# Patient Record
Sex: Female | Born: 1935
Health system: Southern US, Community
[De-identification: ages and names within clinical notes are randomized; demographics above are authoritative.]

## PROBLEM LIST (undated history)

## (undated) DIAGNOSIS — I10 Essential (primary) hypertension: Secondary | ICD-10-CM

## (undated) DIAGNOSIS — E785 Hyperlipidemia, unspecified: Secondary | ICD-10-CM

## (undated) DIAGNOSIS — F329 Major depressive disorder, single episode, unspecified: Secondary | ICD-10-CM

## (undated) DIAGNOSIS — T7840XA Allergy, unspecified, initial encounter: Secondary | ICD-10-CM

## (undated) DIAGNOSIS — D649 Anemia, unspecified: Secondary | ICD-10-CM

## (undated) DIAGNOSIS — F32A Depression, unspecified: Secondary | ICD-10-CM

## (undated) HISTORY — PX: OTHER SURGICAL HISTORY: SHX169

## (undated) HISTORY — DX: Depression, unspecified: F32.A

## (undated) HISTORY — DX: Anemia, unspecified: D64.9

## (undated) HISTORY — DX: Hyperlipidemia, unspecified: E78.5

## (undated) HISTORY — PX: CHOLECYSTECTOMY: SHX55

## (undated) HISTORY — DX: Allergy, unspecified, initial encounter: T78.40XA

## (undated) HISTORY — PX: CORNEAL TRANSPLANT: SHX108

## (undated) HISTORY — PX: DILATION AND CURETTAGE OF UTERUS: SHX78

## (undated) HISTORY — PX: RETINAL DETACHMENT SURGERY: SHX105

## (undated) HISTORY — DX: Major depressive disorder, single episode, unspecified: F32.9

## (undated) HISTORY — DX: Essential (primary) hypertension: I10

---

## 2010-08-17 ENCOUNTER — Encounter: Payer: Self-pay | Admitting: *Deleted

## 2010-10-07 ENCOUNTER — Ambulatory Visit (INDEPENDENT_AMBULATORY_CARE_PROVIDER_SITE_OTHER): Payer: Medicare Other | Admitting: Family Medicine

## 2010-10-07 ENCOUNTER — Encounter: Payer: Self-pay | Admitting: Family Medicine

## 2010-10-07 DIAGNOSIS — F329 Major depressive disorder, single episode, unspecified: Secondary | ICD-10-CM

## 2010-10-07 DIAGNOSIS — I10 Essential (primary) hypertension: Secondary | ICD-10-CM

## 2010-10-07 DIAGNOSIS — H409 Unspecified glaucoma: Secondary | ICD-10-CM

## 2010-10-07 DIAGNOSIS — H6690 Otitis media, unspecified, unspecified ear: Secondary | ICD-10-CM

## 2010-10-07 MED ORDER — AZITHROMYCIN 250 MG PO TABS: 250.0000 mg | ORAL_TABLET | Freq: Every day | ORAL | Status: AC

## 2010-10-07 MED ORDER — BENZONATATE 200 MG PO CAPS: 200.0000 mg | ORAL_CAPSULE | Freq: Three times a day (TID) | ORAL | Status: DC | PRN

## 2010-10-07 NOTE — Patient Instructions (Signed)
Schedule your complete physical at your convenience- do not eat before this appt Schedule a nurse visit for your flu shot once you're feeling better Take the Azithromycin as directed for the ear infection Use the Tessalon as needed for cough Add Mucinex to thin your congestion Drink plenty of fluids REST! Welcome!  We're glad to have you!!!

## 2010-10-07 NOTE — Progress Notes (Signed)
  Subjective:    Patient ID: Samantha Briggs, female    DOB: 10-Jun-1932, 77 y.o.   MRN: [id number]  HPI New to establish.  Recently moved from [place], [place].  HTN- chronic problem, on Norvasc, Diovan HCT.  No CP, SOB, edema, HAs, visual changes.  Well controlled.  Depression- chronic problem, sxs well controlled on Zoloft.  Has previously weaned w/ recurrence of sxs.  Glaucoma- new problem for pt, s/p cornea transplants, sees specialist at [facility].  On meds daily.  URI- R ear pain, cough productive of mucous- 'i sit and cough for hrs'.  Denies facial pain/pressure.  No fevers.  sxs started over a week ago.  + sick contacts.  Review of Systems For ROS see HPI     Objective:   Physical Exam  Vitals reviewed. Constitutional: She is oriented to person, place, and time. She appears well-developed and well-nourished. No distress.  HENT:  Head: Normocephalic and atraumatic.  Nose: Nose normal.  Mouth/Throat: Oropharynx is clear and moist. No oropharyngeal exudate.       R TM opaque, poor landmarks L TM WNL No TTP over sinuses  Eyes: Conjunctivae and EOM are normal. Pupils are equal, round, and reactive to light.  Neck: Normal range of motion. Neck supple. No thyromegaly present.  Cardiovascular: Normal rate, regular rhythm, normal heart sounds and intact distal pulses.   No murmur heard. Pulmonary/Chest: Effort normal and breath sounds normal. No respiratory distress. She has no wheezes. She has no rales.  Abdominal: Soft. She exhibits no distension. There is no tenderness.  Musculoskeletal: She exhibits no edema.  Lymphadenopathy:    She has no cervical adenopathy.  Neurological: She is alert and oriented to person, place, and time.  Skin: Skin is warm and dry.  Psychiatric: She has a normal mood and affect. Her behavior is normal.          Assessment & Plan:

## 2010-10-17 DIAGNOSIS — I10 Essential (primary) hypertension: Secondary | ICD-10-CM | POA: Insufficient documentation

## 2010-10-17 DIAGNOSIS — F418 Other specified anxiety disorders: Secondary | ICD-10-CM | POA: Insufficient documentation

## 2010-10-17 DIAGNOSIS — H409 Unspecified glaucoma: Secondary | ICD-10-CM | POA: Insufficient documentation

## 2010-10-17 DIAGNOSIS — H6690 Otitis media, unspecified, unspecified ear: Secondary | ICD-10-CM | POA: Insufficient documentation

## 2010-10-17 NOTE — Assessment & Plan Note (Signed)
R OM.  Start Azithromycin.  Reviewed supportive care and red flags that should prompt return.  Pt expressed understanding and is in agreement w/ plan.

## 2010-10-17 NOTE — Assessment & Plan Note (Signed)
Chronic issue for pt.  Has attempted to wean of SSRI previously w/ recurrence of sxs.  Reports sxs are well controlled on current dose.  No changes.

## 2010-10-17 NOTE — Assessment & Plan Note (Signed)
Chronic problem.  Asymptomatic.  Well controlled.  No changes in meds.

## 2010-10-17 NOTE — Assessment & Plan Note (Signed)
New problem for pt.  Following w/ eye specialist at [facility].  Will follow along and assist as able.

## 2010-11-04 ENCOUNTER — Ambulatory Visit (INDEPENDENT_AMBULATORY_CARE_PROVIDER_SITE_OTHER): Payer: Medicare Other | Admitting: Family Medicine

## 2010-11-04 ENCOUNTER — Encounter: Payer: Self-pay | Admitting: Family Medicine

## 2010-11-04 DIAGNOSIS — J329 Chronic sinusitis, unspecified: Secondary | ICD-10-CM

## 2010-11-04 DIAGNOSIS — R197 Diarrhea, unspecified: Secondary | ICD-10-CM

## 2010-11-04 MED ORDER — CLARITHROMYCIN ER 500 MG PO TB24: 1000.0000 mg | ORAL_TABLET | Freq: Every day | ORAL | Status: DC

## 2010-11-04 NOTE — Patient Instructions (Signed)
You have a sinus infection Take the Biaxin- 2 tabs at the same time- w/ food Start the nasal spray- 2 sprays each nostril daily Drink plenty of fluids Hold the allergy pills- this may be causing diarrhea Call with any questions or concerns Hang in there!

## 2010-11-04 NOTE — Progress Notes (Signed)
  Subjective:    Patient ID: Samantha Briggs, female    DOB: 1932/12/24, 77 y.o.   MRN: [id number]  HPI ?URI- sxs started w/ a sore throat and then progressed to cough and ear pressure.  Now nasal congestion, body aches.  No fevers.  + facial pressure.  + PND.  Nausea/Diarrhea- pt recently started OTC allergy pill.  Inactive ingredients include polyethylene glycol, magnesium.   Review of Systems For ROS see HPI     Objective:   Physical Exam  Vitals reviewed. Constitutional: She appears well-developed and well-nourished. No distress.  HENT:  Head: Normocephalic and atraumatic.  Right Ear: Tympanic membrane normal.  Left Ear: Tympanic membrane normal.  Nose: Mucosal edema and rhinorrhea present. Right sinus exhibits maxillary sinus tenderness and frontal sinus tenderness. Left sinus exhibits maxillary sinus tenderness and frontal sinus tenderness.  Mouth/Throat: Uvula is midline and mucous membranes are normal. Posterior oropharyngeal erythema present. No oropharyngeal exudate.  Eyes: Conjunctivae and EOM are normal. Pupils are equal, round, and reactive to light.  Neck: Normal range of motion. Neck supple.  Cardiovascular: Normal rate, regular rhythm and normal heart sounds.   Pulmonary/Chest: Effort normal and breath sounds normal. No respiratory distress. She has no wheezes.  Lymphadenopathy:    She has no cervical adenopathy.          Assessment & Plan:

## 2010-11-05 DIAGNOSIS — J329 Chronic sinusitis, unspecified: Secondary | ICD-10-CM | POA: Insufficient documentation

## 2010-11-05 NOTE — Assessment & Plan Note (Signed)
Pt's sxs and PE consistent w/ infxn.  Start biaxin.  Reviewed supportive care and red flags that should prompt return.  Pt expressed understanding and is in agreement w/ plan.

## 2010-11-05 NOTE — Assessment & Plan Note (Signed)
Likely multifactorial- current infxn, new medicine w/ ingredients that stimulate bowels.  Encouraged her to hold allergy med temporarily while treating infection and then rechallenge after abx course.  If diarrhea recurs- pt to stop meds indefinitely.  Pt expressed understanding and is in agreement w/ plan.

## 2010-11-08 ENCOUNTER — Ambulatory Visit: Payer: Self-pay | Admitting: Internal Medicine

## 2010-12-06 ENCOUNTER — Ambulatory Visit (INDEPENDENT_AMBULATORY_CARE_PROVIDER_SITE_OTHER): Payer: Medicare Other | Admitting: Family Medicine

## 2010-12-06 ENCOUNTER — Encounter: Payer: Self-pay | Admitting: Family Medicine

## 2010-12-06 DIAGNOSIS — R5383 Other fatigue: Secondary | ICD-10-CM | POA: Insufficient documentation

## 2010-12-06 DIAGNOSIS — L853 Xerosis cutis: Secondary | ICD-10-CM | POA: Insufficient documentation

## 2010-12-06 DIAGNOSIS — Z862 Personal history of diseases of the blood and blood-forming organs and certain disorders involving the immune mechanism: Secondary | ICD-10-CM

## 2010-12-06 DIAGNOSIS — M79609 Pain in unspecified limb: Secondary | ICD-10-CM

## 2010-12-06 DIAGNOSIS — R5381 Other malaise: Secondary | ICD-10-CM

## 2010-12-06 DIAGNOSIS — R238 Other skin changes: Secondary | ICD-10-CM

## 2010-12-06 DIAGNOSIS — M79606 Pain in leg, unspecified: Secondary | ICD-10-CM | POA: Insufficient documentation

## 2010-12-06 MED ORDER — CLONAZEPAM 0.5 MG PO TABS: 0.5000 mg | ORAL_TABLET | Freq: Two times a day (BID) | ORAL | Status: DC

## 2010-12-06 NOTE — Progress Notes (Signed)
  Subjective:    Patient ID: Forrest Moron, female    DOB: 25-Aug-1932, 77 y.o.   MRN: [id number]  HPI Leg pain- reports she had similar sxs 20 yrs ago while on vacation.  Reports intermittent 'prickly hot' feeling.  More noticeable when tired or increased activity.  sxs worse at night.  Has constant need to move legs.  Will have cramping.  sxs bilaterally.  Increased fatigue- reports hx of iron deficiency anemia.  Has had increased activity lately w/ excessive shopping, waiting in long lines, poor sleep.  Chewing on ice.  Dry skin- very itchy, taking benadryl prn.   Review of Systems For ROS see HPI     Objective:   Physical Exam  Vitals reviewed. Constitutional: She is oriented to person, place, and time. She appears well-developed and well-nourished. No distress.  HENT:  Head: Normocephalic and atraumatic.  Eyes: Conjunctivae and EOM are normal. Pupils are equal, round, and reactive to light.  Neck: Normal range of motion. Neck supple. No thyromegaly present.  Cardiovascular: Normal rate, regular rhythm, normal heart sounds and intact distal pulses.   No murmur heard. Pulmonary/Chest: Effort normal and breath sounds normal. No respiratory distress.  Abdominal: Soft. She exhibits no distension. There is no tenderness.  Musculoskeletal: She exhibits no edema and no tenderness.  Lymphadenopathy:    She has no cervical adenopathy.  Neurological: She is alert and oriented to person, place, and time.  Skin: Skin is warm and dry.  Psychiatric: She has a normal mood and affect. Her behavior is normal.          Assessment & Plan:

## 2010-12-06 NOTE — Patient Instructions (Signed)
We'll notify you of your lab results and decide the next step Make sure you keep your skin well moisturized (try Aveeno) Increase your water intake Take the Klonopin nightly for restless leg Call with any questions or concerns Happy Holidays!

## 2010-12-07 LAB — IBC PANEL
Iron: 32 ug/dL — ABNORMAL LOW (ref 42–145)
Transferrin: 430.6 mg/dL — ABNORMAL HIGH (ref 212.0–360.0)

## 2010-12-07 LAB — BASIC METABOLIC PANEL
BUN: 18 mg/dL (ref 6–23)
CO2: 23 mEq/L (ref 19–32)
Calcium: 8.7 mg/dL (ref 8.4–10.5)
Chloride: 105 mEq/L (ref 96–112)
Creatinine, Ser: 1 mg/dL (ref 0.4–1.2)
GFR: 60.07 mL/min (ref >60.00)
Glucose, Bld: 135 mg/dL — ABNORMAL HIGH (ref 70–99)
Potassium: 3.8 mEq/L (ref 3.5–5.1)
Sodium: 140 mEq/L (ref 135–145)

## 2010-12-07 LAB — CBC WITH DIFFERENTIAL/PLATELET
Basophils Relative: 0.1 % (ref 0.0–3.0)
Eosinophils Relative: 1.3 % (ref 0.0–5.0)
MCV: 71.8 fl — ABNORMAL LOW (ref 78.0–100.0)
Monocytes Absolute: 0.5 10*3/uL (ref 0.1–1.0)
Monocytes Relative: 9.2 % (ref 3.0–12.0)
Neutrophils Relative %: 43.7 % (ref 43.0–77.0)
Platelets: 249 10*3/uL (ref 150.0–400.0)
RBC: 4.57 Mil/uL (ref 3.87–5.11)
WBC: 5.9 10*3/uL (ref 4.5–10.5)

## 2010-12-07 LAB — TSH: TSH: 4.12 u[IU]/mL (ref 0.35–5.50)

## 2010-12-08 ENCOUNTER — Ambulatory Visit (HOSPITAL_BASED_OUTPATIENT_CLINIC_OR_DEPARTMENT_OTHER)
Admission: RE | Admit: 2010-12-08 | Discharge: 2010-12-08 | Disposition: A | Discharge status: Home / Self Care | Payer: Medicare Other | Source: Ambulatory Visit | Attending: Family Medicine | Admitting: Family Medicine

## 2010-12-08 ENCOUNTER — Encounter: Payer: Self-pay | Admitting: Family Medicine

## 2010-12-08 ENCOUNTER — Encounter: Payer: Self-pay | Admitting: *Deleted

## 2010-12-08 ENCOUNTER — Ambulatory Visit (INDEPENDENT_AMBULATORY_CARE_PROVIDER_SITE_OTHER): Payer: Medicare Other | Admitting: Family Medicine

## 2010-12-08 VITALS — BP 125/80 | HR 95 | Temp 99.9°F | Ht 67.0 in | Wt 218.4 lb

## 2010-12-08 DIAGNOSIS — R059 Cough, unspecified: Secondary | ICD-10-CM

## 2010-12-08 DIAGNOSIS — R05 Cough: Secondary | ICD-10-CM

## 2010-12-08 MED ORDER — BENZONATATE 200 MG PO CAPS: 200.0000 mg | ORAL_CAPSULE | Freq: Three times a day (TID) | ORAL | Status: AC | PRN

## 2010-12-08 MED ORDER — FERROUS SULFATE 325 (65 FE) MG PO TABS: 325.0000 mg | ORAL_TABLET | Freq: Every day | ORAL | Status: DC

## 2010-12-08 NOTE — Progress Notes (Signed)
  Subjective:    Patient ID: Samantha Briggs, female    DOB: 1932/02/28, 77 y.o.   MRN: [id number]  HPI Cough- sxs started yesterday.  Coughed all night, + chest congestion.  + body aches, HA.  No fevers at home.  + sick contacts.  No ear pain, sinus pressure.   Review of Systems For ROS see HPI     Objective:   Physical Exam  Vitals reviewed. Constitutional: She appears well-developed and well-nourished. No distress.  HENT:  Head: Normocephalic and atraumatic.       TMs normal bilaterally Mild nasal congestion Throat w/out erythema, edema, or exudate  Eyes: Conjunctivae and EOM are normal. Pupils are equal, round, and reactive to light.  Neck: Normal range of motion. Neck supple.  Cardiovascular: Normal rate, regular rhythm, normal heart sounds and intact distal pulses.   No murmur heard. Pulmonary/Chest: Effort normal and breath sounds normal. No respiratory distress. She has no wheezes.       + hacking cough  Lymphadenopathy:    She has no cervical adenopathy.          Assessment & Plan:

## 2010-12-08 NOTE — Assessment & Plan Note (Signed)
Pt's sxs consistent w/ flu like illness but rapid flu was negative.  Will send for CXR prior to starting abx.  Cough meds given.  Will determine next step based on xray results

## 2010-12-08 NOTE — Patient Instructions (Signed)
Go to [address] to get your xray- we'll call you with the results Use the cough meds as needed Alternate tylenol and ibuprofen for body aches and fever REST! Drink plenty of fluids Hang in there!

## 2010-12-10 ENCOUNTER — Telehealth: Payer: Self-pay

## 2010-12-10 MED ORDER — AZITHROMYCIN 250 MG PO TABS: ORAL_TABLET | ORAL | Status: DC

## 2010-12-10 NOTE — Telephone Encounter (Signed)
Call from patient and she was diagnosed with a viral infection, she stated she still has a bad cough with discolored mucus and wheezing and she wanted to get something.  Denied a fever.    KP

## 2010-12-10 NOTE — Telephone Encounter (Signed)
Pt had a negative chest xray 2 days ago.  Viral infections typically last 7-10 days and often worsen before they improve (w/ day 3-5 typically being the worst).  Change in mucous does not indicate bacterial infxn.  Since the weekend is coming, will give Zpack but there is a chance this will not improve her sxs.  Should be taking Mucinex to thin her congestion.  If increased wheezing or shortness of breath she should go to ER, UC or Saturday clinic

## 2010-12-10 NOTE — Telephone Encounter (Signed)
Left detailed message for pt ; Pt had a negative chest xray 2 days ago. Viral infections typically last 7-10 days and often worsen before they improve (w/ day 3-5 typically being the worst). Change in mucous does not indicate bacterial infxn. Since the weekend is coming, will give Zpack but there is a chance this will not improve her sxs. Should be taking Mucinex to thin her congestion. If increased wheezing or shortness of breath she should go to ER, UC or Saturday clinic advised for pt to call office with any questions. Sent z-pack to pharmacy in chart

## 2010-12-14 ENCOUNTER — Telehealth: Payer: Self-pay | Admitting: *Deleted

## 2010-12-14 NOTE — Assessment & Plan Note (Signed)
Most likely seasonal.  Needs increased moisturizing.  Pt expressed understanding and is in agreement w/ plan.

## 2010-12-14 NOTE — Assessment & Plan Note (Signed)
Pt's sxs consistent w/ restless leg.  Start klonopin nightly to improve sxs.  Pt to call if no improvement.  Pt expressed understanding and is in agreement w/ plan.

## 2010-12-14 NOTE — Telephone Encounter (Signed)
Pt left vm to ask when she can expect her cough to subside and when she can have the flu vaccine. Spoke to pt to pt and she understood and will continue with her ABT and call in to schedule for her flu vaccine per verbal order from MD Beverely Low

## 2010-12-14 NOTE — Assessment & Plan Note (Signed)
Pt's sxs consistent w/ iron deficiency anemia.  Check labs to confirm.  Will determine next step based on lab results.  Reviewed supportive care and red flags that should prompt return.  Pt expressed understanding and is in agreement w/ plan.

## 2010-12-16 ENCOUNTER — Telehealth: Payer: Self-pay | Admitting: *Deleted

## 2010-12-16 NOTE — Telephone Encounter (Signed)
Is she taking these on an empty stomach?  If so- she should try taking them w/ food.

## 2010-12-16 NOTE — Telephone Encounter (Signed)
Noted 30 minutes after she takes iron pills her hands shake, her stomach feels funny and she sweats. Should she stop taking or take something different?

## 2010-12-16 NOTE — Telephone Encounter (Signed)
.  left message to have patient return my call.  

## 2010-12-21 ENCOUNTER — Ambulatory Visit: Payer: Medicare Other

## 2010-12-21 ENCOUNTER — Telehealth: Payer: Self-pay | Admitting: Family Medicine

## 2010-12-21 ENCOUNTER — Other Ambulatory Visit (INDEPENDENT_AMBULATORY_CARE_PROVIDER_SITE_OTHER): Payer: Medicare Other

## 2010-12-21 DIAGNOSIS — N390 Urinary tract infection, site not specified: Secondary | ICD-10-CM

## 2010-12-21 LAB — POCT URINALYSIS DIPSTICK
Bilirubin, UA: NEGATIVE
Glucose, UA: NEGATIVE
Ketones, UA: NEGATIVE
Nitrite, UA: NEGATIVE

## 2010-12-21 MED ORDER — CEPHALEXIN 500 MG PO CAPS: 500.0000 mg | ORAL_CAPSULE | Freq: Two times a day (BID) | ORAL | Status: AC

## 2010-12-21 MED ORDER — FLUCONAZOLE 150 MG PO TABS: 150.0000 mg | ORAL_TABLET | Freq: Once | ORAL | Status: DC

## 2010-12-21 NOTE — Telephone Encounter (Signed)
Last OV 12-08-10

## 2010-12-21 NOTE — Telephone Encounter (Signed)
Pt came in for office visit and I asked if she had received my message about the iron supplement, pt advised that she did and thinks she was not eating enough food and will call if any concerns start up again per has not noted any issues since she started eating more food

## 2010-12-21 NOTE — Telephone Encounter (Signed)
Patient states that she was given antibiotics at last visit and now has a yeast infection and is having trouble urinating.

## 2010-12-21 NOTE — Telephone Encounter (Signed)
Spoke to pt to advise results/instructions. Pt understood. Scheduled pt to come in today for a urine sample Sent diflucan in for pt to pick up pt is aware.

## 2010-12-21 NOTE — Telephone Encounter (Signed)
Is she able to come in an leave a urine sample? We can call in diflucan 150  #2  1 po qd x1, may repeat in 2 days prn

## 2010-12-22 ENCOUNTER — Ambulatory Visit: Payer: Medicare Other

## 2010-12-22 ENCOUNTER — Telehealth: Payer: Self-pay | Admitting: Family Medicine

## 2010-12-22 NOTE — Telephone Encounter (Signed)
.  left message to have patient return my call. Advised on vm to NOT take the flu shot per still on antibiotics

## 2010-12-22 NOTE — Telephone Encounter (Signed)
Patient states that she has appt this afternoon for flu shot. She has a bladder and yeast infection and would like to know if she should wait to get her flu shot?

## 2010-12-23 ENCOUNTER — Encounter: Payer: Self-pay | Admitting: *Deleted

## 2010-12-23 ENCOUNTER — Ambulatory Visit (INDEPENDENT_AMBULATORY_CARE_PROVIDER_SITE_OTHER): Payer: Medicare Other | Admitting: Family Medicine

## 2010-12-23 ENCOUNTER — Encounter: Payer: Self-pay | Admitting: Family Medicine

## 2010-12-23 ENCOUNTER — Other Ambulatory Visit: Payer: Self-pay | Admitting: *Deleted

## 2010-12-23 ENCOUNTER — Ambulatory Visit (HOSPITAL_BASED_OUTPATIENT_CLINIC_OR_DEPARTMENT_OTHER)
Admission: RE | Admit: 2010-12-23 | Discharge: 2010-12-23 | Disposition: A | Discharge status: Home / Self Care | Payer: Medicare Other | Source: Ambulatory Visit | Attending: Family Medicine | Admitting: Family Medicine

## 2010-12-23 DIAGNOSIS — R05 Cough: Secondary | ICD-10-CM

## 2010-12-23 DIAGNOSIS — R197 Diarrhea, unspecified: Secondary | ICD-10-CM

## 2010-12-23 DIAGNOSIS — N390 Urinary tract infection, site not specified: Secondary | ICD-10-CM | POA: Insufficient documentation

## 2010-12-23 DIAGNOSIS — R042 Hemoptysis: Secondary | ICD-10-CM

## 2010-12-23 DIAGNOSIS — I517 Cardiomegaly: Secondary | ICD-10-CM

## 2010-12-23 DIAGNOSIS — R059 Cough, unspecified: Secondary | ICD-10-CM | POA: Insufficient documentation

## 2010-12-23 MED ORDER — LEVOFLOXACIN 500 MG PO TABS: 500.0000 mg | ORAL_TABLET | Freq: Every day | ORAL | Status: AC

## 2010-12-23 MED ORDER — GUAIFENESIN-CODEINE 100-10 MG/5ML PO SYRP: 10.0000 mL | ORAL_SOLUTION | Freq: Three times a day (TID) | ORAL | Status: DC | PRN

## 2010-12-23 NOTE — Progress Notes (Signed)
  Subjective:    Patient ID: Samantha Briggs, female    DOB: 10-12-1932, 77 y.o.   MRN: [id number]  HPI Cough- reports she is now spitting up blood.  No fevers.  + nasal congestion, pressure- clear drainage.  No ear pain.  Had recent normal CXR.  Diarrhea- started when taking iron.  Pt reports she has had this problem since having gallbladder out.  UTI- UA and prelim cx show infxn.  On Keflex 500mg  bid.   Review of Systems For ROS see HPI     Objective:   Physical Exam  Vitals reviewed. Constitutional: She appears well-developed and well-nourished. No distress.  HENT:  Head: Normocephalic and atraumatic.       TMs normal bilaterally Mild nasal congestion Throat w/out erythema, edema, or exudate  Eyes: Conjunctivae and EOM are normal. Pupils are equal, round, and reactive to light.  Neck: Normal range of motion. Neck supple.  Cardiovascular: Normal rate, regular rhythm, normal heart sounds and intact distal pulses.   No murmur heard. Pulmonary/Chest: Effort normal and breath sounds normal. No respiratory distress. She has no wheezes.       + hacking cough  Lymphadenopathy:    She has no cervical adenopathy.          Assessment & Plan:

## 2010-12-23 NOTE — Patient Instructions (Signed)
[facility] to [facility] Use the cough syrup as needed- this may make you sleepy Continue the Keflex for the bladder infection Add mucinex to thin your congestion Drink plenty of fluids REST! Hang in there!!! Happy Holidays!

## 2010-12-23 NOTE — Telephone Encounter (Signed)
rx sent to pharmacy by e-script  

## 2010-12-24 ENCOUNTER — Telehealth: Payer: Self-pay

## 2010-12-24 LAB — URINE CULTURE: Colony Count: >=100000

## 2010-12-24 NOTE — Telephone Encounter (Signed)
Call from patient and she stated she was given Levaquin and it kept her up all night and she wanted to get something milder, she felt like the Levaquin was too strong. Please advise       KP

## 2010-12-24 NOTE — Telephone Encounter (Signed)
Discussed with patient and she voiced understanding.      KP 

## 2010-12-24 NOTE — Telephone Encounter (Signed)
The antibiotics would not have kept her up all night- especially after 1 dose.  Sometimes codeine can have the opposite effect on people and rather than making them sleepy- it can make them hyper.  There is no reason to change abx at this time.

## 2011-01-09 NOTE — Assessment & Plan Note (Signed)
Continue keflex for now.  If CXR shows lung process will switch abx to cover both urine and chest.  Pt expressed understanding and is in agreement w/ plan.

## 2011-01-09 NOTE — Assessment & Plan Note (Signed)
Pt feels this is due to her recent restart of iron.  Given her multiple abx recently will hold iron and restart once she is feeling better.  Pt expressed understanding and is in agreement w/ plan.

## 2011-01-09 NOTE — Assessment & Plan Note (Signed)
Deteriorated.  Given reports of blood tinged sputum will get CXR to assess.  Hold on abx decision until results available.  Pt expressed understanding and is in agreement w/ plan.

## 2011-02-22 ENCOUNTER — Telehealth: Payer: Self-pay | Admitting: Family Medicine

## 2011-02-22 NOTE — Telephone Encounter (Signed)
Patient states she is being treated at [facility] and as of January this clinic is no longer in-network for her insurance with [facility]. [employer] has requested that in order for her treatment to be covered, they need Dr. Beverely Low to call ([phone]) and confirm that the patient should be treated at [facility]. Patient has an appointment on this Thursday 2-21 and has requested a quick response.

## 2011-02-23 ENCOUNTER — Telehealth: Payer: Self-pay | Admitting: *Deleted

## 2011-02-23 NOTE — Telephone Encounter (Signed)
Pt called to advise that she has an apt on 02-24-11 with MD Allingham at [facility] in [place] [phone]), pt advised that while transitioning from [facility], her MD at that time referred her to MD Allingham per she was to be moving to [place], she has established care and has been treated by MD Allingham for the entire year of 2012 and now he is no longer accepted from her insurance provider [employer] for 2013, pt is requesting that MD Tabori either call or send "word" to MD Allingham office that she is the pt PCP and advises that she can still receive care from MD Allingham, I asked if the pt is speaking of a referral, the pt stated yes. I advised that MD Beverely Low will be in the office this afternoon and that I will speak to her about the matter and give her a call back. Pt understood.

## 2011-02-23 NOTE — Telephone Encounter (Signed)
I placed call to [facility], was transferred to financial/insurance department, had to leave voicemail for [facility].  I explained on her voicemail the situation, and asked her to return my call and advise what they need from [place].

## 2011-02-23 NOTE — Telephone Encounter (Signed)
Per call back from [facility] with [facility], she did not tell the patient what the patient called and told us.  There must have some confusion is what she understood what was explained to her by [facility].  Per Delores, they do not need anything from [place], the patient has no "out of network" benefits, so if she continues her care with them, it will be completely out of pocket for her.    Per my call to patient to explain, she states it was not [employer] who needed a call from [place], it's her [employer], Medicare Completed.  The providers network department told patient, since she is already established & has pre-existing care with [facility], that our physician can call them at [phone], and they could make an exception, and pay for patient to continue care and have her benefits paid as normal.

## 2011-02-23 NOTE — Telephone Encounter (Signed)
It would make more sense for the specialist to call and get her this exception.  I'm not sure what info I have to offer since I'm not aware of her treatment plan.

## 2011-02-23 NOTE — Telephone Encounter (Signed)
Called pt and advised instructions per deloris from [facility], pt understood and will call the insurance company after her apt with the eye center tommorow, pt understood that she will have to pay out of pocket.

## 2011-02-23 NOTE — Telephone Encounter (Signed)
Not sure why pt would need a referral if she is already established w/ MD.  A referral from me will do nothing to change her insurance status.  I will be happy to send a referral asking him to continue her care but this will not change the $ situation.

## 2011-02-23 NOTE — Telephone Encounter (Signed)
Please advise 

## 2011-03-08 ENCOUNTER — Telehealth: Payer: Self-pay | Admitting: Family Medicine

## 2011-03-08 NOTE — Telephone Encounter (Signed)
rx refill for   sertraline (ZOLOFT) 50 MG tablet   Qty 30 Last fill date 1.21.13  [id number], fax # [phone]

## 2011-03-08 NOTE — Telephone Encounter (Signed)
RX refill for  Valsartan-HCTZ 320-25MG  Tab 30/each

## 2011-03-08 NOTE — Telephone Encounter (Signed)
Ok for #30, 6 refills 

## 2011-03-09 MED ORDER — VALSARTAN-HYDROCHLOROTHIAZIDE 320-25 MG PO TABS: 1.0000 | ORAL_TABLET | Freq: Every day | ORAL | Status: DC

## 2011-03-09 MED ORDER — SERTRALINE HCL 50 MG PO TABS: 50.0000 mg | ORAL_TABLET | Freq: Every day | ORAL | Status: DC

## 2011-03-09 NOTE — Telephone Encounter (Signed)
rx sent to pharmacy by e-script for zoloft

## 2011-03-09 NOTE — Telephone Encounter (Signed)
rx sent to pharmacy by e-script  

## 2011-03-09 NOTE — Telephone Encounter (Signed)
Addended by: Derry Lory A on: 03/09/2011 05:03 PM   Modules accepted: Orders

## 2011-03-21 ENCOUNTER — Telehealth: Payer: Self-pay | Admitting: Family Medicine

## 2011-03-21 NOTE — Telephone Encounter (Signed)
Refill: Amlodipine besylate 2.5mg  tab. Take 1 tablet by mouth every day. Qty 30. Last fill 2.14.13

## 2011-03-22 MED ORDER — AMLODIPINE BESYLATE 2.5 MG PO TABS: 2.5000 mg | ORAL_TABLET | Freq: Every day | ORAL | Status: DC

## 2011-04-21 ENCOUNTER — Telehealth: Payer: Self-pay | Admitting: Family Medicine

## 2011-04-21 MED ORDER — FERROUS SULFATE 325 (65 FE) MG PO TABS: 325.0000 mg | ORAL_TABLET | Freq: Every day | ORAL | Status: DC

## 2011-04-21 NOTE — Telephone Encounter (Signed)
Patient wants to know if she should come back in for a visit since she has been on iron pills and has not been in since 12/2010 Please advise & I can call to make appointment PH#[phone]

## 2011-04-21 NOTE — Telephone Encounter (Signed)
rx sent to pharmacy by e-script per MD Beverely Low, please advise [facility] [address]

## 2011-04-22 NOTE — Telephone Encounter (Signed)
Advised patient regarding RX and no need to be seen yet.

## 2011-05-03 ENCOUNTER — Encounter: Payer: Self-pay | Admitting: Internal Medicine

## 2011-05-03 ENCOUNTER — Ambulatory Visit (INDEPENDENT_AMBULATORY_CARE_PROVIDER_SITE_OTHER): Payer: Medicare Other | Admitting: Internal Medicine

## 2011-05-03 VITALS — BP 132/82 | HR 84 | Temp 98.2°F | Ht 67.5 in | Wt 214.2 lb

## 2011-05-03 DIAGNOSIS — M549 Dorsalgia, unspecified: Secondary | ICD-10-CM

## 2011-05-03 LAB — POCT URINALYSIS DIPSTICK
Bilirubin, UA: NEGATIVE
Ketones, UA: NEGATIVE
Leukocytes, UA: NEGATIVE
Nitrite, UA: POSITIVE

## 2011-05-03 MED ORDER — CYCLOBENZAPRINE HCL 10 MG PO TABS: 10.0000 mg | ORAL_TABLET | Freq: Every evening | ORAL | Status: AC | PRN

## 2011-05-03 NOTE — Patient Instructions (Signed)
Rest, no heavy lifting Tylenol 500 mg over-the-counter, 2 tablets every 6 hours as needed. Flexeril, a muscle relaxant, at night. Local heat Call if you get worse, you have fever or more stomach symptoms. Also call if not better in a few days.

## 2011-05-03 NOTE — Progress Notes (Signed)
  Subjective:    Patient ID: Samantha Briggs, female    DOB: [birth year], 78 y.o.   MRN: [id number]  HPI Acute visit Developed a back pain 3 days ago, initially on off, now is more steady. Located at the mid thoracic area, slightly worse on the right. Pain does not change when she bends-twist her torso or when she eats. Feels  like a spasm. No radiation, has not taken any medication for the pain. She also has occasional dry heaves however denies any heartburn, dysphagia or odynophagia. She noted that burping and rubbing the back makes the pain better.   Past Medical History  Diagnosis Date  . Anemia   . Depression   . Glaucoma   . Allergy   . Hypertension      Review of Systems No vomiting, no blood in the stools. Occasional diarrhea which is nothing new for her. No abdominal pain. No dysuria gross hematuria. Has not taken any recent antibiotics by mouth but rather strong eyedrop antibiotics.    Objective:   Physical Exam General -- alert, well-developed, and overweight appearing. No apparent distress.   abdomen--soft, non-tender, no distention, no masses, no HSM, no guarding, and no rigidity.   Extremities-- no pretibial edema bilaterally  Back-- no tender to palpation at the thoracic of lumbar spine, skin normal, question of right-sided CVA tenderness. Neurologic-- alert & oriented X3 and strength normal in all extremities. Psych-- Cognition and judgment appear intact. Alert and cooperative with normal attention span and concentration.  not anxious appearing and not depressed appearing.      Assessment & Plan:   Back pain: Has some atypical symptoms like decreasing with burping, no urinary symptoms however the urine dip shows some leukocytes. Plan: Treat as a muscular issue Send a urine culture Call if not better in  Few days. See  instructions.

## 2011-05-05 ENCOUNTER — Telehealth: Payer: Self-pay | Admitting: Family Medicine

## 2011-05-05 LAB — CULTURE, URINE COMPREHENSIVE

## 2011-05-05 MED ORDER — CEPHALEXIN 500 MG PO CAPS: 500.0000 mg | ORAL_CAPSULE | Freq: Two times a day (BID) | ORAL | Status: AC

## 2011-05-05 NOTE — Telephone Encounter (Signed)
Tried to call Pt no answer and VM not set up will try again later.

## 2011-05-05 NOTE — Progress Notes (Signed)
Addended by: Derry Lory A on: 05/05/2011 01:51 PM   Modules accepted: Orders

## 2011-05-05 NOTE — Telephone Encounter (Signed)
Start OTC prilosec 20 mg 1 po qd, arrange a OV for tomorrow, needs to be reassessed

## 2011-05-05 NOTE — Telephone Encounter (Signed)
Pt states that symptoms are not as intense but have change very little since OV. Marland KitchenPlease advise

## 2011-05-05 NOTE — Telephone Encounter (Signed)
Still having problems with gas pains, has seen Dr.Pax this week. Wants to know if there is anything at all that can be done to comfort her problem Patient ph# [phone]

## 2011-05-06 NOTE — Telephone Encounter (Signed)
Tried to call Pt No answer, VM not set up will try again later.

## 2011-05-08 ENCOUNTER — Emergency Department (HOSPITAL_BASED_OUTPATIENT_CLINIC_OR_DEPARTMENT_OTHER)
Admission: EM | Admit: 2011-05-08 | Discharge: 2011-05-08 | Disposition: A | Discharge status: Home / Self Care | Payer: Medicare Other | Attending: Emergency Medicine | Admitting: Emergency Medicine

## 2011-05-08 ENCOUNTER — Encounter (HOSPITAL_BASED_OUTPATIENT_CLINIC_OR_DEPARTMENT_OTHER): Payer: Self-pay | Admitting: Emergency Medicine

## 2011-05-08 DIAGNOSIS — F329 Major depressive disorder, single episode, unspecified: Secondary | ICD-10-CM | POA: Insufficient documentation

## 2011-05-08 DIAGNOSIS — Z87891 Personal history of nicotine dependence: Secondary | ICD-10-CM | POA: Insufficient documentation

## 2011-05-08 DIAGNOSIS — Z9089 Acquired absence of other organs: Secondary | ICD-10-CM | POA: Insufficient documentation

## 2011-05-08 DIAGNOSIS — Z7982 Long term (current) use of aspirin: Secondary | ICD-10-CM | POA: Insufficient documentation

## 2011-05-08 DIAGNOSIS — F3289 Other specified depressive episodes: Secondary | ICD-10-CM | POA: Insufficient documentation

## 2011-05-08 DIAGNOSIS — D649 Anemia, unspecified: Secondary | ICD-10-CM | POA: Insufficient documentation

## 2011-05-08 DIAGNOSIS — B029 Zoster without complications: Secondary | ICD-10-CM | POA: Insufficient documentation

## 2011-05-08 DIAGNOSIS — Z885 Allergy status to narcotic agent status: Secondary | ICD-10-CM | POA: Insufficient documentation

## 2011-05-08 DIAGNOSIS — H409 Unspecified glaucoma: Secondary | ICD-10-CM | POA: Insufficient documentation

## 2011-05-08 DIAGNOSIS — I10 Essential (primary) hypertension: Secondary | ICD-10-CM | POA: Insufficient documentation

## 2011-05-08 MED ORDER — PREDNISONE 20 MG PO TABS: ORAL_TABLET | ORAL | Status: AC

## 2011-05-08 MED ORDER — ONDANSETRON HCL 8 MG PO TABS: 8.0000 mg | ORAL_TABLET | Freq: Three times a day (TID) | ORAL | Status: AC | PRN

## 2011-05-08 MED ORDER — VALACYCLOVIR HCL 1 G PO TABS: 1000.0000 mg | ORAL_TABLET | Freq: Two times a day (BID) | ORAL | Status: DC

## 2011-05-08 MED ORDER — OXYCODONE-ACETAMINOPHEN 5-325 MG PO TABS
1.0000 | ORAL_TABLET | ORAL | Status: AC | PRN
Start: 2011-05-08 — End: 2011-05-18

## 2011-05-08 NOTE — Discharge Instructions (Signed)

## 2011-05-08 NOTE — ED Provider Notes (Signed)
History     CSN: [id number]  Arrival date & time 05/08/11  4782   First MD Initiated Contact with Patient 05/08/11 [phone]      Chief Complaint  Patient presents with  . Back Pain  . Rash    (Consider location/radiation/quality/duration/timing/severity/associated sxs/prior treatment) Patient is a 78 y.o. female presenting with rash. The history is provided by the patient.  Rash  This is a new problem. The current episode started yesterday (pain for the last week but rash appeared yesterday). The problem has been gradually worsening. The problem is associated with nothing. There has been no fever. The rash is present on the torso. The pain is at a severity of 8/10. The pain is severe. The pain has been constant since onset. Associated symptoms include blisters and pain. She has tried nothing for the symptoms. The treatment provided no relief.    Past Medical History  Diagnosis Date  . Anemia   . Depression   . Glaucoma   . Allergy   . Hypertension     Past Surgical History  Procedure Date  . Cholecystectomy   . Dilation and curettage of uterus   . Retinal detachment surgery   . Corneal transplant     x2    No family history on file.  History  Substance Use Topics  . Smoking status: Former Games developer  . Smokeless tobacco: Not on file  . Alcohol Use: No    OB History    Grav Para Term Preterm Abortions TAB SAB Ect Mult Living                  Review of Systems  Constitutional: Negative for fever and chills.  Respiratory: Negative for cough, chest tightness and shortness of breath.   Gastrointestinal: Positive for nausea. Negative for vomiting and diarrhea.  Genitourinary: Negative for dysuria.  Skin: Positive for rash.  All other systems reviewed and are negative.    Allergies  Morphine and related  Home Medications   Current Outpatient Rx  Name Route Sig Dispense Refill  . AMLODIPINE BESYLATE 2.5 MG PO TABS Oral Take 1 tablet (2.5 mg total) by mouth daily.  30 tablet 2  . ASPIRIN 81 MG PO TABS Oral Take 81 mg by mouth daily.      . CEPHALEXIN 500 MG PO CAPS Oral Take 1 capsule (500 mg total) by mouth 2 (two) times daily. 10 capsule 0  . CLONAZEPAM 0.5 MG PO TABS Oral Take 1 tablet (0.5 mg total) by mouth 2 (two) times daily. 60 tablet 0  . CYCLOBENZAPRINE HCL 10 MG PO TABS Oral Take 1 tablet (10 mg total) by mouth at bedtime as needed for muscle spasms. 15 tablet 0  . FERROUS SULFATE 325 (65 FE) MG PO TABS Oral Take 1 tablet (325 mg total) by mouth daily with breakfast. 30 tablet 11  . LATANOPROST 0.005 % OP SOLN Left Eye Place 1 drop into the left eye daily.     Marland Kitchen OMEPRAZOLE 20 MG PO CPDR Oral Take 20 mg by mouth daily.      Marland Kitchen PRED MILD 0.12 % OP SUSP Both Eyes Place 1 drop into both eyes.     . SERTRALINE HCL 50 MG PO TABS Oral Take 1 tablet (50 mg total) by mouth daily. 30 tablet 6  . VALSARTAN-HYDROCHLOROTHIAZIDE 320-25 MG PO TABS Oral Take 1 tablet by mouth daily. 30 tablet 3    BP 132/72  Pulse 82  Temp(Src) 97.5 F (36.4 C) (  Oral)  Resp 16  SpO2 98%  Physical Exam  Nursing note and vitals reviewed. Constitutional: She is oriented to person, place, and time. She appears well-developed and well-nourished. No distress.  HENT:  Head: Normocephalic and atraumatic.  Eyes: EOM are normal. Pupils are equal, round, and reactive to light.  Cardiovascular: Normal rate, regular rhythm, normal heart sounds and intact distal pulses.  Exam reveals no friction rub.   No murmur heard. Pulmonary/Chest: Effort normal and breath sounds normal. She has no wheezes. She has no rales.  Musculoskeletal: Normal range of motion. She exhibits no tenderness.       No edema  Neurological: She is alert and oriented to person, place, and time. No cranial nerve deficit.  Skin: Skin is warm and dry. Rash noted. Rash is vesicular. There is erythema.          Vesicular, weeping, erythematous, painful rash in T10 dermatome  Psychiatric: She has a normal mood and  affect. Her behavior is normal.    ED Course  Procedures (including critical care time)  Labs Reviewed - No data to display No results found.   1. Shingles       MDM    Pt with evidence of shingles in the T10 dermatome.  No other associated sx.  Will treat with steroids, acyclovir and pain control.     Gwyneth Sprout, MD 05/08/11 0830

## 2011-05-08 NOTE — ED Notes (Signed)
Pt c/o back pain since last Fri, now c/o RT side rash to trunk since yesterday, appears consistent w/ shingles

## 2011-05-09 ENCOUNTER — Telehealth: Payer: Self-pay | Admitting: Family Medicine

## 2011-05-09 NOTE — Telephone Encounter (Signed)
Noted  

## 2011-05-09 NOTE — Telephone Encounter (Signed)
Caller: Dmya/Patient; PCP: Sheliah Hatch.; CB#: [phone]; Call regarding Shingles; Dx on 05/08/11 at ED.  Emergent sx ruled out.  Home care and parameters for callback given.  Caller voiced understanding and agreement.

## 2011-05-16 ENCOUNTER — Telehealth: Payer: Self-pay

## 2011-05-16 MED ORDER — VALACYCLOVIR HCL 1 G PO TABS: 1000.0000 mg | ORAL_TABLET | Freq: Three times a day (TID) | ORAL | Status: DC

## 2011-05-16 MED ORDER — HYDROCODONE-ACETAMINOPHEN 5-500 MG PO TABS: 1.0000 | ORAL_TABLET | Freq: Four times a day (QID) | ORAL | Status: AC | PRN

## 2011-05-16 NOTE — Telephone Encounter (Signed)
Call from patient and she is scheduled for a hospital follow up on Wed for Shingles but she stated the shingles has not improved and she is not getting any sleep at night. She was on prednisone for 5 days along with Valtrex 1 gram bid. Patient would like to know if she cn come in today instead of waiting until Wednesday. discussed with Dr.Tabori and she advised to take Valtrex 1 gm tid x's 7 and Hydrocodone 5-500 1 po q6h # 20. Discussed with patient and she is ok with that, she will keep her apt for Wednesday.      KP

## 2011-05-17 NOTE — Telephone Encounter (Signed)
Spoke with Pt who states that she has since been Dx with shingle and this is what was still causing her discomfort.

## 2011-05-17 NOTE — Telephone Encounter (Signed)
Thank you :)

## 2011-05-18 ENCOUNTER — Ambulatory Visit (INDEPENDENT_AMBULATORY_CARE_PROVIDER_SITE_OTHER): Payer: Medicare Other | Admitting: Family Medicine

## 2011-05-18 ENCOUNTER — Encounter: Payer: Self-pay | Admitting: Family Medicine

## 2011-05-18 VITALS — BP 126/82 | HR 100 | Temp 98.5°F | Ht 66.0 in | Wt 210.6 lb

## 2011-05-18 DIAGNOSIS — G4700 Insomnia, unspecified: Secondary | ICD-10-CM

## 2011-05-18 DIAGNOSIS — B029 Zoster without complications: Secondary | ICD-10-CM | POA: Insufficient documentation

## 2011-05-18 MED ORDER — TRAZODONE HCL 50 MG PO TABS: ORAL_TABLET | ORAL | Status: DC

## 2011-05-18 NOTE — Progress Notes (Signed)
  Subjective:    Patient ID: Samantha Briggs, female    DOB: [birth year], 78 y.o.   MRN: [id number]  HPI Shingles- dx'd in ER, started on Valtrex.  Taking pain pills w/out relief.  Unable to sleep due to pain.  Has multiple questions- when will i feel better?  When can i get the shingles shot?  Can i have something for sleep?   Review of Systems For ROS see HPI     Objective:   Physical Exam  Vitals reviewed. Constitutional: She appears well-developed and well-nourished. No distress.  Skin: Skin is warm and dry. Rash (dermatomal rash on R abdomen consistent w/ shingles) noted.          Assessment & Plan:

## 2011-05-18 NOTE — Patient Instructions (Signed)
Take the Trazodone as needed for sleep Apply aloe to skin to speed w/ healing and prevent itching Call with any questions or concerns Hang in there!!!

## 2011-05-22 NOTE — Assessment & Plan Note (Signed)
New.  Due to pain and discomfort of shingles.  Start trazodone to assist w/ insomnia.  Will follow.

## 2011-05-22 NOTE — Assessment & Plan Note (Signed)
New.  Taking appropriate dose of Valtrex.  Discussed that course is variable for each person.  Lesions appear to be healing well.  Reviewed supportive care and red flags that should prompt return.  Pt expressed understanding and is in agreement w/ plan.

## 2011-05-25 ENCOUNTER — Telehealth: Payer: Self-pay | Admitting: Family Medicine

## 2011-05-25 NOTE — Telephone Encounter (Signed)
Caller: Samantha Briggs/Patient is calling with a question about Shingles Vaccine.The medication was written by Sheliah Hatch  Called to ask when can take Shingles vaccination as she is currently recovering from shingles and still has lingering pain.  Lesions drying up. Per CDC website, advised "even if you have had shingles, you can still receive the shingles vaccine to help prevent future occurrences of the disease. There is no specific time that you must wait after having shingles before receiving the shingles vaccine. The decision on when to get vaccinated should be made with your health care provider. Generally, a person should make sure that the shingles rash has disappeared before getting vaccinated."  States will call for vaccine appt when rash is gone. Health info provided for caller with med question that was answered with available resources per Med Questions Call Guideline.

## 2011-05-25 NOTE — Telephone Encounter (Signed)
Noted.  Agree w/ advice given.  Told pt to wait until rash was gone

## 2011-06-02 ENCOUNTER — Encounter: Payer: Self-pay | Admitting: Family Medicine

## 2011-06-02 ENCOUNTER — Ambulatory Visit (INDEPENDENT_AMBULATORY_CARE_PROVIDER_SITE_OTHER): Payer: Medicare Other | Admitting: Family Medicine

## 2011-06-02 VITALS — BP 125/78 | HR 100 | Temp 98.3°F | Ht 66.0 in | Wt 209.6 lb

## 2011-06-02 DIAGNOSIS — R140 Abdominal distension (gaseous): Secondary | ICD-10-CM

## 2011-06-02 DIAGNOSIS — Z23 Encounter for immunization: Secondary | ICD-10-CM

## 2011-06-02 DIAGNOSIS — B0229 Other postherpetic nervous system involvement: Secondary | ICD-10-CM

## 2011-06-02 DIAGNOSIS — Z2911 Encounter for prophylactic immunotherapy for respiratory syncytial virus (RSV): Secondary | ICD-10-CM

## 2011-06-02 DIAGNOSIS — R141 Gas pain: Secondary | ICD-10-CM

## 2011-06-02 DIAGNOSIS — B029 Zoster without complications: Secondary | ICD-10-CM

## 2011-06-02 MED ORDER — GABAPENTIN 300 MG PO CAPS: 300.0000 mg | ORAL_CAPSULE | Freq: Three times a day (TID) | ORAL | Status: DC

## 2011-06-02 NOTE — Patient Instructions (Signed)
Start the Neurontin nightly x3 days and then increase to twice daily x3 days and then increase to 3x/day for the pain Start Gas-X for the gas/bloating Try and increase your activity Call with any questions or concerns Hang in there!

## 2011-06-02 NOTE — Progress Notes (Signed)
  Subjective:    Patient ID: Samantha Briggs, female    DOB: Aug 08, 1932, 78 y.o.   MRN: [id number]  HPI Shingles- pain still radiating around R flank, 'like lightning'.  Pain has not improved despite rash clearing.  + numbness/burning.  Rash is drying, appears to be healing well.  Wants vaccine.  Gas/Bloating- noted since she has been limiting her movement w/ the shingles, worse w/ bending over- will belch.  No N/V/D.  + distention.  No constipation.  No hx of similar.  No changes to diet.   Review of Systems For ROS see HPI     Objective:   Physical Exam  Vitals reviewed. Constitutional: She is oriented to person, place, and time. She appears well-developed and well-nourished. No distress.  Cardiovascular: Normal rate, regular rhythm and normal heart sounds.   Pulmonary/Chest: Effort normal and breath sounds normal. No respiratory distress. She has no wheezes. She has no rales.  Abdominal: Soft. Bowel sounds are normal. She exhibits distension (mild). There is no tenderness. There is no rebound.  Musculoskeletal: She exhibits no edema.  Neurological: She is alert and oriented to person, place, and time.  Skin: Skin is warm and dry. Rash (well healing, scabs present) noted.  Psychiatric: She has a normal mood and affect. Her behavior is normal.          Assessment & Plan:

## 2011-06-06 ENCOUNTER — Telehealth: Payer: Self-pay | Admitting: Family Medicine

## 2011-06-06 NOTE — Telephone Encounter (Signed)
Called pt to advise, she notes that she will stop taking and and wait for the issue to resolve as discussed in OV, removed neuronin from medication list and added to allergy list

## 2011-06-06 NOTE — Telephone Encounter (Signed)
Caller: Moselle/Patient; PCP: Sheliah Hatch.; CB#: [phone]. Call regarding ?drug Reaction.  Caller who has shingles reports she was seen in the office on Thurs 5/30 for ongoing discomfort r/t nerve pain. MD started her Neurontin to take at hs. Caller reports after 1st dose Friday 5/31 evening, she noticed she was itching. Hive like rash and severe itching Sat night after 2nd dose of medication. Last dose of Medication was taken Sat 6/1 at hs. She is also having some nausea and loose stools that began on Sunday 6/2. Caller feels these sxs are r/t Neurontin and would like PCP to be aware. Caller given home care/dietary info to help firm stools. She would like a callback AFTER 2pm today, as she has errands to run this am.

## 2011-06-06 NOTE — Telephone Encounter (Signed)
Sounds as if pt had allergic rxn to neurontin.  Should stop med and not resume.

## 2011-06-12 DIAGNOSIS — R140 Abdominal distension (gaseous): Secondary | ICD-10-CM | POA: Insufficient documentation

## 2011-06-12 NOTE — Assessment & Plan Note (Signed)
New.  Start neurontin to better control pain.  Reviewed titration schedule.  Reviewed supportive care and red flags that should prompt return.  Pt expressed understanding and is in agreement w/ plan.

## 2011-06-12 NOTE — Assessment & Plan Note (Signed)
Improving- scabs now present.  Ok for vaccine- given today.

## 2011-06-12 NOTE — Assessment & Plan Note (Signed)
New.  Likely due to decreased movement since developing shingles due to pain.  Encouraged increased movement and Gas-X prn.  Pt expressed understanding and is in agreement w/ plan.

## 2011-06-15 ENCOUNTER — Telehealth: Payer: Self-pay | Admitting: Family Medicine

## 2011-06-15 MED ORDER — AMLODIPINE BESYLATE 2.5 MG PO TABS: 2.5000 mg | ORAL_TABLET | Freq: Every day | ORAL | Status: DC

## 2011-06-15 NOTE — Telephone Encounter (Signed)
Refill: Amlodipine besylate 2.5mg  tab. Take 1 tablet by mouth once a day. Qty 30. Last fill 05-14-11

## 2011-06-15 NOTE — Telephone Encounter (Signed)
rx sent to pharmacy by e-script  

## 2011-06-17 ENCOUNTER — Telehealth: Payer: Self-pay | Admitting: Family Medicine

## 2011-06-17 ENCOUNTER — Encounter (HOSPITAL_BASED_OUTPATIENT_CLINIC_OR_DEPARTMENT_OTHER): Payer: Self-pay | Admitting: *Deleted

## 2011-06-17 ENCOUNTER — Emergency Department (HOSPITAL_BASED_OUTPATIENT_CLINIC_OR_DEPARTMENT_OTHER)
Admission: EM | Admit: 2011-06-17 | Discharge: 2011-06-17 | Disposition: A | Discharge status: Home / Self Care | Payer: Medicare Other | Attending: Emergency Medicine | Admitting: Emergency Medicine

## 2011-06-17 DIAGNOSIS — Z87891 Personal history of nicotine dependence: Secondary | ICD-10-CM | POA: Insufficient documentation

## 2011-06-17 DIAGNOSIS — I10 Essential (primary) hypertension: Secondary | ICD-10-CM

## 2011-06-17 DIAGNOSIS — Z79899 Other long term (current) drug therapy: Secondary | ICD-10-CM | POA: Insufficient documentation

## 2011-06-17 DIAGNOSIS — R42 Dizziness and giddiness: Secondary | ICD-10-CM

## 2011-06-17 DIAGNOSIS — D649 Anemia, unspecified: Secondary | ICD-10-CM | POA: Insufficient documentation

## 2011-06-17 LAB — CBC
MCH: 28.8 pg (ref 26.0–34.0)
Platelets: 224 10*3/uL (ref 150–400)
RBC: 4.55 MIL/uL (ref 3.87–5.11)
RDW: 14.6 % (ref 11.5–15.5)

## 2011-06-17 LAB — URINALYSIS, ROUTINE W REFLEX MICROSCOPIC
Bilirubin Urine: NEGATIVE
Hgb urine dipstick: NEGATIVE
Ketones, ur: NEGATIVE mg/dL
Nitrite: NEGATIVE
Protein, ur: NEGATIVE mg/dL
Specific Gravity, Urine: 1.006 (ref 1.005–1.030)
Urobilinogen, UA: 0.2 mg/dL (ref 0.0–1.0)

## 2011-06-17 LAB — BASIC METABOLIC PANEL
Calcium: 9.4 mg/dL (ref 8.4–10.5)
Creatinine, Ser: 0.9 mg/dL (ref 0.50–1.10)
GFR calc non Af Amer: 61 mL/min — ABNORMAL LOW (ref >90)
Glucose, Bld: 122 mg/dL — ABNORMAL HIGH (ref 70–99)
Sodium: 139 mEq/L (ref 135–145)

## 2011-06-17 LAB — TROPONIN I: Troponin I: <0.3 ng/mL (ref <0.30)

## 2011-06-17 MED ORDER — ZOLPIDEM TARTRATE 5 MG PO TABS: 5.0000 mg | ORAL_TABLET | Freq: Every evening | ORAL | Status: DC | PRN

## 2011-06-17 NOTE — Discharge Instructions (Signed)
Your symptoms may be related to elevated blood pressure. Sometimes medications can react such as trazodone and other antidepressants. You should stop your trazodone and follow up with your primary care doctor in the next few days. You may increase your norvasc (amlodipine) to 5 mg if blood pressure remains >140/90.  You may try low dose ambien for sleep, but monitor for side effects and discontinue if not tolerated. If your symptoms worsen, you have chest pain, shortness of breath or passing out then return to the emergency room or call your doctor.   Arterial Hypertension Arterial hypertension (high blood pressure) is a condition of elevated pressure in your blood vessels. Hypertension over a long period of time is a risk factor for strokes, heart attacks, and heart failure. It is also the leading cause of kidney (renal) failure.  CAUSES   In Adults -- Over 90% of all hypertension has no known cause. This is called essential or primary hypertension. In the other 10% of people with hypertension, the increase in blood pressure is caused by another disorder. This is called secondary hypertension. Important causes of secondary hypertension are:   Heavy alcohol use.   Obstructive sleep apnea.   Hyperaldosterosim (Conn's syndrome).   Steroid use.   Chronic kidney failure.   Hyperparathyroidism.   Medications.   Renal artery stenosis.   Pheochromocytoma.   Cushing's disease.   Coarctation of the aorta.   Scleroderma renal crisis.   Licorice (in excessive amounts).   Drugs (cocaine, methamphetamine).  Your caregiver can explain any items above that apply to you.  In Children -- Secondary hypertension is more common and should always be considered.   Pregnancy -- Few women of childbearing age have high blood pressure. However, up to 10% of them develop hypertension of pregnancy. Generally, this will not harm the woman. It may be a sign of 3 complications of pregnancy:  preeclampsia, HELLP syndrome, and eclampsia. Follow up and control with medication is necessary.  SYMPTOMS   This condition normally does not produce any noticeable symptoms. It is usually found during a routine exam.   Malignant hypertension is a late problem of high blood pressure. It may have the following symptoms:   Headaches.   Blurred vision.   End-organ damage (this means your kidneys, heart, lungs, and other organs are being damaged).   Stressful situations can increase the blood pressure. If a person with normal blood pressure has their blood pressure go up while being seen by their caregiver, this is often termed "white coat hypertension." Its importance is not known. It may be related with eventually developing hypertension or complications of hypertension.   Hypertension is often confused with mental tension, stress, and anxiety.  DIAGNOSIS  The diagnosis is made by 3 separate blood pressure measurements. They are taken at least 1 week apart from each other. If there is organ damage from hypertension, the diagnosis may be made without repeat measurements. Hypertension is usually identified by having blood pressure readings:  Above 140/90 mmHg measured in both arms, at 3 separate times, over a couple weeks.   Over 130/80 mmHg should be considered a risk factor and may require treatment in patients with diabetes.  Blood pressure readings over 120/80 mmHg are called "pre-hypertension" even in non-diabetic patients. To get a true blood pressure measurement, use the following guidelines. Be aware of the factors that can alter blood pressure readings.  Take measurements at least 1 hour after caffeine.   Take measurements 30 minutes after smoking and  without any stress. This is another reason to quit smoking - it raises your blood pressure.   Use a proper cuff size. Ask your caregiver if you are not sure about your cuff size.   Most home blood pressure cuffs are automatic. They  will measure systolic and diastolic pressures. The systolic pressure is the pressure reading at the start of sounds. Diastolic pressure is the pressure at which the sounds disappear. If you are elderly, measure pressures in multiple postures. Try sitting, lying or standing.   Sit at rest for a minimum of 5 minutes before taking measurements.   You should not be on any medications like decongestants. These are found in many cold medications.   Record your blood pressure readings and review them with your caregiver.  If you have hypertension:  Your caregiver may do tests to be sure you do not have secondary hypertension (see "causes" above).   Your caregiver may also look for signs of metabolic syndrome. This is also called Syndrome X or Insulin Resistance Syndrome. You may have this syndrome if you have type 2 diabetes, abdominal obesity, and abnormal blood lipids in addition to hypertension.   Your caregiver will take your medical and family history and perform a physical exam.   Diagnostic tests may include blood tests (for glucose, cholesterol, potassium, and kidney function), a urinalysis, or an EKG. Other tests may also be necessary depending on your condition.  PREVENTION  There are important lifestyle issues that you can adopt to reduce your chance of developing hypertension:  Maintain a normal weight.   Limit the amount of salt (sodium) in your diet.   Exercise often.   Limit alcohol intake.   Get enough potassium in your diet. Discuss specific advice with your caregiver.   Follow a DASH diet (dietary approaches to stop hypertension). This diet is rich in fruits, vegetables, and low-fat dairy products, and avoids certain fats.  PROGNOSIS  Essential hypertension cannot be cured. Lifestyle changes and medical treatment can lower blood pressure and reduce complications. The prognosis of secondary hypertension depends on the underlying cause. Many people whose hypertension is  controlled with medicine or lifestyle changes can live a normal, healthy life.  RISKS AND COMPLICATIONS  While high blood pressure alone is not an illness, it often requires treatment due to its short- and long-term effects on many organs. Hypertension increases your risk for:  CVAs or strokes (cerebrovascular accident).   Heart failure due to chronically high blood pressure (hypertensive cardiomyopathy).   Heart attack (myocardial infarction).   Damage to the retina (hypertensive retinopathy).   Kidney failure (hypertensive nephropathy).  Your caregiver can explain list items above that apply to you. Treatment of hypertension can significantly reduce the risk of complications. TREATMENT   For overweight patients, weight loss and regular exercise are recommended. Physical fitness lowers blood pressure.   Mild hypertension is usually treated with diet and exercise. A diet rich in fruits and vegetables, fat-free dairy products, and foods low in fat and salt (sodium) can help lower blood pressure. Decreasing salt intake decreases blood pressure in a 1/3 of people.   Stop smoking if you are a smoker.  The steps above are highly effective in reducing blood pressure. While these actions are easy to suggest, they are difficult to achieve. Most patients with moderate or severe hypertension end up requiring medications to bring their blood pressure down to a normal level. There are several classes of medications for treatment. Blood pressure pills (antihypertensives) will lower  blood pressure by their different actions. Lowering the blood pressure by 10 mmHg may decrease the risk of complications by as much as 25%. The goal of treatment is effective blood pressure control. This will reduce your risk for complications. Your caregiver will help you determine the best treatment for you according to your lifestyle. What is excellent treatment for one person, may not be for you. HOME CARE INSTRUCTIONS    Do not smoke.   Follow the lifestyle changes outlined in the "Prevention" section.   If you are on medications, follow the directions carefully. Blood pressure medications must be taken as prescribed. Skipping doses reduces their benefit. It also puts you at risk for problems.   Follow up with your caregiver, as directed.   If you are asked to monitor your blood pressure at home, follow the guidelines in the "Diagnosis" section above.  SEEK MEDICAL CARE IF:   You think you are having medication side effects.   You have recurrent headaches or lightheadedness.   You have swelling in your ankles.   You have trouble with your vision.  SEEK IMMEDIATE MEDICAL CARE IF:   You have sudden onset of chest pain or pressure, difficulty breathing, or other symptoms of a heart attack.   You have a severe headache.   You have symptoms of a stroke (such as sudden weakness, difficulty speaking, difficulty walking).  MAKE SURE YOU:   Understand these instructions.   Will watch your condition.   Will get help right away if you are not doing well or get worse.  Document Released: 12/20/2004 Document Revised: 12/09/2010 Document Reviewed: 07/20/2006 [facility] Patient Information 2012 [place], [place].

## 2011-06-17 NOTE — ED Notes (Signed)
Pt c/o increased stress x 1 month with dizziness x 2 days

## 2011-06-17 NOTE — ED Provider Notes (Signed)
History     CSN: [id number]  Arrival date & time 06/17/11  1538   First MD Initiated Contact with Patient 06/17/11 1557      Chief Complaint  Patient presents with  . Dizziness  . Depression    (Consider location/radiation/quality/duration/timing/severity/associated sxs/prior treatment) HPI Comments: 78 yo female with history of HTN, shingles presents with multiple vague complaints for past 2 days. States she is very stressed being the caretaker of her husband with alzheimers and having multiple medical issues including corneal transplants and recent shingles outbreak causing pain. She complains mainly of "not feeling like herself," but also specifically notes some lightheadedness with bending over, mild frontal headache, palpitations, clamminess and elevation in BP from 160-180 systolic (typical is 120-130s). Her symptoms are worse when she becomes upset/anxious. Her daughters give parts of the history also. Patient states she became very emotional today after rushing around outside and climbing a flight of steps. In the exam room, she states she feels almost back to normal after calming down and speaking with her family and the nurse. Denies chest pain, dyspnea, cough, le edema, dysuria, abdominal pain, fever.  She has a family history of cardiovascular disease. Her only recent medication changes include starting trazadone for sleep/depression and completion of prednisone (for shingles).     Past Medical History  Diagnosis Date  . Anemia   . Depression   . Glaucoma   . Allergy   . Hypertension     Past Surgical History  Procedure Date  . Cholecystectomy   . Dilation and curettage of uterus   . Retinal detachment surgery   . Corneal transplant     x2    History reviewed. No pertinent family history.  History  Substance Use Topics  . Smoking status: Former Games developer  . Smokeless tobacco: Not on file  . Alcohol Use: No    OB History    Grav Para Term Preterm Abortions  TAB SAB Ect Mult Living                  Review of Systems  Constitutional: Positive for fatigue. Negative for fever, chills and diaphoresis.  HENT: Negative for congestion and neck pain.   Eyes: Negative for pain.  Respiratory: Negative for cough, chest tightness and shortness of breath.   Cardiovascular: Positive for palpitations. Negative for chest pain and leg swelling.  Gastrointestinal: Negative for abdominal distention.  Genitourinary: Negative for dysuria, urgency and difficulty urinating.  Musculoskeletal: Negative for back pain and gait problem.  Neurological: Positive for dizziness, light-headedness and headaches. Negative for seizures, syncope and weakness.  Psychiatric/Behavioral: Positive for dysphoric mood.  All other systems reviewed and are negative.    Allergies  Morphine and related and Neurontin  Home Medications   Current Outpatient Rx  Name Route Sig Dispense Refill  . AMLODIPINE BESYLATE 2.5 MG PO TABS Oral Take 1 tablet (2.5 mg total) by mouth daily. 30 tablet 2  . ASPIRIN 81 MG PO TABS Oral Take 81 mg by mouth daily.      . CEPHALEXIN 500 MG PO CAPS      . CLONAZEPAM 0.5 MG PO TABS Oral Take 1 tablet (0.5 mg total) by mouth 2 (two) times daily. 60 tablet 0  . CYCLOBENZAPRINE HCL 10 MG PO TABS      . FERROUS SULFATE 325 (65 FE) MG PO TABS Oral Take 1 tablet (325 mg total) by mouth daily with breakfast. 30 tablet 11  . HYDROCODONE-ACETAMINOPHEN 5-500 MG PO TABS      .  LATANOPROST 0.005 % OP SOLN Left Eye Place 1 drop into the left eye daily.     Marland Kitchen OMEPRAZOLE 20 MG PO CPDR Oral Take 20 mg by mouth daily.      Marland Kitchen ONDANSETRON HCL 8 MG PO TABS      . OXYCODONE-ACETAMINOPHEN 5-325 MG PO TABS      . PRED MILD 0.12 % OP SUSP Both Eyes Place 1 drop into both eyes.     Marland Kitchen PREDNISONE 20 MG PO TABS      . SERTRALINE HCL 50 MG PO TABS Oral Take 1 tablet (50 mg total) by mouth daily. 30 tablet 6  . TRAZODONE HCL 50 MG PO TABS  1/2-1 tab nightly prn for sleep 30 tablet  0  . VALACYCLOVIR HCL 1 G PO TABS Oral Take 1 tablet (1,000 mg total) by mouth 3 (three) times daily. 21 tablet 0  . VALSARTAN-HYDROCHLOROTHIAZIDE 320-25 MG PO TABS Oral Take 1 tablet by mouth daily. 30 tablet 3    BP 163/90  Pulse 102  Temp 97.7 F (36.5 C) (Oral)  Resp 18  Ht 5\' 7"  (1.702 m)  Wt 200 lb (90.719 kg)  BMI 31.32 kg/m2  SpO2 99%  Physical Exam  Vitals reviewed. Constitutional: She is oriented to person, place, and time. She appears well-developed and well-nourished. No distress.  HENT:  Head: Normocephalic and atraumatic.  Right Ear: External ear normal.  Left Ear: External ear normal.  Mouth/Throat: Oropharynx is clear and moist. No oropharyngeal exudate.  Eyes: Conjunctivae are normal. Pupils are equal, round, and reactive to light.  Neck: Neck supple. No thyromegaly present.  Cardiovascular: Normal rate, normal heart sounds and intact distal pulses.  Exam reveals no gallop.   No murmur heard. Pulmonary/Chest: Effort normal and breath sounds normal. No respiratory distress. She has no wheezes. She has no rales.  Abdominal: Soft. Bowel sounds are normal. She exhibits no distension. There is no tenderness. There is no rebound and no guarding.  Musculoskeletal: She exhibits no edema and no tenderness.  Lymphadenopathy:    She has no cervical adenopathy.  Neurological: She is alert and oriented to person, place, and time. No cranial nerve deficit. She exhibits normal muscle tone. Coordination normal.  Skin: She is not diaphoretic. No pallor.       Faint erythematous lesion on right flank from healing shingles.  Psychiatric: Her behavior is normal.       Patient becomes tearful when recalling her stressors. Otherwise euthymic.     ED Course  Procedures (including critical care time)   Date: 06/17/2011  Rate: 90  Rhythm: normal sinus rhythm  QRS Axis: normal  Intervals: normal  ST/T Wave abnormalities: nonspecific T wave changes and flattening in V1, Q waves  anteriorly  Conduction Disutrbances:none  Narrative Interpretation:   Old EKG Reviewed: none available   Labs Reviewed  BASIC METABOLIC PANEL - Abnormal; Notable for the following:    Potassium 3.4 (*)     Glucose, Bld 122 (*)     GFR calc non Af Amer 61 (*)     GFR calc Af Amer 70 (*)     All other components within normal limits  CBC  TROPONIN I  URINALYSIS, ROUTINE W REFLEX MICROSCOPIC   Results for orders placed during the hospital encounter of 06/17/11 (from the past 24 hour(s))  BASIC METABOLIC PANEL     Status: Abnormal   Collection Time   06/17/11  4:39 PM      Component Value Range  Sodium 139  135 - 145 mEq/L   Potassium 3.4 (*) 3.5 - 5.1 mEq/L   Chloride 100  96 - 112 mEq/L   CO2 28  19 - 32 mEq/L   Glucose, Bld 122 (*) 70 - 99 mg/dL   BUN 16  6 - 23 mg/dL   Creatinine, Ser 5.78  0.50 - 1.10 mg/dL   Calcium 9.4  8.4 - 46.9 mg/dL   GFR calc non Af Amer 61 (*) >90 mL/min   GFR calc Af Amer 70 (*) >90 mL/min  CBC     Status: Normal   Collection Time   06/17/11  4:39 PM      Component Value Range   WBC 9.4  4.0 - 10.5 K/uL   RBC 4.55  3.87 - 5.11 MIL/uL   Hemoglobin 13.1  12.0 - 15.0 g/dL   HCT 62.9  52.8 - 41.3 %   MCV 85.1  78.0 - 100.0 fL   MCH 28.8  26.0 - 34.0 pg   MCHC 33.9  30.0 - 36.0 g/dL   RDW 24.4  01.0 - 27.2 %   Platelets 224  150 - 400 K/uL  TROPONIN I     Status: Normal   Collection Time   06/17/11  4:39 PM      Component Value Range   Troponin I <0.30  <0.30 ng/mL  URINALYSIS, ROUTINE W REFLEX MICROSCOPIC     Status: Normal   Collection Time   06/17/11  5:39 PM      Component Value Range   Color, Urine YELLOW  YELLOW   APPearance CLEAR  CLEAR   Specific Gravity, Urine 1.006  1.005 - 1.030   pH 6.5  5.0 - 8.0   Glucose, UA NEGATIVE  NEGATIVE mg/dL   Hgb urine dipstick NEGATIVE  NEGATIVE   Bilirubin Urine NEGATIVE  NEGATIVE   Ketones, ur NEGATIVE  NEGATIVE mg/dL   Protein, ur NEGATIVE  NEGATIVE mg/dL   Urobilinogen, UA 0.2  0.0 - 1.0  mg/dL   Nitrite NEGATIVE  NEGATIVE   Leukocytes, UA NEGATIVE  NEGATIVE   No results found.   1. Hypertension   2. Episodic lightheadedness       MDM  78 yo female with history of HTN, shingles, depression presents with multiple vague complaints including headache, and lightheadedness found to have decompensated hypertension to sys 160s from baseline 120s. This could be secondary to emotional stress, but patient seems calm at time of interview and endorses several days of sustained hypertension. Orthostatics are negative. Basic labs, EKG, trop negative. This may represent interaction of trazodone with zoloft. Will advise patient to stop trazodone, may use low dose ambien for sleep. Discussed possible side effects with patient, discontinuation for any concern. Advised f/u with PCP in next 3-7 days for medication and BP check.          Durwin Reges, MD 06/17/11 [phone]

## 2011-06-17 NOTE — Telephone Encounter (Signed)
Caller: Samantha Briggs/Patient; PCP: Sheliah Hatch.; CB#: [phone]; Call regarding following completion of shingles treatment, new onset  06/16/11 of being lightheaded. Per CSR: "Pt Hung Up Angry Before Completing Demographics" Symptoms reviewed Dizziness, clammy feeling, with headache,  pale in color, stopped iron supplements, with symptoms, pt advised needs to be seen within 24 hrs;  pt's family prefers to take to [facility].

## 2011-06-17 NOTE — Telephone Encounter (Signed)
Confused by note- does pt need to be seen or is she going to ER?

## 2011-06-18 NOTE — ED Provider Notes (Signed)
I  reviewed the resident's note and I agree with the findings and plan.     Nelia Shi, MD 06/18/11 [phone]

## 2011-06-21 ENCOUNTER — Encounter: Payer: Self-pay | Admitting: Family Medicine

## 2011-06-21 ENCOUNTER — Ambulatory Visit (INDEPENDENT_AMBULATORY_CARE_PROVIDER_SITE_OTHER): Payer: Medicare Other | Admitting: Family Medicine

## 2011-06-21 VITALS — BP 140/90 | HR 99 | Temp 98.2°F | Ht 65.75 in | Wt 201.6 lb

## 2011-06-21 DIAGNOSIS — F341 Dysthymic disorder: Secondary | ICD-10-CM

## 2011-06-21 DIAGNOSIS — F418 Other specified anxiety disorders: Secondary | ICD-10-CM

## 2011-06-21 DIAGNOSIS — G4700 Insomnia, unspecified: Secondary | ICD-10-CM

## 2011-06-21 DIAGNOSIS — R1013 Epigastric pain: Secondary | ICD-10-CM | POA: Insufficient documentation

## 2011-06-21 DIAGNOSIS — R112 Nausea with vomiting, unspecified: Secondary | ICD-10-CM

## 2011-06-21 DIAGNOSIS — R197 Diarrhea, unspecified: Secondary | ICD-10-CM

## 2011-06-21 LAB — HEPATIC FUNCTION PANEL
ALT: 26 U/L (ref 0–35)
Albumin: 4.2 g/dL (ref 3.5–5.2)
Bilirubin, Direct: 0.1 mg/dL (ref 0.0–0.3)
Total Protein: 7.5 g/dL (ref 6.0–8.3)

## 2011-06-21 LAB — LIPASE: Lipase: 28 U/L (ref 11.0–59.0)

## 2011-06-21 LAB — AMYLASE: Amylase: 54 U/L (ref 27–131)

## 2011-06-21 LAB — H. PYLORI ANTIBODY, IGG: H Pylori IgG: NEGATIVE

## 2011-06-21 MED ORDER — ONDANSETRON HCL 4 MG PO TABS: 4.0000 mg | ORAL_TABLET | Freq: Three times a day (TID) | ORAL | Status: DC | PRN

## 2011-06-21 MED ORDER — CLONAZEPAM 0.5 MG PO TABS: 0.5000 mg | ORAL_TABLET | Freq: Two times a day (BID) | ORAL | Status: DC | PRN

## 2011-06-21 MED ORDER — ZOLPIDEM TARTRATE 5 MG PO TABS: 5.0000 mg | ORAL_TABLET | Freq: Every evening | ORAL | Status: DC | PRN

## 2011-06-21 NOTE — Patient Instructions (Addendum)
Follow up in 1 month to check anxiety Start the Klonopin twice daily for anxiety Use a 1/2 tab of Ambien for sleep Use the Zofran as needed for nausea We'll notify you of your lab results Call with any questions or concerns Hang in there!!!

## 2011-06-21 NOTE — Progress Notes (Signed)
  Subjective:    Patient ID: Samantha Briggs, female    DOB: Nov 07, 1932, 78 y.o.   MRN: [id number]  HPI ER f/u- went to ER on 6/14 for lightheadedness and other 'vague symptoms'.  Trazodone was 'working beautifully- i was sleeping great!'.  ER diagnosed interaction between Trazodone and Zoloft- ? Serotonin syndrome.  Had normal cardiac w/u.  No longer having palpitations.  + anorexia since developing shingles.  No longer sleeping since stopping Trazodone despite taking Tylenol PM.  + anxiety.  Notes diarrhea and nausea are worse w/ anxiety.   Review of Systems For ROS see HPI     Objective:   Physical Exam  Nursing note and vitals reviewed. Constitutional: She is oriented to person, place, and time. She appears well-developed and well-nourished. No distress.  HENT:  Head: Normocephalic and atraumatic.  Eyes: Conjunctivae and EOM are normal. Pupils are equal, round, and reactive to light.  Neck: Normal range of motion. Neck supple. No thyromegaly present.  Cardiovascular: Normal rate, regular rhythm, normal heart sounds and intact distal pulses.   No murmur heard. Pulmonary/Chest: Effort normal and breath sounds normal. No respiratory distress.  Abdominal: Soft. Bowel sounds are normal. She exhibits no distension. There is no tenderness. There is no rebound and no guarding.  Musculoskeletal: She exhibits no edema.  Lymphadenopathy:    She has no cervical adenopathy.  Neurological: She is alert and oriented to person, place, and time.  Skin: Skin is warm and dry.  Psychiatric: Her behavior is normal.       Anxious!          Assessment & Plan:

## 2011-06-22 ENCOUNTER — Encounter: Payer: Self-pay | Admitting: *Deleted

## 2011-06-23 ENCOUNTER — Encounter: Payer: Self-pay | Admitting: *Deleted

## 2011-06-23 ENCOUNTER — Ambulatory Visit: Payer: Medicare Other | Admitting: Family Medicine

## 2011-07-12 NOTE — Assessment & Plan Note (Signed)
Deteriorated since stopping Trazodone.  Start Klonopin for anxiety, ambien prn.  Will follow.

## 2011-07-12 NOTE — Assessment & Plan Note (Signed)
Deteriorated.  Pt preoccupied w/ recent health issues.  Start klonopin prn.  Will follow.

## 2011-07-12 NOTE — Assessment & Plan Note (Signed)
New.  Check labs.  Suspect this is due to IBS which has worsened w/ recent anxiety.  tx anxiety and see if sxs improve.  Will follow.

## 2011-07-12 NOTE — Assessment & Plan Note (Signed)
New.  Suspect this is a component of IBS- worsened by recent anxiety.  Check labs to r/o H pylori, pancreatitis although this seems unlikely.  Reviewed supportive care and red flags that should prompt return.  Pt expressed understanding and is in agreement w/ plan.

## 2011-07-20 ENCOUNTER — Other Ambulatory Visit: Payer: Self-pay | Admitting: Family Medicine

## 2011-07-20 MED ORDER — VALSARTAN-HYDROCHLOROTHIAZIDE 320-25 MG PO TABS: 1.0000 | ORAL_TABLET | Freq: Every day | ORAL | Status: DC

## 2011-07-20 NOTE — Telephone Encounter (Signed)
VALSARTAN-HCTZ 320-25 MG TABLET QTY: 30 REFILLS: 3 TAKE 1 TABLET BY MOUTH EVERY DAY

## 2011-07-26 ENCOUNTER — Encounter: Payer: Self-pay | Admitting: Family Medicine

## 2011-07-26 ENCOUNTER — Ambulatory Visit (INDEPENDENT_AMBULATORY_CARE_PROVIDER_SITE_OTHER): Payer: Medicare Other | Admitting: Family Medicine

## 2011-07-26 VITALS — BP 124/78 | HR 94 | Temp 98.2°F | Ht 65.5 in | Wt 197.4 lb

## 2011-07-26 DIAGNOSIS — R112 Nausea with vomiting, unspecified: Secondary | ICD-10-CM

## 2011-07-26 DIAGNOSIS — Z636 Dependent relative needing care at home: Secondary | ICD-10-CM

## 2011-07-26 DIAGNOSIS — E669 Obesity, unspecified: Secondary | ICD-10-CM

## 2011-07-26 DIAGNOSIS — Z6379 Other stressful life events affecting family and household: Secondary | ICD-10-CM

## 2011-07-26 NOTE — Assessment & Plan Note (Signed)
New.  Pt to seek support group/counseling due to husband's dementia.  Discussed her guilt when she leaves him to do activities and how she must continue to live her life.  Total time spent w/ pt and family- >30 minutes, >50% spent counseling.

## 2011-07-26 NOTE — Assessment & Plan Note (Signed)
New.  Pt is not eating well and is often stress eating due to husband's illness.  Admits she is 'clueless' when it comes to how to eat.  Would like some direction.  Will refer to nutrition.

## 2011-07-26 NOTE — Progress Notes (Signed)
  Subjective:    Patient ID: Samantha Briggs, female    DOB: 06-21-1932, 79 y.o.   MRN: [id number]  HPI Loose stools- has always been a problem, worsened after gall bladder removal- particularly w/ fatty foods.  'whatever she eats goes right through her' per daughter.  Granddaughter worries pt isn't eating enough.  Stools are loose, yellow.  No new or different meds.  Recent colonoscopy was normal.  Pt denies fevers, vomiting, labs all normal.  Admits to poor eating- highly processed and fatty foods.  High anxiety due to husband's dementia.   Review of Systems For ROS see HPI     Objective:   Physical Exam  Vitals reviewed. Constitutional: She appears well-developed and well-nourished.  Abdominal: Soft. She exhibits no distension. There is no tenderness. There is no rebound and no guarding.  Psychiatric:       Tearful when discussing husband          Assessment & Plan:

## 2011-07-26 NOTE — Assessment & Plan Note (Signed)
Vomiting has improved but pt continues to have diarrhea.  Had long discussion w/ pt, daughter, and granddaughter that this is most likely due to poor eating habits and anxiety.  All recent labs and studies have been normal.  Pt is eating fatty and overly processed foods which she knows causes her diarrhea.  Is also extremely stressed about husband's dementia.  Will refer pt to nutrition to improve her diet and discussed stress management techniques.  Pt and family appreciative.

## 2011-07-26 NOTE — Patient Instructions (Addendum)
We'll call you with the nutrition appt Try and stop worrying! Take advantage of counseling- it will help! Consider a daily walk to clear your head and get your exercise Healthy food choices!  High fiber, low fat- lots of fruits and veggies! Call with any questions or concerns Hang in there!!!

## 2011-07-27 ENCOUNTER — Telehealth: Payer: Self-pay | Admitting: Family Medicine

## 2011-07-27 DIAGNOSIS — F418 Other specified anxiety disorders: Secondary | ICD-10-CM

## 2011-07-27 MED ORDER — CLONAZEPAM 0.5 MG PO TABS: 0.5000 mg | ORAL_TABLET | Freq: Two times a day (BID) | ORAL | Status: DC | PRN

## 2011-07-27 NOTE — Telephone Encounter (Signed)
.  rx faxed to pharmacy, manually.  

## 2011-07-27 NOTE — Telephone Encounter (Signed)
Last OV yesterday 07-26-11 last refill 06-21-11 #30 no refills

## 2011-07-27 NOTE — Telephone Encounter (Signed)
Ok for #60, 1 refill 

## 2011-07-27 NOTE — Telephone Encounter (Signed)
Pt states she was seen yesterday and forgot to ask for a refill on her clonazepam 0.5mg . When advised to call her pharmacy, pt states "I only have 1 pill left and every time I call the pharmacy it takes days." I advised pt I would put in her request. [facility] [address]

## 2011-09-26 ENCOUNTER — Telehealth: Payer: Self-pay | Admitting: Family Medicine

## 2011-09-26 ENCOUNTER — Telehealth: Payer: Self-pay | Admitting: Family

## 2011-09-26 ENCOUNTER — Ambulatory Visit (INDEPENDENT_AMBULATORY_CARE_PROVIDER_SITE_OTHER): Payer: Medicare Other | Admitting: Family

## 2011-09-26 ENCOUNTER — Encounter: Payer: Self-pay | Admitting: Family

## 2011-09-26 VITALS — BP 124/76 | HR 82 | Temp 98.2°F | Resp 16 | Wt 201.0 lb

## 2011-09-26 DIAGNOSIS — B0229 Other postherpetic nervous system involvement: Secondary | ICD-10-CM

## 2011-09-26 DIAGNOSIS — J329 Chronic sinusitis, unspecified: Secondary | ICD-10-CM

## 2011-09-26 MED ORDER — AMOXICILLIN-POT CLAVULANATE 875-125 MG PO TABS: 1.0000 | ORAL_TABLET | Freq: Two times a day (BID) | ORAL | Status: DC

## 2011-09-26 NOTE — Progress Notes (Signed)
Subjective:    Patient ID: Samantha Briggs, female    DOB: Sep 26, 1932, 78 y.o.   MRN: [id number]  HPI  Samantha Briggs is a 78 yr old female who presents today with chief complaint of sinus congestion.  Prior to this she had coughing/sneezing.  She reports that symptoms started about 2 days ago and are associated with tooth aching, lightheadedness and cough. She denies associated fever.  Nasal drainage is clear. She has not tried any otc meds due to concerns with her blood pressure.  Post herpetic neuralgia- reports that she had shingles in April and continues to have pain along the beltline on the right.  She is bothered by the pain.   Review of Systems See HPI  Past Medical History  Diagnosis Date  . Anemia   . Depression   . Glaucoma   . Allergy   . Hypertension     History   Social History  . Marital Status: Married    Spouse Name: N/A    Number of Children: N/A  . Years of Education: N/A   Occupational History  . Not on file.   Social History Main Topics  . Smoking status: Former Games developer  . Smokeless tobacco: Never Used  . Alcohol Use: No  . Drug Use: Not on file  . Sexually Active: Not on file   Other Topics Concern  . Not on file   Social History Narrative  . No narrative on file    Past Surgical History  Procedure Date  . Cholecystectomy   . Dilation and curettage of uterus   . Retinal detachment surgery   . Corneal transplant     x2    No family history on file.  Allergies  Allergen Reactions  . Morphine And Related Hives  . Neurontin (Gabapentin)     hives    Current Outpatient Prescriptions on File Prior to Visit  Medication Sig Dispense Refill  . amLODipine (NORVASC) 2.5 MG tablet Take 1 tablet (2.5 mg total) by mouth daily.  30 tablet  2  . aspirin 81 MG tablet Take 81 mg by mouth daily.        Marland Kitchen latanoprost (XALATAN) 0.005 % ophthalmic solution Place 1 drop into the left eye daily.       Marland Kitchen omeprazole (PRILOSEC) 20 MG capsule Take 20  mg by mouth daily.        Marland Kitchen PRED MILD 0.12 % ophthalmic suspension Place 1 drop into both eyes.       Marland Kitchen sertraline (ZOLOFT) 50 MG tablet Take 1 tablet (50 mg total) by mouth daily.  30 tablet  6  . valsartan-hydrochlorothiazide (DIOVAN HCT) 320-25 MG per tablet Take 1 tablet by mouth daily.  30 tablet  3    BP 124/76  Pulse 82  Temp 98.2 F (36.8 C) (Oral)  Resp 16  Wt 201 lb (91.173 kg)  SpO2 97%       Objective:   Physical Exam  Constitutional: She appears well-developed and well-nourished.  HENT:  Head: Normocephalic and atraumatic.  Right Ear: Tympanic membrane and ear canal normal.  Left Ear: Tympanic membrane and ear canal normal.  Mouth/Throat: No oropharyngeal exudate, posterior oropharyngeal edema or posterior oropharyngeal erythema.       + maxillary and frontal sinus tenderness to palpation bilaterally.  Cardiovascular: Normal rate and regular rhythm.   No murmur heard. Pulmonary/Chest: Effort normal and breath sounds normal.  Psychiatric: She has a normal mood and affect. Her behavior is  normal. Judgment and thought content normal.          Assessment & Plan:

## 2011-09-26 NOTE — Telephone Encounter (Addendum)
Pls let pt know that I reviewed her chart and see that she had itching on gabapentin.  I do not want her to restart this medication.  I will discuss alternative meds with Dr. Beverely Low.

## 2011-09-26 NOTE — Telephone Encounter (Signed)
AMLODIPINE BESYLATE 2.5 mg tablet Qty: 30 Last refill: 08/26/11 Take one tablet by mouth everyday

## 2011-09-26 NOTE — Patient Instructions (Addendum)
You can try using nasal saline spray to help with your congestion. You may use claritin 10mg  once daily as needed for allergies. You can restart gabapentin 300mg  once daily at bedtime for 3 days, then twice daily for 3 days, then 3 times daily as tolerated.   Sinusitis Sinuses are air pockets within the bones of your face. The growth of bacteria within a sinus leads to infection. The infection prevents the sinuses from draining. This infection is called sinusitis. SYMPTOMS  There will be different areas of pain depending on which sinuses have become infected.  The maxillary sinuses often produce pain beneath the eyes.   Frontal sinusitis may cause pain in the middle of the forehead and above the eyes.  Other problems (symptoms) include:  Toothaches.   Colored, pus-like (purulent) drainage from the nose.   Swelling, warmth, and tenderness over the sinus areas may be signs of infection.  TREATMENT  Sinusitis is most often determined by an exam.X-rays may be taken. If x-rays have been taken, make sure you obtain your results or find out how you are to obtain them. Your caregiver may give you medications (antibiotics). These are medications that will help kill the bacteria causing the infection. You may also be given a medication (decongestant) that helps to reduce sinus swelling.  HOME CARE INSTRUCTIONS   Only take over-the-counter or prescription medicines for pain, discomfort, or fever as directed by your caregiver.   Drink extra fluids. Fluids help thin the mucus so your sinuses can drain more easily.   Applying either moist heat or ice packs to the sinus areas may help relieve discomfort.   Use saline nasal sprays to help moisten your sinuses. The sprays can be found at your local drugstore.  SEEK IMMEDIATE MEDICAL CARE IF:  You have a fever.   You have increasing pain, severe headaches, or toothache.   You have nausea, vomiting, or drowsiness.   You develop unusual swelling  around the face or trouble seeing.  MAKE SURE YOU:   Understand these instructions.   Will watch your condition.   Will get help right away if you are not doing well or get worse.  Document Released: 12/20/2004 Document Revised: 12/09/2010 Document Reviewed: 07/19/2006 [facility] Patient Information 2012 [place], [place].

## 2011-09-26 NOTE — Telephone Encounter (Signed)
Caller: Lesbia/Patient; Patient Name: Samantha Briggs; PCP: Sheliah Hatch.; Best Callback Phone Number: [phone]; Calling regarding pressure to cheek bones and teeth, dizziness that started 09/25/11, afebrile. Has been sneezing as well. Requesting appointment if possible. Has tried ASA for the sinus pressure and no relief. Emergent signs and symptoms ruled out as per Upper Respiratory Infection protocol except for see in 24 hours due to mild to moderate headache for more than 24 hours unrelieved with nonprescription medications. Appointment scheduled with Sandford Craze NP at 1:15 pm 09/26/11 at [facility] due to no available appointments.

## 2011-09-26 NOTE — Assessment & Plan Note (Signed)
Will rx with Augmentin. Recommended that she add claritin and nasal saline wash.

## 2011-09-26 NOTE — Telephone Encounter (Signed)
Noted  

## 2011-09-27 ENCOUNTER — Telehealth: Payer: Self-pay | Admitting: Family Medicine

## 2011-09-27 MED ORDER — PREGABALIN 50 MG PO CAPS: 50.0000 mg | ORAL_CAPSULE | Freq: Three times a day (TID) | ORAL | Status: DC | PRN

## 2011-09-27 MED ORDER — AMLODIPINE BESYLATE 2.5 MG PO TABS: 2.5000 mg | ORAL_TABLET | Freq: Every day | ORAL | Status: DC

## 2011-09-27 NOTE — Telephone Encounter (Signed)
rx sent to pharmacy by e-script  

## 2011-09-27 NOTE — Telephone Encounter (Signed)
Spoke to patient. Advised her not to restart gabapentin due to hx of itching on this medication.  Instead, will give trial of lyrica.  Pt aware that this may cause drowsiness and not to take prior to driving.

## 2011-09-27 NOTE — Assessment & Plan Note (Signed)
Will plan trial of lyrica.  See phone noted dated 9/23.

## 2011-09-27 NOTE — Telephone Encounter (Signed)
Refill: Amlodipine besylate 2.5mg  tab. Take 1 tablet by mouth every day. Qty 30. Last fill 08-26-11

## 2011-11-04 ENCOUNTER — Other Ambulatory Visit: Payer: Self-pay

## 2011-11-04 ENCOUNTER — Ambulatory Visit (INDEPENDENT_AMBULATORY_CARE_PROVIDER_SITE_OTHER): Payer: Medicare Other

## 2011-11-04 DIAGNOSIS — Z299 Encounter for prophylactic measures, unspecified: Secondary | ICD-10-CM

## 2011-11-04 DIAGNOSIS — Z23 Encounter for immunization: Secondary | ICD-10-CM

## 2011-11-04 MED ORDER — PNEUMOCOCCAL VAC POLYVALENT 25 MCG/0.5ML IJ INJ: 0.5000 mL | INJECTION | INTRAMUSCULAR | Status: DC

## 2011-11-04 NOTE — Addendum Note (Signed)
Addended by: Arnette Norris on: 11/04/2011 03:06 PM   Modules accepted: Orders

## 2011-11-04 NOTE — Telephone Encounter (Signed)
error 

## 2011-11-11 ENCOUNTER — Other Ambulatory Visit: Payer: Self-pay | Admitting: *Deleted

## 2011-11-11 MED ORDER — SERTRALINE HCL 50 MG PO TABS: 50.0000 mg | ORAL_TABLET | Freq: Every day | ORAL | Status: DC

## 2011-11-11 MED ORDER — VALSARTAN-HYDROCHLOROTHIAZIDE 320-25 MG PO TABS: 1.0000 | ORAL_TABLET | Freq: Every day | ORAL | Status: DC

## 2011-11-11 NOTE — Telephone Encounter (Signed)
Rx sent 

## 2011-12-07 ENCOUNTER — Telehealth: Payer: Self-pay | Admitting: Family Medicine

## 2011-12-07 MED ORDER — CLONAZEPAM 0.5 MG PO TABS: 0.5000 mg | ORAL_TABLET | Freq: Two times a day (BID) | ORAL | Status: DC | PRN

## 2011-12-07 NOTE — Telephone Encounter (Signed)
Rx sent 

## 2011-12-07 NOTE — Telephone Encounter (Signed)
Pharmacy requesting rx for clonazepam 0.5 mg. Take 1 tablet twice daily as needed for anxiety. Qty 60. Last fill 09-16-11

## 2011-12-07 NOTE — Telephone Encounter (Signed)
OV 07/26/11 Last filled 09/16/11 # 60  x0 PLz advise    MW

## 2011-12-07 NOTE — Telephone Encounter (Signed)
Ok for #60, 1 refill 

## 2012-02-07 ENCOUNTER — Other Ambulatory Visit: Payer: Self-pay | Admitting: Family Medicine

## 2012-02-07 MED ORDER — VALSARTAN-HYDROCHLOROTHIAZIDE 320-25 MG PO TABS: 1.0000 | ORAL_TABLET | Freq: Every day | ORAL | Status: DC

## 2012-02-07 NOTE — Telephone Encounter (Signed)
refill  Valsartan-HCTZ (Tab)  320-25 MG Take 1 tablet by mouth daily. #30 WT/2-REFILLS LAST FILL 1.6.14

## 2012-02-07 NOTE — Telephone Encounter (Signed)
RX sent

## 2012-02-24 ENCOUNTER — Other Ambulatory Visit: Payer: Self-pay | Admitting: Family Medicine

## 2012-02-28 ENCOUNTER — Other Ambulatory Visit: Payer: Self-pay | Admitting: Family Medicine

## 2012-02-28 DIAGNOSIS — F329 Major depressive disorder, single episode, unspecified: Secondary | ICD-10-CM

## 2012-02-28 NOTE — Telephone Encounter (Signed)
[facility] [address]

## 2012-02-29 ENCOUNTER — Other Ambulatory Visit: Payer: Self-pay | Admitting: Family Medicine

## 2012-02-29 NOTE — Telephone Encounter (Signed)
Please advise on RF request.  Last OV:06-21-11.//AB/CMA

## 2012-02-29 NOTE — Telephone Encounter (Signed)
Ok for #60, 1 refill.  needs to sign agreement and do UDS if not already done  

## 2012-02-29 NOTE — Telephone Encounter (Signed)
Rx printed and will need to be picked up by pt, and Controlled Substance Contract needs to be signed.  LM @ (4:49pm) asking the pt to RTC regarding the contract..//AB/CMA

## 2012-03-16 ENCOUNTER — Other Ambulatory Visit: Payer: Self-pay | Admitting: Family Medicine

## 2012-03-16 DIAGNOSIS — I10 Essential (primary) hypertension: Secondary | ICD-10-CM

## 2012-03-16 NOTE — Telephone Encounter (Signed)
Ref[facility]ine [facility] [address].

## 2012-04-10 ENCOUNTER — Encounter: Payer: Self-pay | Admitting: Family Medicine

## 2012-04-16 ENCOUNTER — Telehealth: Payer: Self-pay | Admitting: Family Medicine

## 2012-04-16 ENCOUNTER — Ambulatory Visit: Payer: Medicare Other | Admitting: Family Medicine

## 2012-04-16 DIAGNOSIS — I10 Essential (primary) hypertension: Secondary | ICD-10-CM

## 2012-04-16 NOTE — Telephone Encounter (Signed)
Amlodipine besylate 2.5 mg tablet Qty:30 Take 1 tablet by mouth everyday

## 2012-04-17 MED ORDER — AMLODIPINE BESYLATE 2.5 MG PO TABS: 2.5000 mg | ORAL_TABLET | Freq: Every day | ORAL | Status: DC

## 2012-04-17 NOTE — Telephone Encounter (Signed)
Rx sent to the pharmacy by e-script.//AB/CMA 

## 2012-04-30 ENCOUNTER — Ambulatory Visit (INDEPENDENT_AMBULATORY_CARE_PROVIDER_SITE_OTHER): Payer: Medicare Other | Admitting: Family Medicine

## 2012-04-30 ENCOUNTER — Encounter: Payer: Self-pay | Admitting: *Deleted

## 2012-04-30 ENCOUNTER — Encounter: Payer: Self-pay | Admitting: Family Medicine

## 2012-04-30 VITALS — BP 136/60 | HR 71 | Temp 97.9°F | Ht 65.5 in | Wt 209.0 lb

## 2012-04-30 DIAGNOSIS — I10 Essential (primary) hypertension: Secondary | ICD-10-CM

## 2012-04-30 DIAGNOSIS — Z6379 Other stressful life events affecting family and household: Secondary | ICD-10-CM

## 2012-04-30 DIAGNOSIS — Z636 Dependent relative needing care at home: Secondary | ICD-10-CM

## 2012-04-30 LAB — BASIC METABOLIC PANEL
CO2: 27 mEq/L (ref 19–32)
Calcium: 8.8 mg/dL (ref 8.4–10.5)
Creatinine, Ser: 0.8 mg/dL (ref 0.4–1.2)
GFR: 76.05 mL/min (ref >60.00)
Sodium: 139 mEq/L (ref 135–145)

## 2012-04-30 NOTE — Patient Instructions (Addendum)
Schedule your complete physical in 3 months We'll notify you of your lab results No changes to your BP meds Keep up the good work! Hang in there!!!

## 2012-04-30 NOTE — Assessment & Plan Note (Signed)
Chronic problem.  Adequate control.  Asymptomatic.  Check BMP.  No anticipated med changes. 

## 2012-04-30 NOTE — Progress Notes (Signed)
  Subjective:    Patient ID: Samantha Briggs, female    DOB: 1932/09/12, 79 y.o.   MRN: [id number]  HPI HTN- chronic problem, adequately controlled today on Amlodipine and Diovan-HCTZ.  Denies HA, visual changes, CP, SOB, edema.  Caregiver stress- husband w/ dementia.  This is very hard on pt- 'hardest thing i've ever done'.  In counseling regularly.  On Zoloft regularly and klonopin prn.   Review of Systems For ROS see HPI     Objective:   Physical Exam  Vitals reviewed. Constitutional: She is oriented to person, place, and time. She appears well-developed and well-nourished. No distress.  HENT:  Head: Normocephalic and atraumatic.  Eyes: Conjunctivae and EOM are normal. Pupils are equal, round, and reactive to light.  Neck: Normal range of motion. Neck supple. No thyromegaly present.  Cardiovascular: Normal rate, regular rhythm, normal heart sounds and intact distal pulses.   No murmur heard. Pulmonary/Chest: Effort normal and breath sounds normal. No respiratory distress.  Abdominal: Soft. She exhibits no distension. There is no tenderness.  Musculoskeletal: She exhibits no edema.  Lymphadenopathy:    She has no cervical adenopathy.  Neurological: She is alert and oriented to person, place, and time.  Skin: Skin is warm and dry.  Psychiatric: She has a normal mood and affect. Her behavior is normal.          Assessment & Plan:

## 2012-04-30 NOTE — Assessment & Plan Note (Signed)
Unchanged.  Pt on daily SSRI and benzo prn.  In counseling regularly.  Will continue to follow and assist as able.

## 2012-05-12 ENCOUNTER — Other Ambulatory Visit: Payer: Self-pay | Admitting: Family Medicine

## 2012-05-14 NOTE — Telephone Encounter (Signed)
Rx sent to the pharmacy by e-script.//AB/CMA 

## 2012-05-22 ENCOUNTER — Ambulatory Visit (INDEPENDENT_AMBULATORY_CARE_PROVIDER_SITE_OTHER): Payer: Medicare Other | Admitting: Internal Medicine

## 2012-05-22 ENCOUNTER — Other Ambulatory Visit: Payer: Self-pay | Admitting: Internal Medicine

## 2012-05-22 ENCOUNTER — Encounter: Payer: Self-pay | Admitting: Internal Medicine

## 2012-05-22 VITALS — BP 134/76 | HR 92 | Temp 98.2°F | Wt 205.0 lb

## 2012-05-22 DIAGNOSIS — M79675 Pain in left toe(s): Secondary | ICD-10-CM

## 2012-05-22 DIAGNOSIS — M79609 Pain in unspecified limb: Secondary | ICD-10-CM

## 2012-05-22 LAB — URIC ACID: Uric Acid, Serum: 7.8 mg/dL — ABNORMAL HIGH (ref 2.4–7.0)

## 2012-05-22 MED ORDER — VALSARTAN 320 MG PO TABS: 320.0000 mg | ORAL_TABLET | Freq: Every day | ORAL | Status: DC

## 2012-05-22 MED ORDER — INDOMETHACIN 50 MG PO CAPS: 50.0000 mg | ORAL_CAPSULE | Freq: Three times a day (TID) | ORAL | Status: DC

## 2012-05-22 NOTE — Patient Instructions (Signed)
Take indomethacin every 8 hours as needed with food If the uric acid is elevated high fructose corn syrup should be restricted and Valsalva/HCT should be changed to plain Valsalvan

## 2012-05-22 NOTE — Progress Notes (Signed)
  Subjective:    Patient ID: Samantha Briggs, female    DOB: 10/29/1932, 79 y.o.   MRN: [id number]  HPI Last night she was at Bible study; as she began to leave she noted pain in the base of the left toe and the toe itself. There was no trauma or trigger for this. The pain progressed through the night.  The pain was described as sharp and worse with weight bearing or flexion of the toe.  It was associated redness and swelling.  There is no past medical history gout. She has no history of peptic ulcer disease.  Initially she denied taking HCTZ; but she is on valsartan/HCT.      Review of Systems  She has no associated fever, chills, sweats, or weight loss       Objective:   Physical Exam  She appears healthy and well-nourished in no distress  There is decreased range of motion of the neck.  There is slight accentuated curvature of the upper thoracic spine at the base of the neck.  Crepitus of the knees is present.  There are no significant arthritic changes in hands  She is able to lie flat and sit up without help.  There is redness and swelling at the base of the left toe. There is pain to light palpation of this area        Assessment & Plan:  #1 classic podagra is suggested. This may be due to hyperuricemia.  Plan: Indomethacin 50 mg every 8 hours with food is recommended as needed. Uric acid will be checked.  The valsartan/HCT will most likely be changed to plain valsartan.Pending results; amlodipine 2.5 mg will be increased to 2 pills daily.

## 2012-05-24 ENCOUNTER — Telehealth: Payer: Self-pay

## 2012-05-24 NOTE — Telephone Encounter (Signed)
Called patient, unable to leave a message, I will try to reach patient again later

## 2012-05-24 NOTE — Telephone Encounter (Signed)
Message copied by Maurice Small on Thu May 24, 2012 12:10 PM ------      Message from: Pecola Lawless      Created: Tue May 22, 2012  7:14 PM       To prevent gout the minimal uric acid goal is < 7; preferred is < 6, ideal is < 5 .  The most common cause of elevated uric acid is the ingestion of sugar from high fructose corn syrup sources. You should consume less than 30 grams  of sugar per day from foods and drinks with high fructose corn syrup as number 1, 2, 3, or #4 on the label.      The HCTZ in valsartan/HCT may also raise uric acid. This will be changed to valsartan 320 mg daily . If the blood pressure remains uncontrolled; the amlodipine should be increased from 2.5 mg daily to a dose of 5 mg daily.      Please followup with Dr. Beverely Low to repeat the uric acid and followup blood pressure in 4-6 weeks. (Code: 274.9)             ------

## 2012-05-29 ENCOUNTER — Telehealth: Payer: Self-pay | Admitting: General Practice

## 2012-05-29 NOTE — Telephone Encounter (Signed)
Pt called wanting to know the results of her Blood work from Last week. Discussed the results and changes in medication with pt. Advised pt that if edema remained in feet and her Bp is not within controlled levels to call office to schedule an appt with Tabori.

## 2012-06-06 ENCOUNTER — Telehealth: Payer: Self-pay | Admitting: Family Medicine

## 2012-06-06 DIAGNOSIS — I10 Essential (primary) hypertension: Secondary | ICD-10-CM

## 2012-06-06 MED ORDER — AMLODIPINE BESYLATE 2.5 MG PO TABS: 2.5000 mg | ORAL_TABLET | Freq: Every day | ORAL | Status: DC

## 2012-06-06 NOTE — Telephone Encounter (Signed)
Received a refill fax also. Meds filled with [facility].

## 2012-06-06 NOTE — Telephone Encounter (Signed)
Called pt back and LMOVM regarding her gout meds.

## 2012-06-06 NOTE — Telephone Encounter (Signed)
Unable to reach patient, letter was mailed to patient with instructions below

## 2012-06-06 NOTE — Telephone Encounter (Signed)
Pt would like a nurse to call her back because she is having problems with her gout medication. thanks

## 2012-06-11 MED ORDER — SERTRALINE HCL 50 MG PO TABS: ORAL_TABLET | ORAL | Status: DC

## 2012-06-11 MED ORDER — VALSARTAN-HYDROCHLOROTHIAZIDE 320-25 MG PO TABS: ORAL_TABLET | ORAL | Status: DC

## 2012-06-11 MED ORDER — CLONAZEPAM 0.5 MG PO TABS: ORAL_TABLET | ORAL | Status: DC

## 2012-06-11 NOTE — Telephone Encounter (Signed)
Fluid pills can frequently cause gout attacks.  It is up to her if she wants to run the risk of restarting medication.  The other option would be to increase her amlodipine from 1 tab (2.5mg ) to 2 tabs (5 mg) daily.  If she wants to restart HCTZ, she can have 12.5mg  daily, #30, 3 refills

## 2012-06-11 NOTE — Telephone Encounter (Signed)
Pt meds filled. Pt also notified about the HCTZ. States she will take 1 tablet of amlodipine and 1/2 tablet of HCTZ.

## 2012-06-11 NOTE — Telephone Encounter (Signed)
Patient wants to know what she should do in regards to her blood pressure medication. States that it is causing her ankles to swell up.

## 2012-06-11 NOTE — Telephone Encounter (Signed)
Patient needs new rx for clonazepam sent to [facility].

## 2012-06-11 NOTE — Telephone Encounter (Signed)
Pt states that since she had seen Dr. Alwyn Ren and BP medication was changed that her BP is increasing and her ankles are becoming swollen. Was taken off Bp meds with HCTZ due to gout. Has completed the gout therapy can she resume old medication?

## 2012-08-02 ENCOUNTER — Other Ambulatory Visit: Payer: Self-pay | Admitting: Family Medicine

## 2012-08-03 NOTE — Telephone Encounter (Signed)
Last fill 05/31/2012 Last OV acute with Dr. Alwyn Ren 05/22/2012 Contract current No UDS

## 2012-08-17 ENCOUNTER — Encounter: Payer: Self-pay | Admitting: Family

## 2012-08-17 ENCOUNTER — Ambulatory Visit (INDEPENDENT_AMBULATORY_CARE_PROVIDER_SITE_OTHER): Payer: Medicare Other | Admitting: Family

## 2012-08-17 ENCOUNTER — Telehealth: Payer: Self-pay | Admitting: *Deleted

## 2012-08-17 VITALS — BP 150/70 | HR 78 | Temp 98.8°F | Resp 18 | Wt 213.0 lb

## 2012-08-17 DIAGNOSIS — M109 Gout, unspecified: Secondary | ICD-10-CM

## 2012-08-17 DIAGNOSIS — M722 Plantar fascial fibromatosis: Secondary | ICD-10-CM

## 2012-08-17 DIAGNOSIS — I10 Essential (primary) hypertension: Secondary | ICD-10-CM

## 2012-08-17 MED ORDER — METOPROLOL SUCCINATE ER 25 MG PO TB24: 25.0000 mg | ORAL_TABLET | Freq: Every day | ORAL | Status: DC

## 2012-08-17 NOTE — Assessment & Plan Note (Signed)
BP Readings from Last 3 Encounters:  08/17/12 150/70  05/22/12 134/76  04/30/12 136/60   BP uncontrolled on current regimen with LE swelling.  ? If amlodipine is contributing to the edema.  Plan to initiated toprol xl in place of amlodipine.   She is instructed to follow up with Dr. Beverely Low in 2 weeks for bp recheck. If edema persists after discontinuation of amlodipine, can consider resuming HCTZ at that time.

## 2012-08-17 NOTE — Progress Notes (Signed)
Subjective:    Patient ID: Samantha Briggs, female    DOB: November 13, 1932, 79 y.o.   MRN: [id number]  HPI  Samantha Briggs is a 79 yr old female who presents today with chief complaint of bilateral foot swelling. She was last seen on 5/20 with an acute gout flare. She tells me this is the first gout attack she has suffered. Uric acid was noted to be elevated.  Her diovan/HCTZ was changed to plain diovan as it was felt that this may be contributing to her gout symptoms.  She reports that after discontinuation of the hctz, she started to have LE swelling.  It was recommended to her by Dr. Beverely Low that she take 1/2 of the diovan/hctz.  She reports that after switching her swelling improved.    She reports that she has been compliant with a gout diet.  She denies chest pain or shortness of breath.   Heel pain- Today she reports pain in the right foot at the base of the heel.  This pain started yesterday. She reports that she has a remote hx of plantar fasciitis.  Pain is made worse by weight bearing.    Review of Systems See HPI  Past Medical History  Diagnosis Date  . Anemia   . Depression   . Glaucoma   . Allergy   . Hypertension     History   Social History  . Marital Status: Married    Spouse Name: N/A    Number of Children: N/A  . Years of Education: N/A   Occupational History  . Not on file.   Social History Main Topics  . Smoking status: Former Games developer  . Smokeless tobacco: Never Used  . Alcohol Use: No  . Drug Use: Not on file  . Sexual Activity: Not on file   Other Topics Concern  . Not on file   Social History Narrative  . No narrative on file    Past Surgical History  Procedure Laterality Date  . Cholecystectomy    . Dilation and curettage of uterus    . Retinal detachment surgery    . Corneal transplant      x2    No family history on file.  Allergies  Allergen Reactions  . Morphine And Related Hives  . Neurontin [Gabapentin]     hives    Current  Outpatient Prescriptions on File Prior to Visit  Medication Sig Dispense Refill  . amLODipine (NORVASC) 2.5 MG tablet Take 1 tablet (2.5 mg total) by mouth daily.  90 tablet  1  . aspirin 81 MG tablet Take 81 mg by mouth daily.        . clonazePAM (KLONOPIN) 0.5 MG tablet TAKE 1 TABLET BY MOUTH TWICE A DAY AS NEEDED FOR ANXIETY  60 tablet  1  . indomethacin (INDOCIN) 50 MG capsule Take 1 capsule (50 mg total) by mouth 3 (three) times daily with meals.  21 capsule  0  . latanoprost (XALATAN) 0.005 % ophthalmic solution Place 1 drop into the left eye daily.       Marland Kitchen omeprazole (PRILOSEC) 20 MG capsule Take 20 mg by mouth daily.        Marland Kitchen PRED MILD 0.12 % ophthalmic suspension Place 1 drop into both eyes.       Marland Kitchen sertraline (ZOLOFT) 50 MG tablet TAKE 1 TABLET BY MOUTH EVERY DAY  30 tablet  3  . valsartan (DIOVAN) 320 MG tablet Take 1 tablet (320 mg total) by mouth daily.  Valsartan/HCT can raise uric acid & cause gout  30 tablet  1   No current facility-administered medications on file prior to visit.    BP 150/70  Pulse 78  Temp(Src) 98.8 F (37.1 C) (Oral)  Resp 18  Wt 213 lb 0.6 oz (96.634 kg)  BMI 34.9 kg/m2  SpO2 96%       Objective:   Physical Exam  Constitutional: She is oriented to person, place, and time. She appears well-developed and well-nourished. No distress.  Cardiovascular: Normal rate and regular rhythm.   No murmur heard. Pulmonary/Chest: Effort normal and breath sounds normal. No respiratory distress. She has no wheezes. She has no rales. She exhibits no tenderness.  Musculoskeletal:  1+ bilateral LE edema.    Mild tenderness to palpation of the right heel, no redness or swelling.   Neurological: She is alert and oriented to person, place, and time.  Psychiatric: She has a normal mood and affect. Thought content normal.          Assessment & Plan:

## 2012-08-17 NOTE — Telephone Encounter (Signed)
Received message from pt requesting clarification of anti-inflammatory. Pt thought we were going to send rx. Per verbal from Provider, no rx but may try Aleve for a few days (1wk or less). Pt voices understanding.

## 2012-08-17 NOTE — Assessment & Plan Note (Signed)
Recommended that she ice her heel twice daily.  We reviewed exercises for plantar fasciitis.

## 2012-08-17 NOTE — Patient Instructions (Addendum)
Stop amlodipine, start Toprol xl.   Continue diovan.   Complete your lab work prior to leaving. Call if symptoms worsen or if symptoms do not improve.  Start stretching exercises for plantar fasciitis- Stretching exercises may be helpful. Home exercises include  foot/ankle circles, toe curls, and toe towel curls. Be sure to perform these exercises with care to avoid causing more pain. Pain medication   Follow up with Dr. Beverely Low in 2 weeks for BP check.

## 2012-08-17 NOTE — Assessment & Plan Note (Signed)
Repeat uric acid level.  If elevated, consider adding allopurinol.

## 2012-08-21 ENCOUNTER — Telehealth: Payer: Self-pay | Admitting: Family

## 2012-08-21 MED ORDER — ALLOPURINOL 300 MG PO TABS: 300.0000 mg | ORAL_TABLET | Freq: Every day | ORAL | Status: DC

## 2012-08-21 NOTE — Telephone Encounter (Signed)
Please call pt and let her know that her blood work shows that her uric acid level is still elevated.  I think she would benefit from the addition of allopurinol once daily to keep the levels down and prevent future gout flares. rx sent to [facility].  I will add allopurinol. She should plan to follow up with Dr. Beverely Low on 9/17 as scheduled.

## 2012-08-24 NOTE — Telephone Encounter (Signed)
Notified patient and advised her per verbal from Provider to call for earlier appt if foot pain is not improved in 1 week and she voices understanding.

## 2012-09-16 ENCOUNTER — Other Ambulatory Visit: Payer: Self-pay | Admitting: Family

## 2012-09-19 ENCOUNTER — Encounter: Payer: Self-pay | Admitting: Family Medicine

## 2012-09-19 ENCOUNTER — Ambulatory Visit (INDEPENDENT_AMBULATORY_CARE_PROVIDER_SITE_OTHER): Payer: Medicare Other | Admitting: Family Medicine

## 2012-09-19 VITALS — BP 130/78 | HR 79 | Temp 97.7°F | Ht 65.5 in | Wt 210.6 lb

## 2012-09-19 DIAGNOSIS — Z0000 Encounter for general adult medical examination without abnormal findings: Secondary | ICD-10-CM

## 2012-09-19 DIAGNOSIS — M109 Gout, unspecified: Secondary | ICD-10-CM

## 2012-09-19 DIAGNOSIS — I10 Essential (primary) hypertension: Secondary | ICD-10-CM

## 2012-09-19 DIAGNOSIS — Z23 Encounter for immunization: Secondary | ICD-10-CM

## 2012-09-19 LAB — HEPATIC FUNCTION PANEL
ALT: 16 U/L (ref 0–35)
AST: 21 U/L (ref 0–37)
Albumin: 3.8 g/dL (ref 3.5–5.2)
Alkaline Phosphatase: 71 U/L (ref 39–117)
Bilirubin, Direct: 0 mg/dL (ref 0.0–0.3)
Total Bilirubin: 0.3 mg/dL (ref 0.3–1.2)
Total Protein: 6.9 g/dL (ref 6.0–8.3)

## 2012-09-19 LAB — BASIC METABOLIC PANEL WITH GFR
BUN: 13 mg/dL (ref 6–23)
CO2: 24 meq/L (ref 19–32)
Calcium: 8.5 mg/dL (ref 8.4–10.5)
Chloride: 106 meq/L (ref 96–112)
Creatinine, Ser: 0.6 mg/dL (ref 0.4–1.2)
GFR: 102.84 mL/min
Glucose, Bld: 90 mg/dL (ref 70–99)
Potassium: 3.9 meq/L (ref 3.5–5.1)
Sodium: 137 meq/L (ref 135–145)

## 2012-09-19 LAB — CBC WITH DIFFERENTIAL/PLATELET
Basophils Absolute: 0 K/uL (ref 0.0–0.1)
Basophils Relative: 0.4 % (ref 0.0–3.0)
Eosinophils Absolute: 0.1 K/uL (ref 0.0–0.7)
Eosinophils Relative: 1.7 % (ref 0.0–5.0)
HCT: 33 % — ABNORMAL LOW (ref 36.0–46.0)
Hemoglobin: 10.6 g/dL — ABNORMAL LOW (ref 12.0–15.0)
Lymphocytes Relative: 27 % (ref 12.0–46.0)
Lymphs Abs: 1.5 K/uL (ref 0.7–4.0)
MCHC: 32.1 g/dL (ref 30.0–36.0)
MCV: 71 fl — ABNORMAL LOW (ref 78.0–100.0)
Monocytes Absolute: 0.4 K/uL (ref 0.1–1.0)
Monocytes Relative: 7.6 % (ref 3.0–12.0)
Neutro Abs: 3.4 K/uL (ref 1.4–7.7)
Neutrophils Relative %: 63.3 % (ref 43.0–77.0)
Platelets: 215 K/uL (ref 150.0–400.0)
RBC: 4.66 Mil/uL (ref 3.87–5.11)
RDW: 18.1 % — ABNORMAL HIGH (ref 11.5–14.6)
WBC: 5.4 K/uL (ref 4.5–10.5)

## 2012-09-19 LAB — LIPID PANEL
Cholesterol: 196 mg/dL (ref 0–200)
Total CHOL/HDL Ratio: 6
Triglycerides: 248 mg/dL — ABNORMAL HIGH (ref 0.0–149.0)

## 2012-09-19 LAB — TSH: TSH: 3.25 u[IU]/mL (ref 0.35–5.50)

## 2012-09-19 LAB — LDL CHOLESTEROL, DIRECT: Direct LDL: 136.1 mg/dL

## 2012-09-19 MED ORDER — OMEPRAZOLE 20 MG PO CPDR: 20.0000 mg | DELAYED_RELEASE_CAPSULE | Freq: Every day | ORAL | Status: DC

## 2012-09-19 MED ORDER — METOPROLOL SUCCINATE ER 25 MG PO TB24: ORAL_TABLET | ORAL | Status: DC

## 2012-09-19 MED ORDER — SERTRALINE HCL 50 MG PO TABS: ORAL_TABLET | ORAL | Status: DC

## 2012-09-19 MED ORDER — ALLOPURINOL 100 MG PO TABS: 100.0000 mg | ORAL_TABLET | Freq: Every day | ORAL | Status: DC

## 2012-09-19 MED ORDER — VALSARTAN 320 MG PO TABS: 320.0000 mg | ORAL_TABLET | Freq: Every day | ORAL | Status: DC

## 2012-09-19 NOTE — Patient Instructions (Addendum)
We'll notify you of your lab results and make any changes if needed Start the Allopurinol daily- 1 daily Call with any questions or concerns Keep up the good work!  You look great!

## 2012-09-19 NOTE — Progress Notes (Signed)
  Subjective:    Patient ID: Samantha Briggs, female    DOB: 04-17-1932, 79 y.o.   MRN: [id number]  HPI Here today for CPE.  Risk Factors: HTN- chronic problem, on Toprol and Diovan.  Denies CP, SOB, HAs, visual changes, edema.  Physical Activity: active but no formal exercise Fall Risk: low risk Depression: chronic problem, sxs are well controlled on Zoloft and klonopin Hearing: pt reports decreased hearing to conversational tones ADL's: independent Cognitive: normal linear thought process, memory and attention intact Home Safety: safe at home, lives w/ demented husband Height, Weight, BMI, Visual Acuity: see vitals, vision corrected to 20/20 w/ glasses Counseling: UTD on colonoscopy, no need for pap.  Pt not interested in mammo, DEXA Labs Ordered: See A&P Care Plan: See A&P    Review of Systems Patient reports no vision changes, adenopathy,fever, weight change,  persistant/recurrent hoarseness , swallowing issues, chest pain, palpitations, edema, persistant/recurrent cough, hemoptysis, dyspnea (rest/exertional/paroxysmal nocturnal), gastrointestinal bleeding (melena, rectal bleeding), abdominal pain, significant heartburn, bowel changes, GU symptoms (dysuria, hematuria, incontinence), Gyn symptoms (abnormal  bleeding, pain),  syncope, focal weakness, memory loss, numbness & tingling, skin/hair/nail changes, abnormal bruising or bleeding, anxiety, or depression.     Objective:   Physical Exam General Appearance:    Alert, cooperative, no distress, appears stated age  Head:    Normocephalic, without obvious abnormality, atraumatic  Eyes:    PERRL, conjunctiva/corneas clear, EOM's intact, fundi    benign, both eyes  Ears:    Normal TM's and external ear canals, both ears  Nose:   Nares normal, septum midline, mucosa normal, no drainage    or sinus tenderness  Throat:   Lips, mucosa, and tongue normal; teeth and gums normal  Neck:   Supple, symmetrical, trachea midline, no  adenopathy;    Thyroid: no enlargement/tenderness/nodules  Back:     Symmetric, no curvature, ROM normal, no CVA tenderness  Lungs:     Clear to auscultation bilaterally, respirations unlabored  Chest Wall:    No tenderness or deformity   Heart:    Regular rate and rhythm, S1 and S2 normal, no murmur, rub   or gallop  Breast Exam:    Deferred at pt's request  Abdomen:     Soft, non-tender, bowel sounds active all four quadrants,    no masses, no organomegaly  Genitalia:    Deferred  Rectal:    Extremities:   Extremities normal, atraumatic, no cyanosis or edema  Pulses:   2+ and symmetric all extremities  Skin:   Skin color, texture, turgor normal, no rashes or lesions  Lymph nodes:   Cervical, supraclavicular, and axillary nodes normal  Neurologic:   CNII-XII intact, normal strength, sensation and reflexes    throughout          Assessment & Plan:

## 2012-09-20 ENCOUNTER — Other Ambulatory Visit: Payer: Self-pay | Admitting: Family Medicine

## 2012-09-21 NOTE — Telephone Encounter (Signed)
Already received by pharmacy. SW, CMA

## 2012-09-24 NOTE — Assessment & Plan Note (Signed)
Reviewed pt's uric acid levels and current meds.  Discussed that it is appropriate to start allopurinol to reduce levels but will start at a lower dose to avoid precipitating gout flare.  Pt expressed understanding and is in agreement w/ plan.

## 2012-09-24 NOTE — Assessment & Plan Note (Signed)
Chronic problem.  Adequate control.  Asymptomatic.  Check labs.  No anticipated med changes 

## 2012-09-24 NOTE — Assessment & Plan Note (Signed)
Pt's PE WNL.  UTD on colonoscopy.  Refusing mammo- 'at this point, i wouldn't be able to do anything about it anyway.  i'm not strong enough to know if there's something wrong'.  Check labs.  Anticipatory guidance provided.

## 2012-10-08 ENCOUNTER — Other Ambulatory Visit: Payer: Self-pay | Admitting: Internal Medicine

## 2012-10-08 NOTE — Telephone Encounter (Signed)
Diovan refill sent to pharmacy 

## 2012-11-12 ENCOUNTER — Encounter: Payer: Self-pay | Admitting: Family Medicine

## 2012-11-12 ENCOUNTER — Ambulatory Visit (INDEPENDENT_AMBULATORY_CARE_PROVIDER_SITE_OTHER): Payer: Medicare Other | Admitting: Family Medicine

## 2012-11-12 VITALS — BP 140/82 | HR 99 | Temp 98.5°F | Resp 16 | Wt 207.0 lb

## 2012-11-12 DIAGNOSIS — J0100 Acute maxillary sinusitis, unspecified: Secondary | ICD-10-CM

## 2012-11-12 DIAGNOSIS — Z6379 Other stressful life events affecting family and household: Secondary | ICD-10-CM

## 2012-11-12 DIAGNOSIS — Z636 Dependent relative needing care at home: Secondary | ICD-10-CM

## 2012-11-12 DIAGNOSIS — D649 Anemia, unspecified: Secondary | ICD-10-CM

## 2012-11-12 LAB — CBC WITH DIFFERENTIAL/PLATELET
Basophils Absolute: 0 10*3/uL (ref 0.0–0.1)
Eosinophils Relative: 1 % (ref 0.0–5.0)
HCT: 34.2 % — ABNORMAL LOW (ref 36.0–46.0)
Lymphs Abs: 1 10*3/uL (ref 0.7–4.0)
Monocytes Relative: 8.7 % (ref 3.0–12.0)
Neutrophils Relative %: 68 % (ref 43.0–77.0)
Platelets: 195 10*3/uL (ref 150.0–400.0)
RDW: 18.5 % — ABNORMAL HIGH (ref 11.5–14.6)
WBC: 4.5 10*3/uL (ref 4.5–10.5)

## 2012-11-12 MED ORDER — AMOXICILLIN 875 MG PO TABS: 875.0000 mg | ORAL_TABLET | Freq: Two times a day (BID) | ORAL | Status: DC

## 2012-11-12 MED ORDER — PROMETHAZINE-DM 6.25-15 MG/5ML PO SYRP: 5.0000 mL | ORAL_SOLUTION | Freq: Four times a day (QID) | ORAL | Status: DC | PRN

## 2012-11-12 NOTE — Patient Instructions (Signed)
Follow up in 1 month to recheck mood Start the Amoxicillin twice daily- take w/ food- for the sinus infection Drink plenty of fluids Mucinex DM for daytime cough Prescription syrup for night cough- will cause drowsiness REST! Call with any questions or concerns Hang in there!!!

## 2012-11-12 NOTE — Progress Notes (Signed)
  Subjective:    Patient ID: Samantha Briggs, female    DOB: [birth year], 79 y.o.   MRN: [id number]  HPI URI- 5 days ago developed cough.  Started wheezing.  Took Mucinex w/out relief.  No fevers.  'i cough until i think i'm going to throw up'.  + facial pain and HA.  No ear pain.    Caregiver stress- pt is now helping demented husband w/ ADLs.  Is being very difficult.  Considering NH placement due to stress.  Daughter doesn't understand mom's struggle- potential for conflict.  'i don't like myself, i don't like him- this is terrible'.  Anemia- noted on labs done at last visit.  Needs to complete iFOB and repeat CBC.  + fatigue.   Review of Systems For ROS see HPI     Objective:   Physical Exam  Vitals reviewed. Constitutional: She is oriented to person, place, and time. She appears well-developed and well-nourished. No distress.  HENT:  Head: Normocephalic and atraumatic.  Right Ear: Tympanic membrane normal.  Left Ear: Tympanic membrane normal.  Nose: Mucosal edema and rhinorrhea present. Right sinus exhibits maxillary sinus tenderness and frontal sinus tenderness. Left sinus exhibits maxillary sinus tenderness and frontal sinus tenderness.  Mouth/Throat: Uvula is midline and mucous membranes are normal. Posterior oropharyngeal erythema present. No oropharyngeal exudate.  Eyes: Conjunctivae and EOM are normal. Pupils are equal, round, and reactive to light.  Neck: Normal range of motion. Neck supple.  Cardiovascular: Normal rate, regular rhythm and normal heart sounds.   Pulmonary/Chest: Effort normal and breath sounds normal. No respiratory distress. She has no wheezes.  Lymphadenopathy:    She has no cervical adenopathy.  Neurological: She is alert and oriented to person, place, and time.  Psychiatric:  Tearful, anxious, upset          Assessment & Plan:

## 2012-11-13 NOTE — Assessment & Plan Note (Signed)
New.  Repeat CBC.  Pt to complete iFOB.

## 2012-11-13 NOTE — Assessment & Plan Note (Signed)
New.  Start abx.  Reviewed supportive care and red flags that should prompt return.  Pt expressed understanding and is in agreement w/ plan.  

## 2012-11-13 NOTE — Assessment & Plan Note (Signed)
Deteriorated.  Will refer husband to Hospice to assist in pt's stress.  NH placement for husband is likely Advertising account executive.  Will follow.

## 2012-12-07 ENCOUNTER — Other Ambulatory Visit: Payer: Self-pay | Admitting: General Practice

## 2012-12-07 ENCOUNTER — Ambulatory Visit (INDEPENDENT_AMBULATORY_CARE_PROVIDER_SITE_OTHER): Payer: Medicare Other | Admitting: Family Medicine

## 2012-12-07 ENCOUNTER — Encounter: Payer: Self-pay | Admitting: Family Medicine

## 2012-12-07 VITALS — BP 160/86 | HR 63 | Temp 97.8°F | Resp 16 | Wt 212.1 lb

## 2012-12-07 DIAGNOSIS — I10 Essential (primary) hypertension: Secondary | ICD-10-CM

## 2012-12-07 MED ORDER — HYDROCHLOROTHIAZIDE 12.5 MG PO TABS: 12.5000 mg | ORAL_TABLET | Freq: Every day | ORAL | Status: DC

## 2012-12-07 NOTE — Progress Notes (Signed)
   Subjective:    Patient ID: Samantha Briggs, female    DOB: 06-30-1932, 79 y.o.   MRN: [id number]  HPI Pre visit review using our clinic review tool, if applicable. No additional management support is needed unless otherwise documented below in the visit note.  HTN- chronic problem, things are very stressful w/ husband's illness.  When pt woke this AM BP was 202/82.  'yesterday was a terrible day'.  'i felt funny in my head'.  Denies CP, SOB, visual changes.  On Metoprolol and Diovan.  + edema.  Had pizza last night.  Reports that BP has not been as well controlled since stopping Amlodipine.   Review of Systems For ROS see HPI     Objective:   Physical Exam  Vitals reviewed. Constitutional: She is oriented to person, place, and time. She appears well-developed and well-nourished. No distress.  HENT:  Head: Normocephalic and atraumatic.  Eyes: Conjunctivae and EOM are normal. Pupils are equal, round, and reactive to light.  Neck: Normal range of motion. Neck supple. No thyromegaly present.  Cardiovascular: Normal rate, regular rhythm, normal heart sounds and intact distal pulses.   No murmur heard. Pulmonary/Chest: Effort normal and breath sounds normal. No respiratory distress.  Abdominal: Soft. She exhibits no distension. There is no tenderness.  Musculoskeletal: She exhibits edema (1+ pitting edema bilaterally).  Lymphadenopathy:    She has no cervical adenopathy.  Neurological: She is alert and oriented to person, place, and time.  Skin: Skin is warm and dry.  Psychiatric: She has a normal mood and affect. Her behavior is normal.          Assessment & Plan:

## 2012-12-07 NOTE — Patient Instructions (Signed)
Follow up as scheduled Start the Hydrochlorothiazide daily Continue the Valsartan and then metoprolol daily Call with any questions or concerns Hang in there!!!

## 2012-12-09 NOTE — Assessment & Plan Note (Signed)
Deteriorated.  Reviewed importance of low salt diet.  Suspect a lot of this is stress related.  Add HCTZ due to edema.  Will follow closely.  Reviewed supportive care and red flags that should prompt return.  Pt expressed understanding and is in agreement w/ plan.

## 2012-12-12 ENCOUNTER — Encounter: Payer: Self-pay | Admitting: Family Medicine

## 2012-12-12 ENCOUNTER — Ambulatory Visit (INDEPENDENT_AMBULATORY_CARE_PROVIDER_SITE_OTHER): Payer: Medicare Other | Admitting: Family Medicine

## 2012-12-12 VITALS — BP 148/60 | HR 96 | Temp 97.6°F | Ht 65.5 in | Wt 205.4 lb

## 2012-12-12 DIAGNOSIS — I10 Essential (primary) hypertension: Secondary | ICD-10-CM

## 2012-12-12 DIAGNOSIS — Z23 Encounter for immunization: Secondary | ICD-10-CM

## 2012-12-12 LAB — BASIC METABOLIC PANEL
CO2: 22 mEq/L (ref 19–32)
Calcium: 9 mg/dL (ref 8.4–10.5)
Chloride: 106 mEq/L (ref 96–112)
Glucose, Bld: 143 mg/dL — ABNORMAL HIGH (ref 70–99)
Potassium: 3.7 mEq/L (ref 3.5–5.1)
Sodium: 141 mEq/L (ref 135–145)

## 2012-12-12 NOTE — Progress Notes (Signed)
   Subjective:    Patient ID: Samantha Briggs, female    DOB: May 16, 1932, 79 y.o.   MRN: [id number]  HPI HTN- chronic problem.  Better controlled since adding HCTZ daily.  Now on Metoprolol, HCTZ, and diovan.  No CP, SOB, HAs, visual changes, edema.   Review of Systems For ROS see HPI     Objective:   Physical Exam  Vitals reviewed. Constitutional: She is oriented to person, place, and time. She appears well-developed and well-nourished. No distress.  HENT:  Head: Normocephalic and atraumatic.  Eyes: Conjunctivae and EOM are normal. Pupils are equal, round, and reactive to light.  Neck: Normal range of motion. Neck supple. No thyromegaly present.  Cardiovascular: Normal rate, regular rhythm, normal heart sounds and intact distal pulses.   No murmur heard. Pulmonary/Chest: Effort normal and breath sounds normal. No respiratory distress.  Abdominal: Soft. She exhibits no distension. There is no tenderness.  Musculoskeletal: She exhibits no edema.  Lymphadenopathy:    She has no cervical adenopathy.  Neurological: She is alert and oriented to person, place, and time.  Skin: Skin is warm and dry.  Psychiatric: She has a normal mood and affect. Her behavior is normal.          Assessment & Plan:

## 2012-12-12 NOTE — Progress Notes (Signed)
Pre visit review using our clinic review tool, if applicable. No additional management support is needed unless otherwise documented below in the visit note. 

## 2012-12-12 NOTE — Assessment & Plan Note (Signed)
Chronic problem.  Improved since adding HCTZ.  Check BMP to determine if pt's K+ and Cr are WNL.  Reviewed supportive care and red flags that should prompt return.  Pt expressed understanding and is in agreement w/ plan.

## 2012-12-12 NOTE — Patient Instructions (Signed)
Follow up in 3 months to recheck BP We'll notify you of your lab results and make any changes if needed Continue your current meds- they're working! Call with any questions or concerns Happy Holidays!!

## 2012-12-27 ENCOUNTER — Encounter: Payer: Self-pay | Admitting: Family Medicine

## 2012-12-31 ENCOUNTER — Telehealth: Payer: Self-pay | Admitting: *Deleted

## 2012-12-31 ENCOUNTER — Encounter: Payer: Self-pay | Admitting: General Practice

## 2012-12-31 MED ORDER — DICYCLOMINE HCL 20 MG PO TABS: 20.0000 mg | ORAL_TABLET | Freq: Four times a day (QID) | ORAL | Status: DC

## 2012-12-31 NOTE — Telephone Encounter (Signed)
Patient's daughter called and stated that her mother is having some irritable bowels issues. Daughter  phone died and tried to return call but didn't get an answer. Please advise. SW

## 2012-12-31 NOTE — Telephone Encounter (Signed)
Med filled to [facility]. Pt notified through [facility].

## 2012-12-31 NOTE — Telephone Encounter (Signed)
Pt can have Bentyl 20mg  TID prn.  #60, 1 refill.  Only to use for abd spasms.  Immodium prn for diarrhea.

## 2013-01-15 ENCOUNTER — Encounter: Payer: Self-pay | Admitting: Family Medicine

## 2013-02-08 ENCOUNTER — Telehealth: Payer: Self-pay | Admitting: *Deleted

## 2013-02-08 ENCOUNTER — Other Ambulatory Visit: Payer: Self-pay | Admitting: *Deleted

## 2013-02-08 MED ORDER — CLONAZEPAM 0.5 MG PO TABS: ORAL_TABLET | ORAL | Status: DC

## 2013-02-08 NOTE — Telephone Encounter (Signed)
clonazePAM (KLONOPIN) 0.5 MG tablet Last Refill: 08/02/12 #60, 1 refill Last OV: 12/12/12 UDS up-to-date

## 2013-02-08 NOTE — Telephone Encounter (Signed)
Printed, awaiting signature. JG//CMA 

## 2013-02-08 NOTE — Telephone Encounter (Signed)
Refill x1 

## 2013-02-20 ENCOUNTER — Encounter: Payer: Self-pay | Admitting: Family Medicine

## 2013-02-20 DIAGNOSIS — H9190 Unspecified hearing loss, unspecified ear: Secondary | ICD-10-CM

## 2013-02-20 NOTE — Telephone Encounter (Signed)
Referral placed.

## 2013-03-13 ENCOUNTER — Encounter: Payer: Self-pay | Admitting: Family Medicine

## 2013-03-13 ENCOUNTER — Ambulatory Visit (INDEPENDENT_AMBULATORY_CARE_PROVIDER_SITE_OTHER): Payer: Medicare Other | Admitting: Family Medicine

## 2013-03-13 ENCOUNTER — Telehealth: Payer: Self-pay | Admitting: Family Medicine

## 2013-03-13 VITALS — BP 132/62 | HR 72 | Temp 98.3°F | Resp 16 | Wt 199.2 lb

## 2013-03-13 DIAGNOSIS — I10 Essential (primary) hypertension: Secondary | ICD-10-CM

## 2013-03-13 DIAGNOSIS — F418 Other specified anxiety disorders: Secondary | ICD-10-CM

## 2013-03-13 DIAGNOSIS — F341 Dysthymic disorder: Secondary | ICD-10-CM

## 2013-03-13 DIAGNOSIS — E781 Pure hyperglyceridemia: Secondary | ICD-10-CM | POA: Insufficient documentation

## 2013-03-13 LAB — BASIC METABOLIC PANEL
BUN: 16 mg/dL (ref 6–23)
CALCIUM: 8.9 mg/dL (ref 8.4–10.5)
CO2: 26 mEq/L (ref 19–32)
Chloride: 104 mEq/L (ref 96–112)
Creatinine, Ser: 0.8 mg/dL (ref 0.4–1.2)
GFR: 79.39 mL/min (ref >60.00)
Glucose, Bld: 86 mg/dL (ref 70–99)
Potassium: 4.1 mEq/L (ref 3.5–5.1)
SODIUM: 137 meq/L (ref 135–145)

## 2013-03-13 LAB — LIPID PANEL
Cholesterol: 214 mg/dL — ABNORMAL HIGH (ref 0–200)
HDL: 36.7 mg/dL — AB (ref >39.00)
LDL Cholesterol: 128 mg/dL — ABNORMAL HIGH (ref 0–99)
TRIGLYCERIDES: 249 mg/dL — AB (ref 0.0–149.0)
Total CHOL/HDL Ratio: 6
VLDL: 49.8 mg/dL — ABNORMAL HIGH (ref 0.0–40.0)

## 2013-03-13 LAB — HEPATIC FUNCTION PANEL
ALBUMIN: 4.1 g/dL (ref 3.5–5.2)
ALT: 15 U/L (ref 0–35)
AST: 17 U/L (ref 0–37)
Alkaline Phosphatase: 74 U/L (ref 39–117)
Bilirubin, Direct: 0.1 mg/dL (ref 0.0–0.3)
Total Bilirubin: 0.5 mg/dL (ref 0.3–1.2)
Total Protein: 7.2 g/dL (ref 6.0–8.3)

## 2013-03-13 NOTE — Telephone Encounter (Signed)
Relevant patient education assigned to patient using Emmi. ° °

## 2013-03-13 NOTE — Assessment & Plan Note (Signed)
Improved w/ passing of husband and ease of care giver burden.  Pt is doing well both physically and emotionally.  No changes in meds at this time.  Will follow.

## 2013-03-13 NOTE — Patient Instructions (Signed)
Schedule your complete physical in September We'll notify you of your lab results and make any changes if needed Keep up the good work!  You look great! Call with any questions or concerns You're Amazing!!!

## 2013-03-13 NOTE — Progress Notes (Signed)
Pre visit review using our clinic review tool, if applicable. No additional management support is needed unless otherwise documented below in the visit note. 

## 2013-03-13 NOTE — Progress Notes (Signed)
   Subjective:    Patient ID: Samantha Briggs, female    DOB: Nov 30, 1932, 80 y.o.   MRN: [id number]  HPI HTN- chronic problem, on Diovan, Toprol, HCTZ.  Denies CP, SOB, HAs, visual changes, edema.  Hypertriglyceridemia- noted on labs done in Sept.  Has not been exercising regularly.  Depression- husband passed away 12/24, has moved to a new home.  Has strong faith, is keeping busy.  On Zoloft daily.  Sleeping better.   Review of Systems For ROS see HPI     Objective:   Physical Exam  Vitals reviewed. Constitutional: She is oriented to person, place, and time. She appears well-developed and well-nourished. No distress.  HENT:  Head: Normocephalic and atraumatic.  Eyes: Conjunctivae and EOM are normal. Pupils are equal, round, and reactive to light.  Neck: Normal range of motion. Neck supple. No thyromegaly present.  Cardiovascular: Normal rate, regular rhythm, normal heart sounds and intact distal pulses.   No murmur heard. Pulmonary/Chest: Effort normal and breath sounds normal. No respiratory distress.  Abdominal: Soft. She exhibits no distension. There is no tenderness.  Musculoskeletal: She exhibits no edema.  Lymphadenopathy:    She has no cervical adenopathy.  Neurological: She is alert and oriented to person, place, and time.  Skin: Skin is warm and dry.  Psychiatric: She has a normal mood and affect. Her behavior is normal.          Assessment & Plan:

## 2013-03-13 NOTE — Assessment & Plan Note (Signed)
New.  Noted on labs done in Sept.  Pt was attempting to work on low fat, healthy diet and trying to get more regular activity.  Recheck labs.  Start meds prn.

## 2013-03-13 NOTE — Assessment & Plan Note (Signed)
Chronic problem.  Adequate control.  Asymptomatic.  Check labs.  No anticipated med changes.  Will follow. 

## 2013-03-14 ENCOUNTER — Other Ambulatory Visit: Payer: Self-pay | Admitting: General Practice

## 2013-03-14 MED ORDER — FENOFIBRATE 160 MG PO TABS
160.0000 mg | ORAL_TABLET | Freq: Every day | ORAL | Status: DC
Start: 2013-03-14 — End: 2013-06-17

## 2013-04-17 ENCOUNTER — Other Ambulatory Visit: Payer: Self-pay | Admitting: Family Medicine

## 2013-04-17 NOTE — Telephone Encounter (Signed)
Med filled.  

## 2013-04-30 ENCOUNTER — Other Ambulatory Visit: Payer: Self-pay | Admitting: Family Medicine

## 2013-05-01 NOTE — Telephone Encounter (Signed)
Med filled.  

## 2013-05-02 ENCOUNTER — Other Ambulatory Visit: Payer: Self-pay | Admitting: Family Medicine

## 2013-05-02 NOTE — Telephone Encounter (Signed)
Med filled.  

## 2013-05-10 ENCOUNTER — Other Ambulatory Visit: Payer: Self-pay | Admitting: Family Medicine

## 2013-05-10 NOTE — Telephone Encounter (Signed)
Med filled and faxed.  

## 2013-05-10 NOTE — Telephone Encounter (Signed)
Last ov 03-13-13 Med filled 02-08-13 #60 with 1  Low risk

## 2013-06-11 ENCOUNTER — Other Ambulatory Visit: Payer: Self-pay | Admitting: Family Medicine

## 2013-06-11 NOTE — Telephone Encounter (Signed)
Med filled.  

## 2013-06-17 ENCOUNTER — Encounter: Payer: Self-pay | Admitting: Family Medicine

## 2013-06-17 ENCOUNTER — Ambulatory Visit (INDEPENDENT_AMBULATORY_CARE_PROVIDER_SITE_OTHER): Payer: Medicare Other | Admitting: Family Medicine

## 2013-06-17 VITALS — BP 138/88 | HR 99 | Temp 98.1°F | Resp 16 | Wt 194.4 lb

## 2013-06-17 DIAGNOSIS — K219 Gastro-esophageal reflux disease without esophagitis: Secondary | ICD-10-CM

## 2013-06-17 DIAGNOSIS — E781 Pure hyperglyceridemia: Secondary | ICD-10-CM

## 2013-06-17 DIAGNOSIS — M545 Low back pain, unspecified: Secondary | ICD-10-CM

## 2013-06-17 DIAGNOSIS — M109 Gout, unspecified: Secondary | ICD-10-CM

## 2013-06-17 DIAGNOSIS — K589 Irritable bowel syndrome without diarrhea: Secondary | ICD-10-CM | POA: Insufficient documentation

## 2013-06-17 NOTE — Progress Notes (Signed)
   Subjective:    Patient ID: Samantha Briggs, female    DOB: September 13, 1932, 80 y.o.   MRN: [id number]  HPI Back pain- was w/ great grandson last weekend and rather than bending at the knees to pick him up, bent over and picked him up- causing pain.  Taking ibuprofen for 2-3 days w/ some improvement (400mg  3x/day).  Pain is L lower back, radiating into buttock.  No weakness/numbness of legs.  Worse w/ movement.  Depression- pt still having husband w/ husband's death.  When she gets very upset, or 'gets really down' will have diarrhea.  On Zoloft daily.  Taking Klonopin as needed.  Hyperlipidemia- pt has had elevated trigs x9 months.  Was started on Fenofibrate.  Pt would like to stop this medication and see how #s improve.  Was taking fish oil and stopped this recently.  Gout- pt reports she has completely changed diet.  Wants to stop Allopurinol.  GERD- pt reports sxs are few and far between.  Would like to attempt to control w/ Tums or other OTC acid reducer.  Wants to stop daily medication.   Review of Systems For ROS see HPI     Objective:   Physical Exam  Vitals reviewed. Constitutional: She is oriented to person, place, and time. She appears well-developed and well-nourished. No distress.  HENT:  Head: Normocephalic and atraumatic.  Cardiovascular: Intact distal pulses.   Musculoskeletal: She exhibits tenderness (TTP over lumbar paraspinal muscles bilaterally). She exhibits no edema.  Neurological: She is alert and oriented to person, place, and time. She has normal reflexes. No cranial nerve deficit. Coordination normal.  Skin: Skin is warm and dry.  Psychiatric: She has a normal mood and affect. Her behavior is normal.          Assessment & Plan:

## 2013-06-17 NOTE — Assessment & Plan Note (Signed)
Pt has been on low dose omeprazole daily but would like to stop daily medication and treat sxs as they arise.  Told her this was reasonable but if she started having symptoms on more days than not, we need to restart a daily controller medication.  Pt expressed understanding and is in agreement w/ plan.

## 2013-06-17 NOTE — Assessment & Plan Note (Signed)
New.  Pt's sxs consistent w/ muscle strain/spasm.  Continue NSAIDs, heating pad.  No red flags on hx or PE.  Reviewed supportive care and red flags that should prompt return.  Pt expressed understanding and is in agreement w/ plan.

## 2013-06-17 NOTE — Progress Notes (Signed)
Pre visit review using our clinic review tool, if applicable. No additional management support is needed unless otherwise documented below in the visit note. 

## 2013-06-17 NOTE — Patient Instructions (Signed)
Follow up as scheduled Continue the ibuprofen 3x/day HEAT! Gentle stretching to avoid stiffness STOP the Allopurinol, Fenofibrate, Omeprazole Call with any questions or concerns Hang in there!!!

## 2013-06-17 NOTE — Assessment & Plan Note (Signed)
Pt has changed diet and would like to try to control trigs w/o medication.  Will stop fenofibrate and monitor for improvement in #s.  Pt expressed understanding and is in agreement w/ plan.

## 2013-06-17 NOTE — Assessment & Plan Note (Signed)
Pt reports that she has not had any symptoms recently and has 'totally changed my diet'.  Wants to stop her allopurinol.  Cautioned her that this could trigger a flare.  Pt to call if she develops gout symptoms so we can determine if she needs to restart this.  Pt expressed understanding and is in agreement w/ plan.

## 2013-06-17 NOTE — Assessment & Plan Note (Signed)
New.  Pt has bentyl available to use as needed.  sxs are diarrhea predominate.  Start immodium once daily and subsequent doses prn.  Pt expressed understanding and is in agreement w/ plan.

## 2013-08-05 ENCOUNTER — Other Ambulatory Visit: Payer: Self-pay | Admitting: Family Medicine

## 2013-08-05 NOTE — Telephone Encounter (Signed)
Last OV 06-17-13 Clonazepam filled 05-10-13 #60 with 0  Upcoming appt in September with Tabori, Low risk

## 2013-08-06 ENCOUNTER — Telehealth: Payer: Self-pay | Admitting: General Practice

## 2013-08-06 NOTE — Telephone Encounter (Signed)
Last OV 06-17-13 Med filled 05-10-13 #60 with 0  Low risk

## 2013-08-07 NOTE — Telephone Encounter (Signed)
Med filled and faxed.  

## 2013-08-07 NOTE — Telephone Encounter (Signed)
Pt notified this was faxed today.

## 2013-08-07 NOTE — Telephone Encounter (Signed)
Can refill as requested. 

## 2013-08-07 NOTE — Telephone Encounter (Signed)
Pt called back about this.  Going out of town tomorrow.

## 2013-09-23 ENCOUNTER — Encounter: Payer: Medicare Other | Admitting: Family Medicine

## 2013-09-30 ENCOUNTER — Telehealth: Payer: Self-pay | Admitting: Medical

## 2013-09-30 ENCOUNTER — Other Ambulatory Visit: Payer: Self-pay | Admitting: General Practice

## 2013-09-30 MED ORDER — SERTRALINE HCL 50 MG PO TABS: ORAL_TABLET | ORAL | Status: DC

## 2013-10-01 NOTE — Telephone Encounter (Signed)
Rx refill sent to me. Gave to Dr. Beverely Lowabori since on my review I never saw pt.

## 2013-10-02 ENCOUNTER — Telehealth: Payer: Self-pay

## 2013-10-02 ENCOUNTER — Other Ambulatory Visit: Payer: Self-pay | Admitting: Family Medicine

## 2013-10-02 NOTE — Telephone Encounter (Signed)
Ok for #60, no refills 

## 2013-10-02 NOTE — Telephone Encounter (Signed)
Med filled.  

## 2013-10-02 NOTE — Telephone Encounter (Signed)
Last OV 06-17-13 Med filled 05-10-13 #60 with 0

## 2013-10-03 MED ORDER — CLONAZEPAM 0.5 MG PO TABS: ORAL_TABLET | ORAL | Status: DC

## 2013-10-03 NOTE — Telephone Encounter (Signed)
Med filled and faxed.  

## 2013-10-04 ENCOUNTER — Telehealth: Payer: Self-pay | Admitting: Family Medicine

## 2013-10-04 NOTE — Telephone Encounter (Signed)
Caller name: Cristy FriedlanderFlorence  Call back number: [phone] [facility]   Reason for call:  Pt is requesting a refill on Rx clonazePAM (KLONOPIN) 0.5 MG tablet . Call when completed.

## 2013-10-04 NOTE — Telephone Encounter (Signed)
Medication was filled and faxed on 10/03/13.  Called patient and made her aware.  No further needs at this time.

## 2013-10-23 ENCOUNTER — Telehealth: Payer: Self-pay | Admitting: Family Medicine

## 2013-10-23 NOTE — Telephone Encounter (Signed)
Called and left a detailed message for pt, explaining the need to check with insurance company to find out if they prefer her to have tdap in our office or at pharmacy due to cost/

## 2013-10-23 NOTE — Telephone Encounter (Signed)
She has had the diptheria part of the shot, she is probably asking about the pertussis component.  It is recommended for those being around small children but it may not be covered by her insurance.  We would be happy to give it to her but she needs to know about the possible cost

## 2013-10-23 NOTE — Telephone Encounter (Signed)
Caller name: Cristy FriedlanderFlorence Relation to pt: Call back number: [phone] Pharmacy:  Reason for call: Pt wanting to know if needed a dipthyria shot. Pt states having a great grandchild soon and wants to know if necessary. Please advise.

## 2013-10-28 ENCOUNTER — Ambulatory Visit (INDEPENDENT_AMBULATORY_CARE_PROVIDER_SITE_OTHER): Payer: Medicare Other | Admitting: Family Medicine

## 2013-10-28 ENCOUNTER — Encounter: Payer: Self-pay | Admitting: Family Medicine

## 2013-10-28 VITALS — BP 110/78 | HR 74 | Temp 98.1°F | Resp 16 | Wt 198.0 lb

## 2013-10-28 DIAGNOSIS — R5383 Other fatigue: Secondary | ICD-10-CM

## 2013-10-28 DIAGNOSIS — Z23 Encounter for immunization: Secondary | ICD-10-CM

## 2013-10-28 DIAGNOSIS — F418 Other specified anxiety disorders: Secondary | ICD-10-CM

## 2013-10-28 DIAGNOSIS — I10 Essential (primary) hypertension: Secondary | ICD-10-CM

## 2013-10-28 LAB — BASIC METABOLIC PANEL
BUN: 17 mg/dL (ref 6–23)
CHLORIDE: 106 meq/L (ref 96–112)
CO2: 26 meq/L (ref 19–32)
Calcium: 8.9 mg/dL (ref 8.4–10.5)
Creatinine, Ser: 0.8 mg/dL (ref 0.4–1.2)
GFR: 75.75 mL/min (ref >60.00)
Glucose, Bld: 84 mg/dL (ref 70–99)
Potassium: 4.1 mEq/L (ref 3.5–5.1)
Sodium: 139 mEq/L (ref 135–145)

## 2013-10-28 LAB — HEPATIC FUNCTION PANEL
ALK PHOS: 57 U/L (ref 39–117)
ALT: 13 U/L (ref 0–35)
AST: 20 U/L (ref 0–37)
Albumin: 3.5 g/dL (ref 3.5–5.2)
BILIRUBIN DIRECT: 0 mg/dL (ref 0.0–0.3)
Total Bilirubin: 0.5 mg/dL (ref 0.2–1.2)
Total Protein: 7.5 g/dL (ref 6.0–8.3)

## 2013-10-28 LAB — LIPID PANEL
CHOL/HDL RATIO: 6
Cholesterol: 216 mg/dL — ABNORMAL HIGH (ref 0–200)
HDL: 36.2 mg/dL — ABNORMAL LOW (ref >39.00)
NONHDL: 179.8
Triglycerides: 235 mg/dL — ABNORMAL HIGH (ref 0.0–149.0)
VLDL: 47 mg/dL — ABNORMAL HIGH (ref 0.0–40.0)

## 2013-10-28 LAB — TSH: TSH: 4.21 u[IU]/mL (ref 0.35–4.50)

## 2013-10-28 LAB — CBC WITH DIFFERENTIAL/PLATELET
BASOS ABS: 0 10*3/uL (ref 0.0–0.1)
BASOS PCT: 0.5 % (ref 0.0–3.0)
Eosinophils Absolute: 0.1 10*3/uL (ref 0.0–0.7)
Eosinophils Relative: 1.3 % (ref 0.0–5.0)
HEMATOCRIT: 38.4 % (ref 36.0–46.0)
HEMOGLOBIN: 12.5 g/dL (ref 12.0–15.0)
LYMPHS ABS: 1.7 10*3/uL (ref 0.7–4.0)
Lymphocytes Relative: 28.2 % (ref 12.0–46.0)
MCHC: 32.5 g/dL (ref 30.0–36.0)
MCV: 87.5 fl (ref 78.0–100.0)
MONO ABS: 0.4 10*3/uL (ref 0.1–1.0)
Monocytes Relative: 6.1 % (ref 3.0–12.0)
NEUTROS ABS: 3.8 10*3/uL (ref 1.4–7.7)
Neutrophils Relative %: 63.9 % (ref 43.0–77.0)
Platelets: 180 10*3/uL (ref 150.0–400.0)
RBC: 4.39 Mil/uL (ref 3.87–5.11)
RDW: 14.7 % (ref 11.5–15.5)
WBC: 6 10*3/uL (ref 4.0–10.5)

## 2013-10-28 NOTE — Progress Notes (Signed)
Pre visit review using our clinic review tool, if applicable. No additional management support is needed unless otherwise documented below in the visit note. 

## 2013-10-28 NOTE — Progress Notes (Signed)
   Subjective:    Patient ID: Samantha Briggs, female    DOB: [birth year], 80 y.o.   MRN: [id number]  Dizziness Associated symptoms include headaches.  Headache  Associated symptoms include dizziness.   HTN- chronic problem.  On HCTZ, Valsartan, Metoprolol.  Denies CP, SOB, HAs, visual changes, edema.  Depression- much improved, now a deacon in the church, volunteering at school, 'very busy'.  'i've accepted John's death- I'm in a good place'.  Fatigue- pt reports intermittent fatigue that will require her to lie down.  HR at times is in the 50s.  BP is well controlled.  Will get dizzy when bending over and standing back up.   Review of Systems  Neurological: Positive for dizziness and headaches.   For ROS see HPI     Objective:   Physical Exam  Vitals reviewed. Constitutional: She is oriented to person, place, and time. She appears well-developed and well-nourished. No distress.  HENT:  Head: Normocephalic and atraumatic.  Eyes: Conjunctivae and EOM are normal. Pupils are equal, round, and reactive to light.  Neck: Normal range of motion. Neck supple. No thyromegaly present.  Cardiovascular: Normal rate, regular rhythm, normal heart sounds and intact distal pulses.   No murmur heard. Pulmonary/Chest: Effort normal and breath sounds normal. No respiratory distress.  Abdominal: Soft. She exhibits no distension. There is no tenderness.  Musculoskeletal: She exhibits no edema.  Lymphadenopathy:    She has no cervical adenopathy.  Neurological: She is alert and oriented to person, place, and time.  Skin: Skin is warm and dry.  Psychiatric: She has a normal mood and affect. Her behavior is normal.          Assessment & Plan:

## 2013-10-28 NOTE — Patient Instructions (Signed)
Follow up in 4-6 weeks to recheck BP STOP the Metoprolol (one of your BP meds) We'll notify you of your lab results and make any changes if needed Keep up the good work! Call with any questions or concerns Happy Fall!!!

## 2013-10-29 ENCOUNTER — Other Ambulatory Visit: Payer: Self-pay | Admitting: General Practice

## 2013-10-29 DIAGNOSIS — E785 Hyperlipidemia, unspecified: Secondary | ICD-10-CM

## 2013-10-29 LAB — LDL CHOLESTEROL, DIRECT: Direct LDL: 149.3 mg/dL

## 2013-10-29 MED ORDER — SIMVASTATIN 20 MG PO TABS: 20.0000 mg | ORAL_TABLET | Freq: Every day | ORAL | Status: DC

## 2013-10-29 NOTE — Assessment & Plan Note (Signed)
Chronic problem.  Well controlled.  Actually having hypotensive episodes.  Due to fatigue and dizziness w/ standing, will d/c Metoprolol.  Reviewed supportive care and red flags that should prompt return.  Pt expressed understanding and is in agreement w/ plan.

## 2013-10-29 NOTE — Assessment & Plan Note (Signed)
Recurrent problem for pt.  Suspect this is due to pt's beta blocker and mild hypotension or symptomatic bradycardia.  Check labs to r/o underlying metabolic causes.  D/c beta blocker.  Reviewed supportive care and red flags that should prompt return.  Pt expressed understanding and is in agreement w/ plan.

## 2013-10-29 NOTE — Assessment & Plan Note (Signed)
Much improved.  Pt is very busy and spending time w/ family and friends since husband's death nearly 1 year ago.  Has 'come to terms with it'.  Will continue to follow.  No changes at this time.

## 2013-11-19 ENCOUNTER — Encounter: Payer: Self-pay | Admitting: Physician Assistant

## 2013-11-19 ENCOUNTER — Ambulatory Visit (INDEPENDENT_AMBULATORY_CARE_PROVIDER_SITE_OTHER): Payer: Medicare Other | Admitting: Physician Assistant

## 2013-11-19 VITALS — BP 132/69 | HR 82 | Temp 98.0°F | Resp 16 | Ht 65.5 in | Wt 194.2 lb

## 2013-11-19 DIAGNOSIS — D509 Iron deficiency anemia, unspecified: Secondary | ICD-10-CM

## 2013-11-19 DIAGNOSIS — K219 Gastro-esophageal reflux disease without esophagitis: Secondary | ICD-10-CM

## 2013-11-19 DIAGNOSIS — E781 Pure hyperglyceridemia: Secondary | ICD-10-CM

## 2013-11-19 LAB — BASIC METABOLIC PANEL
BUN: 18 mg/dL (ref 6–23)
CO2: 22 meq/L (ref 19–32)
Calcium: 8.8 mg/dL (ref 8.4–10.5)
Chloride: 108 mEq/L (ref 96–112)
Creatinine, Ser: 0.8 mg/dL (ref 0.4–1.2)
GFR: 78.05 mL/min (ref >60.00)
GLUCOSE: 92 mg/dL (ref 70–99)
POTASSIUM: 4.2 meq/L (ref 3.5–5.1)
Sodium: 140 mEq/L (ref 135–145)

## 2013-11-19 LAB — CBC
HCT: 39.1 % (ref 36.0–46.0)
Hemoglobin: 12.7 g/dL (ref 12.0–15.0)
MCHC: 32.4 g/dL (ref 30.0–36.0)
MCV: 87.6 fl (ref 78.0–100.0)
PLATELETS: 218 10*3/uL (ref 150.0–400.0)
RBC: 4.47 Mil/uL (ref 3.87–5.11)
RDW: 14 % (ref 11.5–15.5)
WBC: 5.9 10*3/uL (ref 4.0–10.5)

## 2013-11-19 LAB — FERRITIN: Ferritin: 33.2 ng/mL (ref 10.0–291.0)

## 2013-11-19 MED ORDER — LOVASTATIN 40 MG PO TABS: ORAL_TABLET | ORAL | Status: DC

## 2013-11-19 MED ORDER — OMEPRAZOLE 20 MG PO CPDR: 20.0000 mg | DELAYED_RELEASE_CAPSULE | Freq: Every day | ORAL | Status: DC

## 2013-11-19 NOTE — Assessment & Plan Note (Signed)
Resume Prilosec 20 mg daily. Increase fluid intake.  GERD diet discussed with patient.  Will check labs today.  Follow-up in 2 weeks.

## 2013-11-19 NOTE — Progress Notes (Signed)
Pre visit review using our clinic review tool, if applicable. No additional management support is needed unless otherwise documented below in the visit note/SLS  

## 2013-11-19 NOTE — Patient Instructions (Signed)
Please stop the Simvastatin and begin the Mevacor (lovastatin), taking 1/2 tablet by mouth daily for 1 week.  Then increase to 1 tablet daily.  Take Prilosec daily as directed.  Increase your fluid intake.  I will call you with your results.  Follow-up with Dr. Beverely Lowabori in 2 weeks.  Food Choices for Gastroesophageal Reflux Disease When you have gastroesophageal reflux disease (GERD), the foods you eat and your eating habits are very important. Choosing the right foods can help ease the discomfort of GERD. WHAT GENERAL GUIDELINES DO I NEED TO FOLLOW?  Choose fruits, vegetables, whole grains, low-fat dairy products, and low-fat meat, fish, and poultry.  Limit fats such as oils, salad dressings, butter, nuts, and avocado.  Keep a food diary to identify foods that cause symptoms.  Avoid foods that cause reflux. These may be different for different people.  Eat frequent small meals instead of three large meals each day.  Eat your meals slowly, in a relaxed setting.  Limit fried foods.  Cook foods using methods other than frying.  Avoid drinking alcohol.  Avoid drinking large amounts of liquids with your meals.  Avoid bending over or lying down until 2-3 hours after eating. WHAT FOODS ARE NOT RECOMMENDED? The following are some foods and drinks that may worsen your symptoms: Vegetables Tomatoes. Tomato juice. Tomato and spaghetti sauce. Chili peppers. Onion and garlic. Horseradish. Fruits Oranges, grapefruit, and lemon (fruit and juice). Meats High-fat meats, fish, and poultry. This includes hot dogs, ribs, ham, sausage, salami, and bacon. Dairy Whole milk and chocolate milk. Sour cream. Cream. Butter. Ice cream. Cream cheese.  Beverages Coffee and tea, with or without caffeine. Carbonated beverages or energy drinks. Condiments Hot sauce. Barbecue sauce.  Sweets/Desserts Chocolate and cocoa. Donuts. Peppermint and spearmint. Fats and Oils High-fat foods, including Jamaica[place] fries and  potato chips. Other Vinegar. Strong spices, such as black pepper, white pepper, red pepper, cayenne, curry powder, cloves, ginger, and chili powder. The items listed above may not be a complete list of foods and beverages to avoid. Contact your dietitian for more information. Document Released: 12/20/2004 Document Revised: 12/25/2012 Document Reviewed: 10/24/2012 [facility] Patient Information 2015 [employer] [place] [place]. This information is not intended to replace advice given to you by your health care provider. Make sure you discuss any questions you have with your health care provider.

## 2013-11-19 NOTE — Assessment & Plan Note (Signed)
Will repeat CBC to assess if leg aches are solely secondary to statin therapy or if IDA is present.

## 2013-11-19 NOTE — Progress Notes (Signed)
Patient with PMH significant for IBS and GERD presents to clinic today c/o heart burn and reflux associated with some bloating noted over the past few weeks.  States symptoms have been more pronounced since starting Simvastatin.  Is also endorses muscle and joint aches since starting the medication.  Patient denies abdominal pain, tenesmus, melena or hematochezia.  Denies NSAID use of alcohol consumption.  Is trying to watch her diet.   Past Medical History  Diagnosis Date  . Anemia   . Depression   . Glaucoma   . Allergy   . Hypertension     Current Outpatient Prescriptions on File Prior to Visit  Medication Sig Dispense Refill  . aspirin 81 MG tablet Take 81 mg by mouth daily.      . clonazePAM (KLONOPIN) 0.5 MG tablet Take 1 tablet by mouth twice daily as needed for anxiety. 60 tablet 0  . hydrochlorothiazide (HYDRODIURIL) 12.5 MG tablet TAKE ONE TABLET BY MOUTH ONE TIME DAILY  90 tablet 2  . latanoprost (XALATAN) 0.005 % ophthalmic solution Place 1 drop into the left eye daily.     Marland Kitchen. PRED MILD 0.12 % ophthalmic suspension Place 1 drop into both eyes.     Marland Kitchen. sertraline (ZOLOFT) 50 MG tablet TAKE ONE TABLET BY MOUTH ONE TIME DAILY 30 tablet 4  . valsartan (DIOVAN) 320 MG tablet TAKE ONE TABLET BY MOUTH ONE TIME DAILY  30 tablet 4  . Iron Combinations (IRON COMPLEX PO) Take 65 mg by mouth daily.     No current facility-administered medications on file prior to visit.    Allergies  Allergen Reactions  . Morphine And Related Hives  . Neurontin [Gabapentin]     hives    No family history on file.  History   Social History  . Marital Status: Married    Spouse Name: N/A    Number of Children: N/A  . Years of Education: N/A   Social History Main Topics  . Smoking status: Former Games developermoker  . Smokeless tobacco: Never Used  . Alcohol Use: No  . Drug Use: None  . Sexual Activity: None   Other Topics Concern  . None   Social History Narrative    Review of Systems - See  HPI.  All other ROS are negative.  BP 132/69 mmHg  Pulse 82  Temp(Src) 98 F (36.7 C) (Oral)  Resp 16  Ht 5' 5.5" (1.664 m)  Wt 194 lb 4 oz (88.111 kg)  BMI 31.82 kg/m2  SpO2 97%  Physical Exam  Constitutional: She is oriented to person, place, and time and well-developed, well-nourished, and in no distress.  HENT:  Head: Normocephalic and atraumatic.  Eyes: Conjunctivae are normal. Pupils are equal, round, and reactive to light.  Neck: Neck supple.  Cardiovascular: Normal rate, regular rhythm, normal heart sounds and intact distal pulses.   Pulmonary/Chest: Effort normal and breath sounds normal. No respiratory distress. She has no wheezes. She has no rales. She exhibits no tenderness.  Abdominal: Soft. Bowel sounds are normal. She exhibits no distension and no mass. There is no tenderness. There is no rebound and no guarding.  Neurological: She is alert and oriented to person, place, and time.  Skin: Skin is warm and dry. No rash noted.  Psychiatric: Affect normal.  Vitals reviewed.   Recent Results (from the past 2160 hour(s))  Basic metabolic panel     Status: None   Collection Time: 10/28/13  1:31 PM  Result Value Ref Range  Sodium 139 135 - 145 mEq/L   Potassium 4.1 3.5 - 5.1 mEq/L   Chloride 106 96 - 112 mEq/L   CO2 26 19 - 32 mEq/L   Glucose, Bld 84 70 - 99 mg/dL   BUN 17 6 - 23 mg/dL   Creatinine, Ser 0.8 0.4 - 1.2 mg/dL   Calcium 8.9 8.4 - 16.110.5 mg/dL   GFR 09.6075.75 >45.40>60.00 mL/min  Lipid panel     Status: Abnormal   Collection Time: 10/28/13  1:31 PM  Result Value Ref Range   Cholesterol 216 (H) 0 - 200 mg/dL    Comment: ATP III Classification       Desirable:  < 200 mg/dL               Borderline High:  200 - 239 mg/dL          High:  > = 981240 mg/dL   Triglycerides 191.4235.0 (H) 0.0 - 149.0 mg/dL    Comment: Normal:  <782<150 mg/dLBorderline High:  150 - 199 mg/dL   HDL 95.6236.20 (L) >13.08>39.00 mg/dL   VLDL 65.747.0 (H) 0.0 - 84.640.0 mg/dL   Total CHOL/HDL Ratio 6     Comment:                 Men          Women1/2 Average Risk     3.4          3.3Average Risk          5.0          4.42X Average Risk          9.6          7.13X Average Risk          15.0          11.0                       NonHDL 179.80     Comment: NOTE:  Non-HDL goal should be 30 mg/dL higher than patient's LDL goal (i.e. LDL goal of < 70 mg/dL, would have non-HDL goal of < 100 mg/dL)  Hepatic function panel     Status: None   Collection Time: 10/28/13  1:31 PM  Result Value Ref Range   Total Bilirubin 0.5 0.2 - 1.2 mg/dL   Bilirubin, Direct 0.0 0.0 - 0.3 mg/dL   Alkaline Phosphatase 57 39 - 117 U/L   AST 20 0 - 37 U/L   ALT 13 0 - 35 U/L   Total Protein 7.5 6.0 - 8.3 g/dL   Albumin 3.5 3.5 - 5.2 g/dL  TSH     Status: None   Collection Time: 10/28/13  1:31 PM  Result Value Ref Range   TSH 4.21 0.35 - 4.50 uIU/mL  CBC with Differential     Status: None   Collection Time: 10/28/13  1:31 PM  Result Value Ref Range   WBC 6.0 4.0 - 10.5 K/uL   RBC 4.39 3.87 - 5.11 Mil/uL   Hemoglobin 12.5 12.0 - 15.0 g/dL   HCT 96.238.4 95.236.0 - 84.146.0 %   MCV 87.5 78.0 - 100.0 fl   MCHC 32.5 30.0 - 36.0 g/dL   RDW 32.414.7 40.111.5 - 02.715.5 %   Platelets 180.0 150.0 - 400.0 K/uL   Neutrophils Relative % 63.9 43.0 - 77.0 %   Lymphocytes Relative 28.2 12.0 - 46.0 %   Monocytes Relative 6.1 3.0 - 12.0 %  Eosinophils Relative 1.3 0.0 - 5.0 %   Basophils Relative 0.5 0.0 - 3.0 %   Neutro Abs 3.8 1.4 - 7.7 K/uL   Lymphs Abs 1.7 0.7 - 4.0 K/uL   Monocytes Absolute 0.4 0.1 - 1.0 K/uL   Eosinophils Absolute 0.1 0.0 - 0.7 K/uL   Basophils Absolute 0.0 0.0 - 0.1 K/uL  LDL cholesterol, direct     Status: None   Collection Time: 10/28/13  1:31 PM  Result Value Ref Range   Direct LDL 149.3 mg/dL    Comment: Optimal:  <161 mg/dLNear or Above Optimal:  100-129 mg/dLBorderline High:  130-159 mg/dLHigh:  160-189 mg/dLVery High:  >190 mg/dL    Assessment/Plan: Anemia, iron deficiency Will repeat CBC to assess if leg aches are solely  secondary to statin therapy or if IDA is present.  Hypertriglyceridemia Will d/c simvastatin due to myalgias.  Will begin Mevacor 40 mg -- Patient to take 20 mg daily for 1 week. Then increase to 40 mg daily.  Start CoQ 10 supplement daily.   Follow-up with Dr. Beverely Low in 2 weeks.  GERD (gastroesophageal reflux disease) Resume Prilosec 20 mg daily. Increase fluid intake.  GERD diet discussed with patient.  Will check labs today.  Follow-up in 2 weeks.

## 2013-11-19 NOTE — Assessment & Plan Note (Signed)
Will d/c simvastatin due to myalgias.  Will begin Mevacor 40 mg -- Patient to take 20 mg daily for 1 week. Then increase to 40 mg daily.  Start CoQ 10 supplement daily.   Follow-up with Dr. Beverely Lowabori in 2 weeks.

## 2013-12-02 ENCOUNTER — Ambulatory Visit: Payer: Medicare Other | Admitting: Family Medicine

## 2013-12-06 ENCOUNTER — Other Ambulatory Visit: Payer: Self-pay | Admitting: Family Medicine

## 2013-12-06 NOTE — Telephone Encounter (Signed)
Last OV 10-28-13 Med filled 10-03-13 #60 with 0

## 2013-12-06 NOTE — Telephone Encounter (Signed)
Med filled and faxed.  

## 2013-12-09 ENCOUNTER — Other Ambulatory Visit: Payer: Medicare Other

## 2013-12-24 ENCOUNTER — Encounter: Payer: Medicare Other | Admitting: Family Medicine

## 2014-01-23 DIAGNOSIS — H4011X3 Primary open-angle glaucoma, severe stage: Secondary | ICD-10-CM | POA: Diagnosis not present

## 2014-01-23 DIAGNOSIS — Z961 Presence of intraocular lens: Secondary | ICD-10-CM | POA: Diagnosis not present

## 2014-01-23 DIAGNOSIS — Z947 Corneal transplant status: Secondary | ICD-10-CM | POA: Diagnosis not present

## 2014-02-17 ENCOUNTER — Telehealth: Payer: Self-pay | Admitting: Family Medicine

## 2014-02-17 NOTE — Telephone Encounter (Signed)
Ok for Immodium.  There is a GI bug going around.  If no improvement, will need OV when weather improves

## 2014-02-17 NOTE — Telephone Encounter (Signed)
Caller name:Samantha Briggs Relationship to patient:Self Can be reached:home #   Reason for call:PT states that starting Sunday morning around 2 am nausea and loose stools. Wanting to know if she can take immodium- unable to come into the office today due to weather. PT states no fever-

## 2014-02-17 NOTE — Telephone Encounter (Signed)
Pt notified and expressed an understanding.  

## 2014-02-18 ENCOUNTER — Encounter: Payer: Self-pay | Admitting: Family

## 2014-02-18 ENCOUNTER — Ambulatory Visit (INDEPENDENT_AMBULATORY_CARE_PROVIDER_SITE_OTHER): Payer: Medicare Other | Admitting: Family

## 2014-02-18 VITALS — BP 100/60 | HR 88 | Temp 97.9°F | Resp 16 | Ht 63.5 in | Wt 191.0 lb

## 2014-02-18 DIAGNOSIS — R197 Diarrhea, unspecified: Secondary | ICD-10-CM | POA: Diagnosis not present

## 2014-02-18 DIAGNOSIS — A084 Viral intestinal infection, unspecified: Secondary | ICD-10-CM | POA: Diagnosis not present

## 2014-02-18 LAB — HEPATIC FUNCTION PANEL
ALBUMIN: 4.3 g/dL (ref 3.5–5.2)
ALT: 22 U/L (ref 0–35)
AST: 23 U/L (ref 0–37)
Alkaline Phosphatase: 66 U/L (ref 39–117)
Bilirubin, Direct: 0 mg/dL (ref 0.0–0.3)
Total Bilirubin: 0.4 mg/dL (ref 0.2–1.2)
Total Protein: 7.6 g/dL (ref 6.0–8.3)

## 2014-02-18 LAB — CBC WITH DIFFERENTIAL/PLATELET
BASOS ABS: 0 10*3/uL (ref 0.0–0.1)
Basophils Relative: 0.2 % (ref 0.0–3.0)
EOS ABS: 0 10*3/uL (ref 0.0–0.7)
EOS PCT: 0.3 % (ref 0.0–5.0)
HEMATOCRIT: 42.1 % (ref 36.0–46.0)
Hemoglobin: 13.9 g/dL (ref 12.0–15.0)
LYMPHS ABS: 1.5 10*3/uL (ref 0.7–4.0)
Lymphocytes Relative: 24.2 % (ref 12.0–46.0)
MCHC: 33.1 g/dL (ref 30.0–36.0)
MCV: 82.6 fl (ref 78.0–100.0)
MONO ABS: 0.6 10*3/uL (ref 0.1–1.0)
Monocytes Relative: 9.3 % (ref 3.0–12.0)
Neutro Abs: 4.1 10*3/uL (ref 1.4–7.7)
Neutrophils Relative %: 66 % (ref 43.0–77.0)
Platelets: 266 10*3/uL (ref 150.0–400.0)
RBC: 5.1 Mil/uL (ref 3.87–5.11)
RDW: 15.1 % (ref 11.5–15.5)
WBC: 6.3 10*3/uL (ref 4.0–10.5)

## 2014-02-18 LAB — BASIC METABOLIC PANEL
BUN: 23 mg/dL (ref 6–23)
CALCIUM: 9.6 mg/dL (ref 8.4–10.5)
CO2: 29 mEq/L (ref 19–32)
CREATININE: 1.01 mg/dL (ref 0.40–1.20)
Chloride: 102 mEq/L (ref 96–112)
GFR: 56.18 mL/min — AB (ref >60.00)
Glucose, Bld: 108 mg/dL — ABNORMAL HIGH (ref 70–99)
Potassium: 3.8 mEq/L (ref 3.5–5.1)
Sodium: 139 mEq/L (ref 135–145)

## 2014-02-18 NOTE — Progress Notes (Signed)
Subjective:    Patient ID: Samantha Briggs, female    DOB: 09/11/1932, 81 y.o.   MRN: [id number]  HPI  Samantha Briggs is a 81 yr old female who presents today with chief complaint of diarrhea. Started Sunday AM 2AM, initially vomiting, then diarrhea.  Now just experiencing diarrhea. Denies recent antibiotics. She denies known sick contacts but is around a lot of people.  No vomitting. She has mild associated nausea currently.  Since midnight (it is 2PM) she has had 4 loose stools.  She is drinking water and eating crackers/rice.  She denies fever or abdominal pain.  Has not tried any imodium.    Review of Systems See HPI  Past Medical History  Diagnosis Date  . Anemia   . Depression   . Glaucoma   . Allergy   . Hypertension     History   Social History  . Marital Status: Married    Spouse Name: N/A  . Number of Children: N/A  . Years of Education: N/A   Occupational History  . Not on file.   Social History Main Topics  . Smoking status: Former Games developer  . Smokeless tobacco: Never Used  . Alcohol Use: No  . Drug Use: Not on file  . Sexual Activity: Not on file   Other Topics Concern  . Not on file   Social History Narrative    Past Surgical History  Procedure Laterality Date  . Cholecystectomy    . Dilation and curettage of uterus    . Retinal detachment surgery    . Corneal transplant      x2    History reviewed. No pertinent family history.  Allergies  Allergen Reactions  . Morphine And Related Hives  . Neurontin [Gabapentin]     hives    Current Outpatient Prescriptions on File Prior to Visit  Medication Sig Dispense Refill  . aspirin 81 MG tablet Take 81 mg by mouth daily.      . clonazePAM (KLONOPIN) 0.5 MG tablet TAKE ONE TABLET BY MOUTH TWICE A DAY AS NEEDED FOR ANXIETY  60 tablet 1  . hydrochlorothiazide (HYDRODIURIL) 12.5 MG tablet TAKE ONE TABLET BY MOUTH ONE TIME DAILY  90 tablet 2  . Iron Combinations (IRON COMPLEX PO) Take 65 mg by  mouth daily.    Marland Kitchen latanoprost (XALATAN) 0.005 % ophthalmic solution Place 1 drop into the left eye daily.     Marland Kitchen lovastatin (MEVACOR) 40 MG tablet Take 1/2 tablet by mouth daily for 1 week.  Then increase to 1 tablet daily. 90 tablet 3  . omeprazole (PRILOSEC) 20 MG capsule Take 1 capsule (20 mg total) by mouth daily. 30 capsule 3  . PRED MILD 0.12 % ophthalmic suspension Place 1 drop into both eyes.     Marland Kitchen sertraline (ZOLOFT) 50 MG tablet TAKE ONE TABLET BY MOUTH ONE TIME DAILY 30 tablet 4  . valsartan (DIOVAN) 320 MG tablet TAKE ONE TABLET BY MOUTH ONE TIME DAILY  30 tablet 4   No current facility-administered medications on file prior to visit.    BP 100/60 mmHg  Pulse 88  Temp(Src) 97.9 F (36.6 C) (Oral)  Resp 16  Ht 5' 3.5" (1.613 m)  Wt 191 lb (86.637 kg)  BMI 33.30 kg/m2  SpO2 98%       Objective:   Physical Exam  Constitutional: She is oriented to person, place, and time. She appears well-developed and well-nourished.  HENT:  Head: Normocephalic and atraumatic.  Dry  oral mucosa  Cardiovascular: Normal rate, regular rhythm and normal heart sounds.   No murmur heard. Pulmonary/Chest: Effort normal and breath sounds normal. No respiratory distress. She has no wheezes.  Abdominal: Soft. She exhibits no distension. Bowel sounds are increased. There is no tenderness.  Neurological: She is alert and oriented to person, place, and time.  Psychiatric: She has a normal mood and affect. Her behavior is normal. Judgment and thought content normal.          Assessment & Plan:

## 2014-02-18 NOTE — Progress Notes (Signed)
Pre visit review using our clinic review tool, if applicable. No additional management support is needed unless otherwise documented below in the visit note. 

## 2014-02-18 NOTE — Patient Instructions (Addendum)
Please complete lab work prior to leaving. Hold hctz until your diarrhea has completely resolved.  You may use imodium as needed for diarrhea. Drink plently of liquids (water and gatorade) to avoid becoming more dehydrated. Call if your diarrhea worsens or if it does not continue to improve over the next 1-2 days. Go to ER if worsening weakness, fever, worsening diarrhea.  Viral Gastroenteritis Viral gastroenteritis is also known as stomach flu. This condition affects the stomach and intestinal tract. It can cause sudden diarrhea and vomiting. The illness typically lasts 3 to 8 days. Most people develop an immune response that eventually gets rid of the virus. While this natural response develops, the virus can make you quite ill. CAUSES  Many different viruses can cause gastroenteritis, such as rotavirus or noroviruses. You can catch one of these viruses by consuming contaminated food or water. You may also catch a virus by sharing utensils or other personal items with an infected person or by touching a contaminated surface. SYMPTOMS  The most common symptoms are diarrhea and vomiting. These problems can cause a severe loss of body fluids (dehydration) and a body salt (electrolyte) imbalance. Other symptoms may include:  Fever.  Headache.  Fatigue.  Abdominal pain. DIAGNOSIS  Your caregiver can usually diagnose viral gastroenteritis based on your symptoms and a physical exam. A stool sample may also be taken to test for the presence of viruses or other infections. TREATMENT  This illness typically goes away on its own. Treatments are aimed at rehydration. The most serious cases of viral gastroenteritis involve vomiting so severely that you are not able to keep fluids down. In these cases, fluids must be given through an intravenous line (IV). HOME CARE INSTRUCTIONS   Drink enough fluids to keep your urine clear or pale yellow. Drink small amounts of fluids frequently and increase the  amounts as tolerated.  Ask your caregiver for specific rehydration instructions.  Avoid:  Foods high in sugar.  Alcohol.  Carbonated drinks.  Tobacco.  Juice.  Caffeine drinks.  Extremely hot or cold fluids.  Fatty, greasy foods.  Too much intake of anything at one time.  Dairy products until 24 to 48 hours after diarrhea stops.  You may consume probiotics. Probiotics are active cultures of beneficial bacteria. They may lessen the amount and number of diarrheal stools in adults. Probiotics can be found in yogurt with active cultures and in supplements.  Wash your hands well to avoid spreading the virus.  Only take over-the-counter or prescription medicines for pain, discomfort, or fever as directed by your caregiver. Do not give aspirin to children. Antidiarrheal medicines are not recommended.  Ask your caregiver if you should continue to take your regular prescribed and over-the-counter medicines.  Keep all follow-up appointments as directed by your caregiver. SEEK IMMEDIATE MEDICAL CARE IF:   You are unable to keep fluids down.  You do not urinate at least once every 6 to 8 hours.  You develop shortness of breath.  You notice blood in your stool or vomit. This may look like coffee grounds.  You have abdominal pain that increases or is concentrated in one small area (localized).  You have persistent vomiting or diarrhea.  You have a fever.  The patient is a child younger than 3 months, and he or she has a fever.  The patient is a child older than 3 months, and he or she has a fever and persistent symptoms.  The patient is a child older than 3 months,  and he or she has a fever and symptoms suddenly get worse.  The patient is a baby, and he or she has no tears when crying. MAKE SURE YOU:   Understand these instructions.  Will watch your condition.  Will get help right away if you are not doing well or get worse. Document Released: 12/20/2004 Document  Revised: 03/14/2011 Document Reviewed: 10/06/2010 [facility] Patient Information 2015 [employer], [place]. This information is not intended to replace advice given to you by your health care provider. Make sure you discuss any questions you have with your health care provider.

## 2014-02-18 NOTE — Assessment & Plan Note (Signed)
Symptoms most consistent with resolving viral gastroenteritis.   Advised pt as follows: Hold hctz until your diarrhea has completely resolved.  You may use imodium as needed for diarrhea. Drink plently of liquids (water and gatorade) to avoid becoming more dehydrated. Call if your diarrhea worsens or if it does not continue to improve over the next 1-2 days. Go to ER if worsening weakness, fever, worsening diarrhea.

## 2014-02-19 ENCOUNTER — Encounter: Payer: Self-pay | Admitting: Family

## 2014-02-20 ENCOUNTER — Telehealth: Payer: Self-pay | Admitting: Family Medicine

## 2014-02-20 NOTE — Telephone Encounter (Signed)
Caller name:Hunn, Naria Relation to GE:XBMWpt:self Call back number:[phone] Pharmacy:  Reason for call: pt saw Melissa on Tuesday and would like to know if her blood work is back. States she is still not feeling well from the stomach virus

## 2014-02-20 NOTE — Telephone Encounter (Signed)
Please advise.//AB/CMA 

## 2014-02-21 NOTE — Telephone Encounter (Signed)
Left detailed message on home # to call with response to below questions.

## 2014-02-21 NOTE — Telephone Encounter (Signed)
Patient returned call. [phone]

## 2014-02-21 NOTE — Telephone Encounter (Signed)
Noted  

## 2014-02-21 NOTE — Telephone Encounter (Signed)
Spoke with pt and notified of her labs. She states that she is drinking 16oz gatorade, 4-5 glasses water and 1 herbal tea daily. Notes that she ate a piece of baked fish yesterday (no n/v) "Stool is trying to form". Notes that she is feeling better today.

## 2014-02-21 NOTE — Telephone Encounter (Signed)
Lab work looks good.  Is she drinking fluids?  How many loose BM's yesterday?

## 2014-03-04 ENCOUNTER — Encounter: Payer: Self-pay | Admitting: Family Medicine

## 2014-03-04 ENCOUNTER — Ambulatory Visit (INDEPENDENT_AMBULATORY_CARE_PROVIDER_SITE_OTHER): Payer: Medicare Other | Admitting: Family Medicine

## 2014-03-04 VITALS — BP 130/78 | HR 76 | Temp 97.9°F | Resp 16 | Wt 201.0 lb

## 2014-03-04 DIAGNOSIS — M10279 Drug-induced gout, unspecified ankle and foot: Secondary | ICD-10-CM

## 2014-03-04 LAB — URIC ACID: URIC ACID, SERUM: 7.2 mg/dL — AB (ref 2.4–7.0)

## 2014-03-04 MED ORDER — INDOMETHACIN 50 MG PO CAPS: ORAL_CAPSULE | ORAL | Status: DC

## 2014-03-04 NOTE — Patient Instructions (Signed)
Follow up as needed We'll notify you of your lab results and make any changes if needed Start the Indomethacin as directed.  As the pain is tolerable, decrease from 3x/day, to 2x/day, to daily and then stop as able Ice and elevate your foot Drink plenty of fluids Call with any questions or concerns Hang in there!

## 2014-03-04 NOTE — Progress Notes (Signed)
Pre visit review using our clinic review tool, if applicable. No additional management support is needed unless otherwise documented below in the visit note. 

## 2014-03-04 NOTE — Progress Notes (Signed)
   Subjective:    Patient ID: Samantha Briggs, female    DOB: 20-Oct-1932, 81 y.o.   MRN: [id number]  HPI Gout- pt woke up this AM w/ L great toe swollen and painful.  Had shrimp on Sunday- no other dietary changes.  Stopped HCTZ recently w/ GI bug but restarted recently.   Review of Systems For ROS see HPI     Objective:   Physical Exam  Constitutional: She appears well-developed and well-nourished. No distress.  Musculoskeletal: She exhibits edema (L great toe) and tenderness (L great toe).  Skin: Skin is warm and dry. There is erythema (L great toe).  Vitals reviewed.         Assessment & Plan:

## 2014-03-04 NOTE — Assessment & Plan Note (Signed)
Deteriorated.  Suspect it is due to pt's recent dietary changes and restarting HCTZ.  tx w/ indomethacin.  Reviewed supportive care and red flags that should prompt return.  Pt expressed understanding and is in agreement w/ plan.

## 2014-03-07 ENCOUNTER — Other Ambulatory Visit: Payer: Self-pay | Admitting: Family Medicine

## 2014-03-07 NOTE — Telephone Encounter (Signed)
Med filled.  

## 2014-03-18 ENCOUNTER — Other Ambulatory Visit: Payer: Self-pay | Admitting: Physician Assistant

## 2014-03-21 ENCOUNTER — Telehealth: Payer: Self-pay | Admitting: Family Medicine

## 2014-03-21 ENCOUNTER — Other Ambulatory Visit: Payer: Self-pay | Admitting: Family Medicine

## 2014-03-21 NOTE — Telephone Encounter (Signed)
Med filled and faxed.  

## 2014-03-21 NOTE — Telephone Encounter (Signed)
Pt notified and expressed an understanding.  

## 2014-03-21 NOTE — Telephone Encounter (Signed)
Caller name: Nissi Relation to pt: self Call back number: [phone] [facility] [address]  Reason for call:   Patient states that she has been having an itchy, sneezing, and runny nose. She wants to know what OTC medication she can take that wouldn't interfere with her bp medication

## 2014-03-21 NOTE — Telephone Encounter (Signed)
Pre Visit letter sent  °

## 2014-03-21 NOTE — Telephone Encounter (Signed)
Zyrtec  

## 2014-03-21 NOTE — Telephone Encounter (Signed)
Last OV 03-04-14 Clonazepam last filled 12-06-13 #60 with 1

## 2014-04-03 ENCOUNTER — Other Ambulatory Visit: Payer: Self-pay | Admitting: Family Medicine

## 2014-04-03 NOTE — Telephone Encounter (Signed)
Med filled.  

## 2014-04-09 ENCOUNTER — Telehealth: Payer: Self-pay

## 2014-04-09 NOTE — Telephone Encounter (Signed)
Called and confirmed appointment. Completed Previsit questionnaire. Pt states she has gotten hearing aids since last physical.

## 2014-04-10 ENCOUNTER — Ambulatory Visit (INDEPENDENT_AMBULATORY_CARE_PROVIDER_SITE_OTHER): Payer: Medicare Other | Admitting: Family Medicine

## 2014-04-10 ENCOUNTER — Encounter: Payer: Self-pay | Admitting: Family Medicine

## 2014-04-10 VITALS — BP 120/80 | HR 87 | Temp 97.9°F | Resp 16 | Ht 64.0 in | Wt 196.5 lb

## 2014-04-10 DIAGNOSIS — F418 Other specified anxiety disorders: Secondary | ICD-10-CM

## 2014-04-10 DIAGNOSIS — Z23 Encounter for immunization: Secondary | ICD-10-CM | POA: Diagnosis not present

## 2014-04-10 DIAGNOSIS — I10 Essential (primary) hypertension: Secondary | ICD-10-CM | POA: Diagnosis not present

## 2014-04-10 DIAGNOSIS — E781 Pure hyperglyceridemia: Secondary | ICD-10-CM | POA: Diagnosis not present

## 2014-04-10 DIAGNOSIS — Z0000 Encounter for general adult medical examination without abnormal findings: Secondary | ICD-10-CM

## 2014-04-10 DIAGNOSIS — D509 Iron deficiency anemia, unspecified: Secondary | ICD-10-CM | POA: Diagnosis not present

## 2014-04-10 LAB — BASIC METABOLIC PANEL
BUN: 16 mg/dL (ref 6–23)
CHLORIDE: 104 meq/L (ref 96–112)
CO2: 28 mEq/L (ref 19–32)
CREATININE: 0.8 mg/dL (ref 0.40–1.20)
Calcium: 9.4 mg/dL (ref 8.4–10.5)
GFR: 73.49 mL/min (ref >60.00)
Glucose, Bld: 100 mg/dL — ABNORMAL HIGH (ref 70–99)
Potassium: 3.9 mEq/L (ref 3.5–5.1)
Sodium: 138 mEq/L (ref 135–145)

## 2014-04-10 LAB — CBC WITH DIFFERENTIAL/PLATELET
BASOS ABS: 0 10*3/uL (ref 0.0–0.1)
Basophils Relative: 0.5 % (ref 0.0–3.0)
EOS ABS: 0.1 10*3/uL (ref 0.0–0.7)
EOS PCT: 1.5 % (ref 0.0–5.0)
HEMATOCRIT: 38.1 % (ref 36.0–46.0)
Hemoglobin: 12.6 g/dL (ref 12.0–15.0)
LYMPHS ABS: 1.5 10*3/uL (ref 0.7–4.0)
Lymphocytes Relative: 27.1 % (ref 12.0–46.0)
MCHC: 33.1 g/dL (ref 30.0–36.0)
MCV: 80.9 fl (ref 78.0–100.0)
MONOS PCT: 6.3 % (ref 3.0–12.0)
Monocytes Absolute: 0.3 10*3/uL (ref 0.1–1.0)
NEUTROS ABS: 3.5 10*3/uL (ref 1.4–7.7)
Neutrophils Relative %: 64.6 % (ref 43.0–77.0)
Platelets: 224 10*3/uL (ref 150.0–400.0)
RBC: 4.71 Mil/uL (ref 3.87–5.11)
RDW: 15.9 % — ABNORMAL HIGH (ref 11.5–15.5)
WBC: 5.4 10*3/uL (ref 4.0–10.5)

## 2014-04-10 LAB — LIPID PANEL
CHOL/HDL RATIO: 6
Cholesterol: 206 mg/dL — ABNORMAL HIGH (ref 0–200)
HDL: 37.4 mg/dL — ABNORMAL LOW (ref >39.00)
LDL Cholesterol: 130 mg/dL — ABNORMAL HIGH (ref 0–99)
NONHDL: 168.6
Triglycerides: 194 mg/dL — ABNORMAL HIGH (ref 0.0–149.0)
VLDL: 38.8 mg/dL (ref 0.0–40.0)

## 2014-04-10 LAB — HEPATIC FUNCTION PANEL
ALK PHOS: 74 U/L (ref 39–117)
ALT: 13 U/L (ref 0–35)
AST: 15 U/L (ref 0–37)
Albumin: 4 g/dL (ref 3.5–5.2)
BILIRUBIN DIRECT: 0.1 mg/dL (ref 0.0–0.3)
TOTAL PROTEIN: 7.3 g/dL (ref 6.0–8.3)
Total Bilirubin: 0.4 mg/dL (ref 0.2–1.2)

## 2014-04-10 LAB — TSH: TSH: 4.82 u[IU]/mL — AB (ref 0.35–4.50)

## 2014-04-10 MED ORDER — HYDROCHLOROTHIAZIDE 12.5 MG PO TABS: 12.5000 mg | ORAL_TABLET | Freq: Every day | ORAL | Status: DC

## 2014-04-10 MED ORDER — SERTRALINE HCL 50 MG PO TABS
50.0000 mg | ORAL_TABLET | Freq: Every day | ORAL | Status: DC
Start: 2014-04-10 — End: 2014-08-05

## 2014-04-10 MED ORDER — OMEPRAZOLE 20 MG PO CPDR: 20.0000 mg | DELAYED_RELEASE_CAPSULE | Freq: Every day | ORAL | Status: DC

## 2014-04-10 NOTE — Patient Instructions (Signed)
Follow up in 6 months to recheck BP and cholesterol We'll notify you of your lab results and make any changes if needed Keep up the good work!  You look great!! Call with any questions or concerns Happy Spring!!! 

## 2014-04-10 NOTE — Assessment & Plan Note (Signed)
Pt's PE WNL.  Due for mammo but pt declines.  UTD on colonoscopy and DEXA.  Written screening schedule updated and given to pt.  Check labs.  Anticipatory guidance provided.

## 2014-04-10 NOTE — Assessment & Plan Note (Signed)
Chronic problem.  BP is well controlled.  Asymptomatic.  Check labs.  No anticipated med changes. 

## 2014-04-10 NOTE — Assessment & Plan Note (Signed)
Chronic problem.  Stable on current meds.  Pt currently asymptomatic.

## 2014-04-10 NOTE — Assessment & Plan Note (Signed)
Chronic problem.  Pt stopped taking statin as she wants to control w/ healthy diet and regular exercise.  Check labs.  Restart meds prn.

## 2014-04-10 NOTE — Progress Notes (Signed)
Pre visit review using our clinic review tool, if applicable. No additional management support is needed unless otherwise documented below in the visit note. 

## 2014-04-10 NOTE — Progress Notes (Signed)
   Subjective:    Patient ID: Samantha Briggs, female    DOB: August 17, 1932, 81 y.o.   MRN: [id number]  HPI Here today for CPE.  Risk Factors: HTN- chronic problem, on Diovan, HCTZ. Hyperlipidemia- chronic problem, on Lovastatin but not currently taking.  Pt wanted to attempt to control w/ healthy diet and states if levels are elevated, she will resume meds. Anemia- chronic problem, on iron supplement Physical Activity: walking regularly Fall Risk: low Depression: chronic problem, well controlled on Zoloft Hearing: improved w/ recent hearing aides ADL's: independent Cognitive: normal linear thought process, memory and attention intact Home Safety: safe at home Height, Weight, BMI, Visual Acuity: see vitals, vision corrected to 20/20 w/ glasses Counseling: UTD on colonoscopy, DEXA, no need for pap, due for mammo- pt declines.  'if i have it at my age, I'm not going to do anything about it'. Labs Ordered: See A&P Care Plan: See A&P    Review of Systems Patient reports no vision/ hearing changes, adenopathy,fever, weight change,  persistant/recurrent hoarseness , swallowing issues, chest pain, palpitations, edema, persistant/recurrent cough, hemoptysis, dyspnea (rest/exertional/paroxysmal nocturnal), gastrointestinal bleeding (melena, rectal bleeding), abdominal pain, significant heartburn, bowel changes, GU symptoms (dysuria, hematuria, incontinence), Gyn symptoms (abnormal  bleeding, pain),  syncope, focal weakness, memory loss, numbness & tingling, skin/hair/nail changes, abnormal bruising or bleeding, anxiety, or depression.     Objective:   Physical Exam General Appearance:    Alert, cooperative, no distress, appears stated age  Head:    Normocephalic, without obvious abnormality, atraumatic  Eyes:    PERRL, conjunctiva/corneas clear, EOM's intact, fundi    benign, both eyes  Ears:    Normal TM's and external ear canals, both ears  Nose:   Nares normal, septum midline, mucosa  normal, no drainage    or sinus tenderness  Throat:   Lips, mucosa, and tongue normal; teeth and gums normal  Neck:   Supple, symmetrical, trachea midline, no adenopathy;    Thyroid: no enlargement/tenderness/nodules  Back:     Symmetric, no curvature, ROM normal, no CVA tenderness  Lungs:     Clear to auscultation bilaterally, respirations unlabored  Chest Wall:    No tenderness or deformity   Heart:    Regular rate and rhythm, S1 and S2 normal, no murmur, rub   or gallop  Breast Exam:    Deferred at pt's request  Abdomen:     Soft, non-tender, bowel sounds active all four quadrants,    no masses, no organomegaly  Genitalia:    Deferred at pt's request  Rectal:    Extremities:   Extremities normal, atraumatic, no cyanosis or edema  Pulses:   2+ and symmetric all extremities  Skin:   Skin color, texture, turgor normal, no rashes or lesions  Lymph nodes:   Cervical, supraclavicular, and axillary nodes normal  Neurologic:   CNII-XII intact, normal strength, sensation and reflexes    throughout          Assessment & Plan:

## 2014-04-10 NOTE — Assessment & Plan Note (Signed)
Chronic problem.  Pt on OTC supplement.  Check labs.  Adjust tx prn.

## 2014-04-11 ENCOUNTER — Other Ambulatory Visit: Payer: Self-pay | Admitting: General Practice

## 2014-04-11 DIAGNOSIS — R7989 Other specified abnormal findings of blood chemistry: Secondary | ICD-10-CM

## 2014-04-11 MED ORDER — LEVOTHYROXINE SODIUM 50 MCG PO TABS: 50.0000 ug | ORAL_TABLET | Freq: Every day | ORAL | Status: DC

## 2014-05-06 ENCOUNTER — Other Ambulatory Visit (INDEPENDENT_AMBULATORY_CARE_PROVIDER_SITE_OTHER): Payer: Medicare Other

## 2014-05-06 ENCOUNTER — Encounter: Payer: Self-pay | Admitting: General Practice

## 2014-05-06 DIAGNOSIS — E781 Pure hyperglyceridemia: Secondary | ICD-10-CM | POA: Diagnosis not present

## 2014-05-06 DIAGNOSIS — R7989 Other specified abnormal findings of blood chemistry: Secondary | ICD-10-CM

## 2014-05-06 DIAGNOSIS — I10 Essential (primary) hypertension: Secondary | ICD-10-CM | POA: Diagnosis not present

## 2014-05-06 LAB — TSH: TSH: 3.14 u[IU]/mL (ref 0.35–4.50)

## 2014-05-28 ENCOUNTER — Other Ambulatory Visit: Payer: Self-pay | Admitting: Family Medicine

## 2014-05-28 NOTE — Telephone Encounter (Signed)
Last ov 04-10-14 Clonazepam last filled 03-21-14 #60 with 0

## 2014-05-28 NOTE — Telephone Encounter (Signed)
Med filled and faxed.  

## 2014-06-08 ENCOUNTER — Other Ambulatory Visit: Payer: Self-pay | Admitting: Family Medicine

## 2014-06-09 NOTE — Telephone Encounter (Signed)
Med filled.  

## 2014-07-23 DIAGNOSIS — H4011X3 Primary open-angle glaucoma, severe stage: Secondary | ICD-10-CM | POA: Diagnosis not present

## 2014-08-02 ENCOUNTER — Other Ambulatory Visit: Payer: Self-pay | Admitting: Family Medicine

## 2014-08-04 NOTE — Telephone Encounter (Signed)
Medication filled to pharmacy as requested.   

## 2014-08-05 ENCOUNTER — Other Ambulatory Visit: Payer: Self-pay | Admitting: Family Medicine

## 2014-08-05 NOTE — Telephone Encounter (Signed)
Medication filled to pharmacy as requested.   

## 2014-08-22 ENCOUNTER — Other Ambulatory Visit: Payer: Self-pay | Admitting: Family Medicine

## 2014-08-22 NOTE — Telephone Encounter (Signed)
Last OV 04/10/14 Clonazepam last filled 05/28/14 #60 with 0

## 2014-08-22 NOTE — Telephone Encounter (Signed)
Medication filled to pharmacy as requested.   

## 2014-09-04 ENCOUNTER — Telehealth: Payer: Self-pay | Admitting: Family Medicine

## 2014-09-04 NOTE — Telephone Encounter (Signed)
Pt will need OV to assess her fatigue so we can run labs as needed

## 2014-09-04 NOTE — Telephone Encounter (Signed)
Scheduled 9/2

## 2014-09-04 NOTE — Telephone Encounter (Signed)
Pt states that she is feeling very tired. She is wondering if it is from the medication related to thyroid. Maybe dose needs adjusted or change med. She is wondering if this is a side effect of medication.

## 2014-09-05 ENCOUNTER — Ambulatory Visit (INDEPENDENT_AMBULATORY_CARE_PROVIDER_SITE_OTHER): Payer: Medicare Other | Admitting: Family Medicine

## 2014-09-05 ENCOUNTER — Encounter: Payer: Self-pay | Admitting: Family Medicine

## 2014-09-05 VITALS — BP 126/74 | HR 66 | Temp 98.3°F | Resp 18 | Ht 64.0 in | Wt 206.0 lb

## 2014-09-05 DIAGNOSIS — R5383 Other fatigue: Secondary | ICD-10-CM | POA: Diagnosis not present

## 2014-09-05 DIAGNOSIS — J0100 Acute maxillary sinusitis, unspecified: Secondary | ICD-10-CM | POA: Diagnosis not present

## 2014-09-05 LAB — CBC WITH DIFFERENTIAL/PLATELET
Basophils Absolute: 0 10*3/uL (ref 0.0–0.1)
Basophils Relative: 0 % (ref 0–1)
EOS ABS: 0.1 10*3/uL (ref 0.0–0.7)
EOS PCT: 2 % (ref 0–5)
HEMATOCRIT: 35.5 % — AB (ref 36.0–46.0)
Hemoglobin: 11.5 g/dL — ABNORMAL LOW (ref 12.0–15.0)
LYMPHS PCT: 26 % (ref 12–46)
Lymphs Abs: 1.5 10*3/uL (ref 0.7–4.0)
MCH: 26.9 pg (ref 26.0–34.0)
MCHC: 32.4 g/dL (ref 30.0–36.0)
MCV: 83.1 fL (ref 78.0–100.0)
MONO ABS: 0.5 10*3/uL (ref 0.1–1.0)
MPV: 12 fL (ref 8.6–12.4)
Monocytes Relative: 8 % (ref 3–12)
Neutro Abs: 3.8 10*3/uL (ref 1.7–7.7)
Neutrophils Relative %: 64 % (ref 43–77)
PLATELETS: 205 10*3/uL (ref 150–400)
RBC: 4.27 MIL/uL (ref 3.87–5.11)
RDW: 15.2 % (ref 11.5–15.5)
WBC: 5.9 10*3/uL (ref 4.0–10.5)

## 2014-09-05 LAB — BASIC METABOLIC PANEL
BUN: 20 mg/dL (ref 7–25)
CALCIUM: 9.1 mg/dL (ref 8.6–10.4)
CHLORIDE: 103 mmol/L (ref 98–110)
CO2: 27 mmol/L (ref 20–31)
CREATININE: 0.67 mg/dL (ref 0.60–0.93)
Glucose, Bld: 93 mg/dL (ref 65–99)
Potassium: 4.1 mmol/L (ref 3.5–5.3)
Sodium: 143 mmol/L (ref 135–146)

## 2014-09-05 LAB — TSH: TSH: 2.664 u[IU]/mL (ref 0.350–4.500)

## 2014-09-05 MED ORDER — AMOXICILLIN 875 MG PO TABS: 875.0000 mg | ORAL_TABLET | Freq: Two times a day (BID) | ORAL | Status: DC

## 2014-09-05 MED ORDER — AMOXICILLIN-POT CLAVULANATE 875-125 MG PO TABS: 1.0000 | ORAL_TABLET | Freq: Two times a day (BID) | ORAL | Status: DC

## 2014-09-05 NOTE — Progress Notes (Signed)
Pre visit review using our clinic review tool, if applicable. No additional management support is needed unless otherwise documented below in the visit note. 

## 2014-09-05 NOTE — Progress Notes (Signed)
   Subjective:    Patient ID: Samantha Briggs, female    DOB: January 14, 1932, 81 y.o.   MRN: [id number]  HPI Fatigue- 'i am so tired'.  sxs started 'a couple of weeks ago'.  Pt was concerned about thyroid medication.  Pt has gained 10 lbs.  Sleeping well at night.  Pt has been very busy recently and did not tolerate the heat very well.  + body aches, nasal congestion, + maxillary pain and pressure.  + frontal pain.  Went to eye doctor b/c of pain behind her eyes.  No fevers.     Review of Systems For ROS see HPI     Objective:   Physical Exam  Constitutional: She is oriented to person, place, and time. She appears well-developed and well-nourished. No distress.  HENT:  Head: Normocephalic and atraumatic.  Right Ear: Tympanic membrane normal.  Left Ear: Tympanic membrane normal.  Nose: Mucosal edema and rhinorrhea present. Right sinus exhibits maxillary sinus tenderness and frontal sinus tenderness. Left sinus exhibits maxillary sinus tenderness and frontal sinus tenderness.  Mouth/Throat: Uvula is midline and mucous membranes are normal. Posterior oropharyngeal erythema present. No oropharyngeal exudate.  Eyes: Conjunctivae and EOM are normal. Pupils are equal, round, and reactive to light.  Neck: Normal range of motion. Neck supple.  Cardiovascular: Normal rate, regular rhythm, normal heart sounds and intact distal pulses.   Pulmonary/Chest: Effort normal and breath sounds normal. No respiratory distress. She has no wheezes.  Musculoskeletal: She exhibits no edema.  Lymphadenopathy:    She has no cervical adenopathy.  Neurological: She is alert and oriented to person, place, and time.  Skin: Skin is warm and dry.  Psychiatric: She has a normal mood and affect. Her behavior is normal. Thought content normal.  Vitals reviewed.         Assessment & Plan:

## 2014-09-05 NOTE — Patient Instructions (Signed)
Follow up as needed We'll notify you of your lab results and make any changes if needed Keep up the good work on healthy diet and regular exercise- you look great! Learn to say no!!! Start the Amoxicillin twice daily- take w/ food- for the sinus infection Drink plenty of fluids Start OTC Claritin or Zyrtec for the allergy component Call with any questions or concerns Happy Labor Day!!!

## 2014-09-07 NOTE — Assessment & Plan Note (Signed)
Pt's sxs and PE consistent w/ infxn.  Start abx.  Reviewed supportive care and red flags that should prompt return.  Pt expressed understanding and is in agreement w/ plan.  

## 2014-09-07 NOTE — Assessment & Plan Note (Signed)
Intermittent issue for pt.  Suspect this is combination of pt overdoing it this summer in combination w/ extreme heat/humidity and now illness.  Check labs to r/o metabolic cause.  Will follow.

## 2014-09-08 ENCOUNTER — Other Ambulatory Visit: Payer: Self-pay | Admitting: Family Medicine

## 2014-09-09 ENCOUNTER — Other Ambulatory Visit: Payer: Self-pay | Admitting: Family Medicine

## 2014-09-09 ENCOUNTER — Telehealth: Payer: Self-pay | Admitting: Family Medicine

## 2014-09-09 ENCOUNTER — Encounter: Payer: Self-pay | Admitting: General Practice

## 2014-09-09 DIAGNOSIS — D508 Other iron deficiency anemias: Secondary | ICD-10-CM

## 2014-09-09 NOTE — Telephone Encounter (Signed)
Called and informed pt of lab results.  

## 2014-09-09 NOTE — Telephone Encounter (Signed)
Medication filled to pharmacy as requested.   

## 2014-09-09 NOTE — Telephone Encounter (Signed)
Pt calling for lab results and out of thyroid meds. Was waiting to see if there is a dose adjustment. Please call [phone].

## 2014-09-12 ENCOUNTER — Telehealth: Payer: Self-pay | Admitting: Family Medicine

## 2014-09-12 NOTE — Telephone Encounter (Signed)
Augmentin is notorious for diarrhea.  She should take w/ food and either start a daily probiotic or eat yogurt regularly to keep good bacteria levels intact

## 2014-09-12 NOTE — Telephone Encounter (Signed)
Pt called to notify us that the augmentin is giving her diarrhea. It starts out as normal stools but thru the day gets watery.  Pt also eats a lot of seeds, flax, wheat breads, etc. Could that be causing issues too?

## 2014-09-12 NOTE — Telephone Encounter (Signed)
Called and informed pt of PCP advice. Pt stated an understanding.

## 2014-10-13 ENCOUNTER — Telehealth: Payer: Self-pay | Admitting: *Deleted

## 2014-10-13 MED ORDER — CLONAZEPAM 0.5 MG PO TABS: 0.5000 mg | ORAL_TABLET | Freq: Two times a day (BID) | ORAL | Status: DC | PRN

## 2014-10-13 NOTE — Telephone Encounter (Signed)
Ok for #60, 1 refill 

## 2014-10-13 NOTE — Telephone Encounter (Signed)
Medication Detail      Disp Refills Start End     clonazePAM (KLONOPIN) 0.5 MG tablet 60 tablet 1 08/22/2014     Sig: TAKE ONE TABLET BY MOUTH TWICE DAILY AS NEEDED FOR ANXIETY    Class: Print    Notes to Pharmacy: Not to exceed 5 additional fills before 11/24/2014.   Pharmacy    [facility] [phone] [facility] - [place], [place] - [address]   LOV: 04.07.16 ROV: 6 Mths [Acute visit 09.02.16], F/U appt scheduled for 10.12.16 Please Advise on refills/SLS

## 2014-10-15 ENCOUNTER — Encounter: Payer: Self-pay | Admitting: Family Medicine

## 2014-10-15 ENCOUNTER — Ambulatory Visit (INDEPENDENT_AMBULATORY_CARE_PROVIDER_SITE_OTHER): Payer: Medicare Other | Admitting: Family Medicine

## 2014-10-15 VITALS — BP 128/82 | HR 86 | Temp 98.0°F | Resp 16 | Ht 64.0 in | Wt 200.5 lb

## 2014-10-15 DIAGNOSIS — E781 Pure hyperglyceridemia: Secondary | ICD-10-CM

## 2014-10-15 DIAGNOSIS — I10 Essential (primary) hypertension: Secondary | ICD-10-CM

## 2014-10-15 LAB — BASIC METABOLIC PANEL
BUN: 18 mg/dL (ref 6–23)
CALCIUM: 9.1 mg/dL (ref 8.4–10.5)
CO2: 27 meq/L (ref 19–32)
CREATININE: 0.73 mg/dL (ref 0.40–1.20)
Chloride: 106 mEq/L (ref 96–112)
GFR: 81.57 mL/min (ref >60.00)
Glucose, Bld: 91 mg/dL (ref 70–99)
Potassium: 3.2 mEq/L — ABNORMAL LOW (ref 3.5–5.1)
SODIUM: 141 meq/L (ref 135–145)

## 2014-10-15 LAB — HEPATIC FUNCTION PANEL
ALBUMIN: 4 g/dL (ref 3.5–5.2)
ALT: 14 U/L (ref 0–35)
AST: 15 U/L (ref 0–37)
Alkaline Phosphatase: 74 U/L (ref 39–117)
BILIRUBIN DIRECT: 0.1 mg/dL (ref 0.0–0.3)
BILIRUBIN TOTAL: 0.3 mg/dL (ref 0.2–1.2)
TOTAL PROTEIN: 7.1 g/dL (ref 6.0–8.3)

## 2014-10-15 LAB — CBC WITH DIFFERENTIAL/PLATELET
BASOS PCT: 0.6 % (ref 0.0–3.0)
Basophils Absolute: 0 10*3/uL (ref 0.0–0.1)
EOS PCT: 1.7 % (ref 0.0–5.0)
Eosinophils Absolute: 0.1 10*3/uL (ref 0.0–0.7)
HEMATOCRIT: 36.9 % (ref 36.0–46.0)
HEMOGLOBIN: 12 g/dL (ref 12.0–15.0)
LYMPHS PCT: 25.4 % (ref 12.0–46.0)
Lymphs Abs: 1.3 10*3/uL (ref 0.7–4.0)
MCHC: 32.6 g/dL (ref 30.0–36.0)
MCV: 81.6 fl (ref 78.0–100.0)
MONOS PCT: 6.7 % (ref 3.0–12.0)
Monocytes Absolute: 0.3 10*3/uL (ref 0.1–1.0)
NEUTROS ABS: 3.3 10*3/uL (ref 1.4–7.7)
Neutrophils Relative %: 65.6 % (ref 43.0–77.0)
PLATELETS: 204 10*3/uL (ref 150.0–400.0)
RBC: 4.52 Mil/uL (ref 3.87–5.11)
RDW: 15.7 % — AB (ref 11.5–15.5)
WBC: 5.1 10*3/uL (ref 4.0–10.5)

## 2014-10-15 LAB — LIPID PANEL
CHOL/HDL RATIO: 6
Cholesterol: 192 mg/dL (ref 0–200)
HDL: 33.8 mg/dL — AB (ref >39.00)
NONHDL: 158.25
TRIGLYCERIDES: 274 mg/dL — AB (ref 0.0–149.0)
VLDL: 54.8 mg/dL — AB (ref 0.0–40.0)

## 2014-10-15 LAB — LDL CHOLESTEROL, DIRECT: Direct LDL: 124 mg/dL

## 2014-10-15 MED ORDER — CLONAZEPAM 0.5 MG PO TABS: 0.5000 mg | ORAL_TABLET | Freq: Two times a day (BID) | ORAL | Status: DC | PRN

## 2014-10-15 NOTE — Telephone Encounter (Signed)
Pharmacy called needing the refill faxed to them patient is waiting in the pharmacy

## 2014-10-15 NOTE — Progress Notes (Signed)
Pre visit review using our clinic review tool, if applicable. No additional management support is needed unless otherwise documented below in the visit note. 

## 2014-10-15 NOTE — Progress Notes (Signed)
   Subjective:    Patient ID: Caleen JobsFlorence M Clippard, female    DOB: October 15, 1932, 81 y.o.   MRN: [id number]  HPI HTN- chronic problem, on HCTZ, Valsartan daily.  Well controlled.  Denies CP, SOB, HAs, visual changes, edema.  Hyperlipidemia- chronic problem, no longer on Lovastatin daily.  Attempting to control w/ healthy diet and regular exercise.  Has lost 6 lbs.  Denies abd pain, N/V, myalgias.   Review of Systems For ROS see HPI     Objective:   Physical Exam  Constitutional: She is oriented to person, place, and time. She appears well-developed and well-nourished. No distress.  HENT:  Head: Normocephalic and atraumatic.  Eyes: Conjunctivae and EOM are normal. Pupils are equal, round, and reactive to light.  Neck: Normal range of motion. Neck supple. No thyromegaly present.  Cardiovascular: Normal rate, regular rhythm, normal heart sounds and intact distal pulses.   No murmur heard. Pulmonary/Chest: Effort normal and breath sounds normal. No respiratory distress.  Abdominal: Soft. She exhibits no distension. There is no tenderness.  Musculoskeletal: She exhibits no edema.  Lymphadenopathy:    She has no cervical adenopathy.  Neurological: She is alert and oriented to person, place, and time.  Skin: Skin is warm and dry.  Psychiatric: She has a normal mood and affect. Her behavior is normal.  Vitals reviewed.         Assessment & Plan:

## 2014-10-15 NOTE — Addendum Note (Signed)
Addended by: Geannie RisenBRODMERKEL, JESSICA L on: 10/15/2014 12:16 PM   Modules accepted: Orders

## 2014-10-15 NOTE — Telephone Encounter (Signed)
Will route to CMA of provider.

## 2014-10-15 NOTE — Telephone Encounter (Signed)
Medication filled to pharmacy as requested.   

## 2014-10-15 NOTE — Assessment & Plan Note (Signed)
Chronic problem.  Currently well controlled.  Asymptomatic.  Check labs.  No anticipated med changes. 

## 2014-10-15 NOTE — Assessment & Plan Note (Signed)
Chronic problem.  Pt is not currently on statin.  Attempting to control w/ healthy diet and regular exercise.  She has lost 6 lbs since last visit.  Applauded her efforts.  Check labs.  Start meds prn.

## 2014-10-15 NOTE — Patient Instructions (Signed)
[facility] and make any changes if needed Keep up the good work on healthy diet and regular exercise- you look great! Call with any questions or concerns Happy Fall!!!

## 2014-10-17 ENCOUNTER — Encounter: Payer: Self-pay | Admitting: General Practice

## 2014-10-17 ENCOUNTER — Other Ambulatory Visit (INDEPENDENT_AMBULATORY_CARE_PROVIDER_SITE_OTHER): Payer: Medicare Other

## 2014-10-17 DIAGNOSIS — D508 Other iron deficiency anemias: Secondary | ICD-10-CM

## 2014-10-17 LAB — FECAL OCCULT BLOOD, IMMUNOCHEMICAL: FECAL OCCULT BLD: NEGATIVE

## 2014-10-22 ENCOUNTER — Telehealth: Payer: Self-pay | Admitting: Family Medicine

## 2014-10-22 NOTE — Telephone Encounter (Signed)
Pt informed and expressed an understanding.  

## 2014-10-22 NOTE — Telephone Encounter (Signed)
Ok to start Mucinex for congestion

## 2014-10-22 NOTE — Telephone Encounter (Signed)
Pt has been taking the allergy medicine since 10/15/14 as advised. She said that she has continued drainage and now it's in her head. She was going to start mucinex but wants to know if this is the appropriate course. Please call [phone].

## 2014-11-10 ENCOUNTER — Encounter: Payer: Self-pay | Admitting: Family Medicine

## 2014-12-02 ENCOUNTER — Encounter: Payer: Self-pay | Admitting: Family Medicine

## 2014-12-02 ENCOUNTER — Ambulatory Visit (INDEPENDENT_AMBULATORY_CARE_PROVIDER_SITE_OTHER): Payer: Medicare Other | Admitting: Family Medicine

## 2014-12-02 VITALS — BP 122/86 | HR 95 | Temp 97.9°F | Resp 16 | Ht 64.0 in | Wt 206.1 lb

## 2014-12-02 DIAGNOSIS — M5431 Sciatica, right side: Secondary | ICD-10-CM

## 2014-12-02 MED ORDER — FLUTICASONE PROPIONATE 50 MCG/ACT NA SUSP: 2.0000 | Freq: Every day | NASAL | Status: DC

## 2014-12-02 MED ORDER — MELOXICAM 7.5 MG PO TABS: 7.5000 mg | ORAL_TABLET | Freq: Every day | ORAL | Status: DC

## 2014-12-02 MED ORDER — PREDNISONE 10 MG PO TABS: ORAL_TABLET | ORAL | Status: DC

## 2014-12-02 MED ORDER — TIZANIDINE HCL 2 MG PO TABS: 2.0000 mg | ORAL_TABLET | Freq: Every day | ORAL | Status: DC

## 2014-12-02 NOTE — Progress Notes (Signed)
Pre visit review using our clinic review tool, if applicable. No additional management support is needed unless otherwise documented below in the visit note. 

## 2014-12-02 NOTE — Patient Instructions (Signed)
Follow up as scheduled Start the prednisone as directed- take w/ food- for inflammation Use the tizanidine prior to bed as needed for muscle spasm- will cause drowsiness After you complete the prednisone taper, take the meloxicam as needed for pain/inflammation We'll call you with your physical therapy appt Call with any questions or concerns Happy Holidays!!!

## 2014-12-02 NOTE — Progress Notes (Signed)
   Subjective:    Patient ID: Samantha Briggs, female    DOB: [birth year], 81 y.o.   MRN: [id number]  HPI R leg pain- pain is anterior lower leg at ankle and radiates up into R hip.  Pain is intermittent, occuring for 'a long time'.  Lower leg pain will wake her from sleep.  No swelling.  No redness.  No warmth.  No pain in L leg.  No known injury.   Review of Systems For ROS see HPI     Objective:   Physical Exam  Constitutional: She is oriented to person, place, and time. She appears well-developed and well-nourished. No distress.  HENT:  Head: Normocephalic and atraumatic.  Cardiovascular: Intact distal pulses.   Musculoskeletal: She exhibits tenderness (TTP over R sciatic notch, no TTP over lower leg). She exhibits no edema (no edema of R foot, ankle, or lower leg).  Neurological: She is alert and oriented to person, place, and time. She has normal reflexes. No cranial nerve deficit. Coordination normal.  Skin: Skin is warm and dry. No erythema.  Psychiatric: She has a normal mood and affect. Her behavior is normal. Thought content normal.  Vitals reviewed.         Assessment & Plan:

## 2014-12-03 ENCOUNTER — Telehealth: Payer: Self-pay | Admitting: Family Medicine

## 2014-12-03 NOTE — Telephone Encounter (Signed)
Agree- pt should stop prednisone.  Allow 2 days off prednisone and then take the mobic as needed for pain

## 2014-12-03 NOTE — Assessment & Plan Note (Signed)
New.  Pt's sxs and PE consistent w/ sciatica.  Start pred taper to improve current inflammation.  Low dose muscle relaxer at night to improve sleep.  Once pred taper is done, she is to use Mobic prn.  Refer to PT for ongoing management.  Reviewed supportive care and red flags that should prompt return.  Pt expressed understanding and is in agreement w/ plan.

## 2014-12-03 NOTE — Telephone Encounter (Signed)
Pt called in today and advised that she was concerned that the prednisone was causing her BP to become elevated. Her readings this morning were 195/88 pulse was 64, this was an hour after taking the prednisone. Pt states that she was concerned so she rechecked her BP this afternoon and BP was 193/83 pulse was 79. Pt declined and headaches, SOB, dizziness. States that she has been watching her sodium level closely and has been increasing her water intake. Pt was advised to take her normal medications tomorrow and to hold the prednisone. Pt will check her BP and call the office. Advised pt to seek ER tonight if symptoms change or worsen.

## 2014-12-04 NOTE — Telephone Encounter (Signed)
Pt advised, stated an understanding.

## 2014-12-04 NOTE — Telephone Encounter (Signed)
Perfect!  Follow advice as provided below (hold pred, Mobic as needed for pain)

## 2014-12-04 NOTE — Telephone Encounter (Signed)
This morning 120/79 & 48 pulse. Please call her about prednisone. Call [phone]

## 2014-12-07 ENCOUNTER — Emergency Department (HOSPITAL_BASED_OUTPATIENT_CLINIC_OR_DEPARTMENT_OTHER)
Admission: EM | Admit: 2014-12-07 | Discharge: 2014-12-07 | Disposition: A | Discharge status: Home / Self Care | Payer: Medicare Other | Attending: Emergency Medicine | Admitting: Emergency Medicine

## 2014-12-07 ENCOUNTER — Encounter (HOSPITAL_BASED_OUTPATIENT_CLINIC_OR_DEPARTMENT_OTHER): Payer: Self-pay | Admitting: *Deleted

## 2014-12-07 DIAGNOSIS — M79604 Pain in right leg: Secondary | ICD-10-CM | POA: Diagnosis present

## 2014-12-07 DIAGNOSIS — F329 Major depressive disorder, single episode, unspecified: Secondary | ICD-10-CM | POA: Insufficient documentation

## 2014-12-07 DIAGNOSIS — M5431 Sciatica, right side: Secondary | ICD-10-CM | POA: Diagnosis not present

## 2014-12-07 DIAGNOSIS — Z862 Personal history of diseases of the blood and blood-forming organs and certain disorders involving the immune mechanism: Secondary | ICD-10-CM | POA: Diagnosis not present

## 2014-12-07 DIAGNOSIS — Z79899 Other long term (current) drug therapy: Secondary | ICD-10-CM | POA: Diagnosis not present

## 2014-12-07 DIAGNOSIS — I10 Essential (primary) hypertension: Secondary | ICD-10-CM | POA: Insufficient documentation

## 2014-12-07 DIAGNOSIS — Z8639 Personal history of other endocrine, nutritional and metabolic disease: Secondary | ICD-10-CM | POA: Insufficient documentation

## 2014-12-07 DIAGNOSIS — Z87891 Personal history of nicotine dependence: Secondary | ICD-10-CM | POA: Insufficient documentation

## 2014-12-07 DIAGNOSIS — Z7982 Long term (current) use of aspirin: Secondary | ICD-10-CM | POA: Insufficient documentation

## 2014-12-07 DIAGNOSIS — H409 Unspecified glaucoma: Secondary | ICD-10-CM | POA: Insufficient documentation

## 2014-12-07 DIAGNOSIS — Z7951 Long term (current) use of inhaled steroids: Secondary | ICD-10-CM | POA: Diagnosis not present

## 2014-12-07 MED ORDER — DOCUSATE SODIUM 100 MG PO CAPS: 100.0000 mg | ORAL_CAPSULE | Freq: Two times a day (BID) | ORAL | Status: DC

## 2014-12-07 MED ORDER — HYDROCODONE-ACETAMINOPHEN 5-325 MG PO TABS: 1.0000 | ORAL_TABLET | ORAL | Status: DC | PRN

## 2014-12-07 NOTE — ED Notes (Signed)
Patient c/o R leg pain that started last Monday & went to primary MD on Wednesday and was palced on prednisone,meloxicam & tizanidine. Prednisone was stopped due to fluid retention

## 2014-12-07 NOTE — Discharge Instructions (Signed)
Sciatica Discontinue meloxicam and prednisone for 2 days. Take Vicodin and tizanidine for pain. Discuss restarting prednisone if her blood pressures have improved. Sciatica is pain, weakness, numbness, or tingling along the path of the sciatic nerve. The nerve starts in the lower back and runs down the back of each leg. The nerve controls the muscles in the lower leg and in the back of the knee, while also providing sensation to the back of the thigh, lower leg, and the sole of your foot. Sciatica is a symptom of another medical condition. For instance, nerve damage or certain conditions, such as a herniated disk or bone spur on the spine, pinch or put pressure on the sciatic nerve. This causes the pain, weakness, or other sensations normally associated with sciatica. Generally, sciatica only affects one side of the body. CAUSES   Herniated or slipped disc.  Degenerative disk disease.  A pain disorder involving the narrow muscle in the buttocks (piriformis syndrome).  Pelvic injury or fracture.  Pregnancy.  Tumor (rare). SYMPTOMS  Symptoms can vary from mild to very severe. The symptoms usually travel from the low back to the buttocks and down the back of the leg. Symptoms can include:  Mild tingling or dull aches in the lower back, leg, or hip.  Numbness in the back of the calf or sole of the foot.  Burning sensations in the lower back, leg, or hip.  Sharp pains in the lower back, leg, or hip.  Leg weakness.  Severe back pain inhibiting movement. These symptoms may get worse with coughing, sneezing, laughing, or prolonged sitting or standing. Also, being overweight may worsen symptoms. DIAGNOSIS  Your caregiver will perform a physical exam to look for common symptoms of sciatica. He or she may ask you to do certain movements or activities that would trigger sciatic nerve pain. Other tests may be performed to find the cause of the sciatica. These may include:  Blood  tests.  X-rays.  Imaging tests, such as an MRI or CT scan. TREATMENT  Treatment is directed at the cause of the sciatic pain. Sometimes, treatment is not necessary and the pain and discomfort goes away on its own. If treatment is needed, your caregiver may suggest:  Over-the-counter medicines to relieve pain.  Prescription medicines, such as anti-inflammatory medicine, muscle relaxants, or narcotics.  Applying heat or ice to the painful area.  Steroid injections to lessen pain, irritation, and inflammation around the nerve.  Reducing activity during periods of pain.  Exercising and stretching to strengthen your abdomen and improve flexibility of your spine. Your caregiver may suggest losing weight if the extra weight makes the back pain worse.  Physical therapy.  Surgery to eliminate what is pressing or pinching the nerve, such as a bone spur or part of a herniated disk. HOME CARE INSTRUCTIONS   Only take over-the-counter or prescription medicines for pain or discomfort as directed by your caregiver.  Apply ice to the affected area for 20 minutes, 3-4 times a day for the first 48-72 hours. Then try heat in the same way.  Exercise, stretch, or perform your usual activities if these do not aggravate your pain.  Attend physical therapy sessions as directed by your caregiver.  Keep all follow-up appointments as directed by your caregiver.  Do not wear high heels or shoes that do not provide proper support.  Check your mattress to see if it is too soft. A firm mattress may lessen your pain and discomfort. SEEK IMMEDIATE MEDICAL CARE IF:  You lose control of your bowel or bladder (incontinence).  You have increasing weakness in the lower back, pelvis, buttocks, or legs.  You have redness or swelling of your back.  You have a burning sensation when you urinate.  You have pain that gets worse when you lie down or awakens you at night.  Your pain is worse than you have  experienced in the past.  Your pain is lasting longer than 4 weeks.  You are suddenly losing weight without reason. MAKE SURE YOU:  Understand these instructions.  Will watch your condition.  Will get help right away if you are not doing well or get worse.   This information is not intended to replace advice given to you by your health care provider. Make sure you discuss any questions you have with your health care provider.   Document Released: 12/14/2000 Document Revised: 09/10/2014 Document Reviewed: 05/01/2011 Elsevier Interactive Patient Education [employer]

## 2014-12-07 NOTE — ED Provider Notes (Addendum)
CSN: [id number]     Arrival date & time 12/07/14  [id number] History   First MD Initiated Contact with Patient 12/07/14 0840     Chief Complaint  Patient presents with  . Leg Pain     (Consider location/radiation/quality/duration/timing/severity/associated sxs/prior Treatment) HPI Patient began developing right leg pain 1 week ago. She noticed it on the outside of her right leg and on the top of her foot. It has a burning and aching quality. She is also periodically had pain that is coming from the center of the buttock down the leg. She reports it does come and go. There is no one specific position that exacerbates it. She can walk without weakness or gait instability. No numbness. No bowel or bladder dysfunction. No associated abdominal pain. Patient had sciatica once several years ago. She saw her primary physician this week and was started on prednisone, meloxicam and tizanidine. Due to her blood pressure having elevated while taking these medications, her physician advised her to discontinue her prednisone. Patient reports her pain is not been adequately controlled with these medications and particularly at night sometimes she is getting episodes of provider her from sleeping. Past Medical History  Diagnosis Date  . Anemia   . Depression   . Glaucoma   . Allergy   . Hypertension   . Hyperlipidemia    Past Surgical History  Procedure Laterality Date  . Cholecystectomy    . Dilation and curettage of uterus    . Retinal detachment surgery    . Corneal transplant      x2   No family history on file. Social History  Substance Use Topics  . Smoking status: Former Games developermoker  . Smokeless tobacco: Never Used  . Alcohol Use: No   OB History    No data available     Review of Systems  10 Systems reviewed and are negative for acute change except as noted in the HPI.   Allergies  Morphine and related and Neurontin  Home Medications   Prior to Admission medications   Medication Sig  Start Date End Date Taking? Authorizing Provider  oxymetazoline (AFRIN) 0.05 % nasal spray Place 1 spray into both nostrils 2 (two) times daily.   Yes Historical Provider, MD  aspirin 81 MG tablet Take 81 mg by mouth daily.      Historical Provider, MD  clonazePAM (KLONOPIN) 0.5 MG tablet Take 1 tablet (0.5 mg total) by mouth 2 (two) times daily as needed. for anxiety 10/15/14   Sheliah HatchKatherine E Tabori, MD  dorzolamide (TRUSOPT) 2 % ophthalmic solution INSTILL ONE DROP INTO LEFT EYE TWICE DAILY 08/29/14   Historical Provider, MD  fluticasone (FLONASE) 50 MCG/ACT nasal spray Place 2 sprays into both nostrils daily. 12/02/14   Sheliah HatchKatherine E Tabori, MD  hydrochlorothiazide (HYDRODIURIL) 12.5 MG tablet Take 1 tablet (12.5 mg total) by mouth daily. 04/10/14   Sheliah HatchKatherine E Tabori, MD  HYDROcodone-acetaminophen (NORCO/VICODIN) 5-325 MG tablet Take 1-2 tablets by mouth every 4 (four) hours as needed for moderate pain or severe pain. 12/07/14   Arby BarretteMarcy Brisa Auth, MD  Iron Combinations (IRON COMPLEX PO) Take 65 mg by mouth daily.    Historical Provider, MD  latanoprost (XALATAN) 0.005 % ophthalmic solution Place 1 drop into the left eye daily.  09/13/10   Historical Provider, MD  levothyroxine (SYNTHROID, LEVOTHROID) 50 MCG tablet TAKE 1 TABLET EVERY DAY 06/09/14   Sheliah HatchKatherine E Tabori, MD  meloxicam (MOBIC) 7.5 MG tablet Take 1 tablet (7.5 mg total) by mouth  daily. 12/02/14   Sheliah Hatch, MD  omeprazole (PRILOSEC) 20 MG capsule TAKE ONE CAPSULE BY MOUTH ONE TIME DAILY 09/09/14   Sheliah Hatch, MD  PRED MILD 0.12 % ophthalmic suspension Place 1 drop into both eyes.  09/19/10   Historical Provider, MD  predniSONE (DELTASONE) 10 MG tablet 3 tabs x3 days and then 2 tabs x3 days and then 1 tab x3 days.  Take w/ food. 12/02/14   Sheliah Hatch, MD  sertraline (ZOLOFT) 50 MG tablet TAKE 1 TABLET (50 MG TOTAL) BY MOUTH DAILY. 08/05/14   Sheliah Hatch, MD  tiZANidine (ZANAFLEX) 2 MG tablet Take 1 tablet (2 mg total) by  mouth at bedtime. 12/02/14   Sheliah Hatch, MD  valsartan (DIOVAN) 320 MG tablet TAKE ONE TABLET BY MOUTH ONE TIME DAILY 08/04/14   Sheliah Hatch, MD   BP 165/75 mmHg  Pulse 93  Temp(Src) 98.2 F (36.8 C) (Oral)  Resp 18  Ht  (1.702 m)  Wt 199 lb (90.266 kg)  BMI 31.16 kg/m2  SpO2 99% Physical Exam  Constitutional: She is oriented to person, place, and time. She appears well-developed and well-nourished.  HENT:  Head: Normocephalic and atraumatic.  Eyes: EOM are normal. Pupils are equal, round, and reactive to light.  Neck: Neck supple.  Cardiovascular: Normal rate, regular rhythm, normal heart sounds and intact distal pulses.   Pulmonary/Chest: Effort normal and breath sounds normal.  Abdominal: Soft. Bowel sounds are normal. She exhibits no distension. There is no tenderness.  Musculoskeletal: Normal range of motion. She exhibits tenderness. She exhibits no edema.  Direct pressure over the sciatic nerve in the buttock reproduced pain. Patient however has normal range of motion and normal gait. There is no edema or erythema of the extremities. Lower extremities in excellent condition. Dorsalis pedis pulses 2+.  Neurological: She is alert and oriented to person, place, and time. She has normal strength. She exhibits normal muscle tone. Coordination normal. GCS eye subscore is 4. GCS verbal subscore is 5. GCS motor subscore is 6.  Patient has normal gait. She has normal strength in flexion and extension of the right lower extremity. No decreased sensation to light touch.  Skin: Skin is warm, dry and intact.  Psychiatric: She has a normal mood and affect.    ED Course  Procedures (including critical care time) Labs Review Labs Reviewed - No data to display  Imaging Review No results found. I have personally reviewed and evaluated these images and lab results as part of my medical decision-making.   EKG Interpretation None      MDM   Final diagnoses:  Sciatica  of right side   Findings are consistent with sciatica. Patient's blood pressure has elevated since initiating the prednisone and meloxicam. At this time I will also have her discontinue the meloxicam as well as a prednisone for a 2 day trial. She'll be given Vicodin for pain and she is to continue the tizanidine. Patient is given prescription for Colace to use 2 avoid constipation with narcotics. Patient is to follow-up with her family doctor in 2 days to reassess blood pressure and determine appropriateness of restarting prednisone at that time. She is otherwise in excellent condition without any intra-abdominal complaints and normal neurologic function.    Arby Barrette, MD 12/07/14 1610  Arby Barrette, MD 12/07/14 [phone]

## 2014-12-10 ENCOUNTER — Ambulatory Visit (INDEPENDENT_AMBULATORY_CARE_PROVIDER_SITE_OTHER): Payer: Medicare Other | Admitting: Family Medicine

## 2014-12-10 ENCOUNTER — Encounter: Payer: Self-pay | Admitting: Family Medicine

## 2014-12-10 VITALS — BP 138/82 | HR 96 | Temp 98.5°F | Resp 16 | Ht 67.0 in | Wt 205.5 lb

## 2014-12-10 DIAGNOSIS — M5431 Sciatica, right side: Secondary | ICD-10-CM | POA: Diagnosis not present

## 2014-12-10 DIAGNOSIS — R197 Diarrhea, unspecified: Secondary | ICD-10-CM | POA: Diagnosis not present

## 2014-12-10 NOTE — Patient Instructions (Signed)
Follow up as needed Continue the Vicodin once daily in the morning and the muscle relaxer at night Ibuprofen as needed for breakthrough pain The diarrhea should improve w/ time- continue to drink fluids and rest Call with any questions or concerns Hang in there!!! Happy Holidays!!!

## 2014-12-10 NOTE — Progress Notes (Signed)
   Subjective:    Patient ID: Samantha Briggs, female    DOB: [birth year], 81 y.o.   MRN: [id number]  HPI ER f/u- pt went to ER 12/4 due to severe pain from sciatica.  Pt admits that she was really worked up and in the ER BP was quite high.  Pt was told to stop Meloxicam, continue the Tizanidine, and start Vicodin prn.  Pt is now having diarrhea- yellow.  Pt reports diarrhea started ~5 days ago.  No improvement over the last few days.  No fevers.  Pt having 4-5 stools daily, loose and w/ mucous.   Review of Systems For ROS see HPI     Objective:   Physical Exam  Constitutional: She is oriented to person, place, and time. She appears well-developed and well-nourished. No distress.  HENT:  Head: Normocephalic and atraumatic.  MMM  Neck: Neck supple.  Cardiovascular: Normal rate, regular rhythm and intact distal pulses.   Pulmonary/Chest: Effort normal and breath sounds normal. No respiratory distress. She has no wheezes. She has no rales.  Abdominal: Soft. She exhibits no distension. There is no tenderness. There is no rebound.  Hyperactive BS  Lymphadenopathy:    She has no cervical adenopathy.  Neurological: She is alert and oriented to person, place, and time.  Skin: Skin is warm and dry.  Vitals reviewed.         Assessment & Plan:

## 2014-12-10 NOTE — Assessment & Plan Note (Signed)
Recurrent problem for pt.  Suspect this is viral in nature and not related to pt's medication changes.  Stools are loose but not watery, occuring ~4x/day.  Encouraged increased fluids, BRAT diet.  Reviewed supportive care and red flags that should prompt return.  Pt expressed understanding and is in agreement w/ plan.

## 2014-12-10 NOTE — Assessment & Plan Note (Signed)
Ongoing issue for pt.  She was unable to take the Prednisone and the Mobic due to elevated BP.  She is able to take OTC ibuprofen w/o BP elevation.  Pt is to start PT next week.  Continue Vicodin in the AM as this improves her pain for most of the day.  Reviewed supportive care and red flags that should prompt return.  Pt expressed understanding and is in agreement w/ plan.

## 2014-12-10 NOTE — Progress Notes (Signed)
Pre visit review using our clinic review tool, if applicable. No additional management support is needed unless otherwise documented below in the visit note. 

## 2014-12-15 ENCOUNTER — Ambulatory Visit: Payer: Medicare Other | Admitting: Rehabilitation

## 2014-12-23 ENCOUNTER — Ambulatory Visit: Payer: Medicare Other | Attending: Family Medicine | Admitting: Physical Therapy

## 2014-12-23 DIAGNOSIS — R262 Difficulty in walking, not elsewhere classified: Secondary | ICD-10-CM | POA: Diagnosis not present

## 2014-12-23 DIAGNOSIS — R29898 Other symptoms and signs involving the musculoskeletal system: Secondary | ICD-10-CM | POA: Diagnosis not present

## 2014-12-23 DIAGNOSIS — M79604 Pain in right leg: Secondary | ICD-10-CM | POA: Diagnosis not present

## 2014-12-23 DIAGNOSIS — M5431 Sciatica, right side: Secondary | ICD-10-CM | POA: Diagnosis not present

## 2014-12-23 NOTE — Therapy (Addendum)
[facility] [address]  [place], [place], [address] Phone: [phone]   Fax:  [phone]  Physical Therapy Evaluation  Patient Details  Name: Samantha Briggs MRN: [id number] Date of Birth: February 22, 1932 Referring Provider: Midge Minium, MD  Encounter Date: 12/23/2014      PT End of Session - 12/23/14 0939    Visit Number 1   Number of Visits 12   Date for PT Re-Evaluation 02/03/15   PT Start Time 0845   PT Stop Time 0935   PT Time Calculation (min) 50 min   Activity Tolerance Patient tolerated treatment well   Behavior During Therapy [facility] for tasks assessed/performed      Past Medical History  Diagnosis Date  . Anemia   . Depression   . Glaucoma   . Allergy   . Hypertension   . Hyperlipidemia     Past Surgical History  Procedure Laterality Date  . Cholecystectomy    . Dilation and curettage of uterus    . Retinal detachment surgery    . Corneal transplant      x2    There were no vitals filed for this visit.  Visit Diagnosis:  Sciatica of right side  Right leg pain  Difficulty walking  Weakness of right hip      Subjective Assessment - 12/23/14 0847    Subjective Patient reports having one previous epiosde of sciatica years ago that resolved on its own. One month ago, was spending a lot of time palying with grandkids on the floor when started to notice pain in right buttock and down her right leg to her ankle. MD started her on medication but has not noticed any benefit. Pain disrupts sleep and limits walking.   How long can you stand comfortably? < 1 hr   How long can you walk comfortably? Pain present in varying degree at all times while walking   Diagnostic tests none   Patient Stated Goals "Just to relieve the discomfort"   Currently in Pain? Yes   Pain Score 5    Pain Location Buttocks   Pain Orientation Right   Pain Descriptors / Indicators Aching;Cramping   Pain  Radiating Towards Radiates down to ankle   Pain Onset 1 to 4 weeks ago   Pain Frequency Intermittent   Aggravating Factors  Sleeping, static position for long period   Pain Relieving Factors Elevating leg   Effect of Pain on Daily Activities Disrupts sleep, limits walking, interferes with household chores (esp vacuuming)            [facility] PT Assessment - 12/23/14 0845    Assessment   Medical Diagnosis Right sided scaitica   Referring Provider Midge Minium, MD   Onset Date/Surgical Date --  ~1 month ago   Next MD Visit as needed for referring diagnosis (04/16/15 otherwise)   Prior Therapy none   Precautions   Precaution Comments No lifting >25# due to corneal replacement   Balance Screen   Has the patient fallen in the past 6 months No   Has the patient had a decrease in activity level because of a fear of falling?  No   Is the patient reluctant to leave their home because of a fear of falling?  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[employer], volunteer work, play cards, family time, shopping   Observation/Other Assessments   Focus on Therapeutic Outcomes (FOTO)  51% (49% limitation); Predicted 63% (37% limitation)   ROM / Strength   AROM / PROM / Strength Strength   Strength   Strength Assessment Site Hip;Knee;Ankle   Right/Left Hip Right;Left   Right Hip Flexion 3+/5   Right Hip Extension 3+/5   Right Hip ABduction 3+/5   Right Hip ADduction 3+/5   Left Hip Flexion 4-/5   Left Hip Extension 4-/5   Left Hip ABduction 4-/5   Left Hip ADduction 4-/5   Right/Left Knee Right;Left   Right Knee Flexion 5/5   Right Knee Extension 4+/5   Left Knee Flexion 4+/5   Left Knee Extension 4+/5   Right/Left Ankle Right;Left   Right Ankle Dorsiflexion 4/5    Right Ankle Plantar Flexion 4/5   Left Ankle Dorsiflexion 4/5   Left Ankle Plantar Flexion 4/5   Flexibility   Soft Tissue Assessment /Muscle Length yes   Hamstrings mildly tight on right   ITB [facility]   Piriformis tight bilaterally   Special Tests    Special Tests Hip Special Tests   Hip Special Tests  Saralyn Pilar (FABER) Test;Thomas Test;Ober's Test;Piriformis Test   Saralyn Pilar [facility]) Test   Findings Positive   Side Right;Left   Thomas Test    Findings Positive   Side Right;Left   Ober's Test   Findings Negative   Piriformis Test   Findings Positive   Side  Right        Today's Treatment  TherEx Initial HEP instruction:   R Sciatic nerve glide x10   R Hamstring stretch 3x20" (instruction to perform all stretches bilaterally)   R Piriformis stretch 3x20"   Abdominal bracing 10x5"   Bridges 10x5"  R modified thomas stretch for hip flexors attempted but deferred d/t pain           PT Education - 12/23/14 1300    Education provided Yes   Education Details PT eval findings, POC & initial HEP   Person(s) Educated Patient   Methods Explanation;Demonstration;Handout   Comprehension Verbalized understanding;Returned demonstration;Need further instruction          PT Short Term Goals - 12/23/14 1314    PT SHORT TERM GOAL #1   Title Independent with initial HEP (01/13/15)           PT Long Term Goals - 12/23/14 1315    PT LONG TERM GOAL #1   Title Independent with advanced HEP/gym program as indicated (02/03/15)   Time 6   Period Weeks   Status New   PT LONG TERM GOAL #2   Title Patient will report no sleep disturbance related to right LE pain (02/03/15)   Time 6   Period Weeks   Status New   PT LONG TERM GOAL #3   Title Patient will demonstrate bilateral hip strength 4/5 or greater (02/03/15)   Time 6   Period Weeks   Status New   PT LONG TERM GOAL #4   Title Patient will report ability to walk 30 minutes or greater without right LE pain (02/03/15)   Time  6   Period Weeks   Status New               Plan - 12/23/14 1300    Clinical Impression Statement Patient is a  81 y/o female who presents to OP PT with cc of right buttock pain with right sided sciatica with radicular pain most pronounced in right ankle. Assessment reveals overall LE ROM WFL but some musculature tightness predominantly in right proximal LE musculature, especially hamstrings, piriformis and hip flexors. Right hip also weaker than left with bilateral knee and ankle strength grossly WFL and symmetrical. Pain disrupts sleep at night and limts standing and walking tolerance. Distal LE pain especially at ankle appears consistent with radicular sciatic pain, therefore will address proximal issues first, and if ankle pain persists then reassess ankle.   Pt will benefit from skilled therapeutic intervention in order to improve on the following deficits Pain;Impaired flexibility;Decreased strength;Difficulty walking;Decreased activity tolerance   Rehab Potential Good   PT Frequency 2x / week   PT Duration 6 weeks   PT Treatment/Interventions Therapeutic exercise;Manual techniques;Therapeutic activities;Passive range of motion;Taping;Ultrasound;Moist Heat;Electrical Stimulation;Iontophoresis 98m/ml Dexamethasone;Cryotherapy;Traction;Functional mobility training;Gait training;Balance training;Neuromuscular re-education;Patient/family education   PT Next Visit Plan Review initial HEP, Assess lumbar spine and check for SIJ involvement, Core/lumbar strengthening/stabilization, LE strengthening   Consulted and Agree with Plan of Care Patient          G-Codes - [id number]    Functional Assessment Tool Used FOTO = 51% (49% limitation)   Functional Limitation Mobility: Walking and moving around   Mobility: Walking and Moving Around Current Status ((H4174 At least 40 percent but less than 60 percent impaired, limited or restricted   Mobility: Walking and Moving Around Goal Status  ((Y8144 At least 20 percent but less than 40 percent impaired, limited or restricted  Predicted FOTO = 63% (37% limitation)       Problem List Patient Active Problem List   Diagnosis Date Noted  . Diarrhea 12/10/2014  . Sciatica of right side 12/02/2014  . Sinusitis, acute maxillary 09/05/2014  . Viral gastroenteritis 02/18/2014  . Anemia, iron deficiency 11/19/2013  . IBS (irritable bowel syndrome) 06/17/2013  . Lumbar back pain 06/17/2013  . GERD (gastroesophageal reflux disease) 06/17/2013  . Hypertriglyceridemia 03/13/2013  . Routine general medical examination at a health care facility 09/19/2012  . Plantar fasciitis 08/17/2012  . Gout 08/17/2012  . Obesity 07/26/2011  . Caregiver stress 07/26/2011  . Epigastric abdominal pain 06/21/2011  . Bloating 06/12/2011  . Post herpetic neuralgia 06/02/2011  . Insomnia 05/18/2011  . Fatigue 12/06/2010  . Dry skin 12/06/2010  . Leg pain 12/06/2010  . HTN (hypertension) 10/17/2010  . Depression with anxiety 10/17/2010  . Glaucoma 10/17/2010    JPercival Spanish PT, MPT 101-13-17 7:07 PM  [facility] [place] [address]Phone: [phone]  Fax:  [phone] Name: Samantha JENNINGSMRN: [id number] of Birth: 126-Sep-1934  PHYSICAL THERAPY DISCHARGE SUMMARY  Visits from Start of Care: 1  Current functional level related to goals / functional outcomes:  Refer to above eval as patient cancelled all future visits due to financial concerns.    Remaining deficits:  Unable to assess secondary failure to return for additional visits after initial eval.   Education / Equipment:   Initial HEP   Plan: Patient agrees to discharge.  Patient goals were not met. Patient is being discharged due to financial reasons.  ?????       JPercival Spanish PT, MPT 01/08/2015, 2:45 PM  [facility] [address] [place] Phone: [phone]   Fax:  [phone]

## 2014-12-24 ENCOUNTER — Telehealth: Payer: Self-pay | Admitting: Family Medicine

## 2014-12-24 MED ORDER — HYDROCODONE-ACETAMINOPHEN 5-325 MG PO TABS: 1.0000 | ORAL_TABLET | ORAL | Status: DC | PRN

## 2014-12-24 NOTE — Telephone Encounter (Signed)
Ok for #30 

## 2014-12-24 NOTE — Telephone Encounter (Signed)
Relation to WU:JWJXpt:self Call back [phone]   Reason for call:  Patient requesting a refill HYDROcodone-acetaminophen (NORCO/VICODIN) 5-325 MG tablet

## 2014-12-24 NOTE — Telephone Encounter (Signed)
Last OV 12-10-14 Hydrocodone last filled 12/07/14 #20 with 0, by ER

## 2014-12-24 NOTE — Telephone Encounter (Signed)
Med filled will inform pt available at front desk for pick up.

## 2014-12-26 ENCOUNTER — Other Ambulatory Visit: Payer: Self-pay | Admitting: Family Medicine

## 2014-12-26 NOTE — Telephone Encounter (Signed)
Medication filled to pharmacy as requested.   

## 2014-12-30 ENCOUNTER — Ambulatory Visit: Payer: Medicare Other

## 2015-01-01 ENCOUNTER — Ambulatory Visit: Payer: Medicare Other

## 2015-01-06 ENCOUNTER — Ambulatory Visit: Payer: Medicare Other

## 2015-01-08 ENCOUNTER — Ambulatory Visit: Payer: Medicare Other | Admitting: Physical Therapy

## 2015-01-13 ENCOUNTER — Ambulatory Visit: Payer: Medicare Other | Admitting: Physical Therapy

## 2015-01-15 ENCOUNTER — Ambulatory Visit: Payer: Medicare Other

## 2015-01-20 ENCOUNTER — Ambulatory Visit: Payer: Medicare Other

## 2015-01-22 ENCOUNTER — Ambulatory Visit: Payer: Medicare Other | Admitting: Physical Therapy

## 2015-01-27 ENCOUNTER — Ambulatory Visit: Payer: Medicare Other | Admitting: Physical Therapy

## 2015-01-29 ENCOUNTER — Ambulatory Visit: Payer: Medicare Other | Admitting: Physical Therapy

## 2015-02-03 ENCOUNTER — Other Ambulatory Visit: Payer: Self-pay | Admitting: Family Medicine

## 2015-02-03 NOTE — Telephone Encounter (Signed)
Medication filled to pharmacy as requested.   

## 2015-03-20 ENCOUNTER — Other Ambulatory Visit: Payer: Self-pay | Admitting: Family Medicine

## 2015-03-20 NOTE — Telephone Encounter (Signed)
Medication filled to pharmacy as requested.   

## 2015-03-26 ENCOUNTER — Other Ambulatory Visit: Payer: Self-pay | Admitting: Family Medicine

## 2015-03-26 NOTE — Telephone Encounter (Signed)
Medication filled to pharmacy as requested.   

## 2015-04-01 DIAGNOSIS — H401123 Primary open-angle glaucoma, left eye, severe stage: Secondary | ICD-10-CM | POA: Diagnosis not present

## 2015-04-16 ENCOUNTER — Encounter: Payer: Medicare Other | Admitting: Family Medicine

## 2015-04-21 ENCOUNTER — Encounter: Payer: Self-pay | Admitting: Family Medicine

## 2015-04-21 ENCOUNTER — Ambulatory Visit (INDEPENDENT_AMBULATORY_CARE_PROVIDER_SITE_OTHER): Payer: Medicare Other | Admitting: Family Medicine

## 2015-04-21 VITALS — BP 140/84 | HR 68 | Temp 97.9°F | Resp 16 | Ht 67.0 in | Wt 207.0 lb

## 2015-04-21 DIAGNOSIS — I10 Essential (primary) hypertension: Secondary | ICD-10-CM | POA: Diagnosis not present

## 2015-04-21 DIAGNOSIS — E038 Other specified hypothyroidism: Secondary | ICD-10-CM | POA: Diagnosis not present

## 2015-04-21 DIAGNOSIS — Z0000 Encounter for general adult medical examination without abnormal findings: Secondary | ICD-10-CM | POA: Diagnosis not present

## 2015-04-21 DIAGNOSIS — E039 Hypothyroidism, unspecified: Secondary | ICD-10-CM | POA: Insufficient documentation

## 2015-04-21 DIAGNOSIS — E781 Pure hyperglyceridemia: Secondary | ICD-10-CM

## 2015-04-21 LAB — BASIC METABOLIC PANEL
BUN: 14 mg/dL (ref 6–23)
CALCIUM: 9 mg/dL (ref 8.4–10.5)
CO2: 31 mEq/L (ref 19–32)
CREATININE: 0.74 mg/dL (ref 0.40–1.20)
Chloride: 104 mEq/L (ref 96–112)
GFR: 80.2 mL/min (ref >60.00)
GLUCOSE: 95 mg/dL (ref 70–99)
Potassium: 3.7 mEq/L (ref 3.5–5.1)
SODIUM: 141 meq/L (ref 135–145)

## 2015-04-21 LAB — CBC WITH DIFFERENTIAL/PLATELET
BASOS ABS: 0 10*3/uL (ref 0.0–0.1)
Basophils Relative: 0.4 % (ref 0.0–3.0)
EOS ABS: 0.1 10*3/uL (ref 0.0–0.7)
Eosinophils Relative: 1.4 % (ref 0.0–5.0)
HEMATOCRIT: 35.8 % — AB (ref 36.0–46.0)
HEMOGLOBIN: 11.4 g/dL — AB (ref 12.0–15.0)
LYMPHS PCT: 25.5 % (ref 12.0–46.0)
Lymphs Abs: 1.4 10*3/uL (ref 0.7–4.0)
MCHC: 31.9 g/dL (ref 30.0–36.0)
MCV: 78.4 fl (ref 78.0–100.0)
MONOS PCT: 7 % (ref 3.0–12.0)
Monocytes Absolute: 0.4 10*3/uL (ref 0.1–1.0)
NEUTROS ABS: 3.6 10*3/uL (ref 1.4–7.7)
Neutrophils Relative %: 65.7 % (ref 43.0–77.0)
Platelets: 205 10*3/uL (ref 150.0–400.0)
RBC: 4.57 Mil/uL (ref 3.87–5.11)
RDW: 18 % — ABNORMAL HIGH (ref 11.5–15.5)
WBC: 5.5 10*3/uL (ref 4.0–10.5)

## 2015-04-21 LAB — HEPATIC FUNCTION PANEL
ALBUMIN: 3.9 g/dL (ref 3.5–5.2)
ALT: 14 U/L (ref 0–35)
AST: 16 U/L (ref 0–37)
Alkaline Phosphatase: 63 U/L (ref 39–117)
BILIRUBIN DIRECT: 0 mg/dL (ref 0.0–0.3)
TOTAL PROTEIN: 6.9 g/dL (ref 6.0–8.3)
Total Bilirubin: 0.3 mg/dL (ref 0.2–1.2)

## 2015-04-21 LAB — LIPID PANEL
Cholesterol: 201 mg/dL — ABNORMAL HIGH (ref 0–200)
HDL: 35.5 mg/dL — AB (ref >39.00)
NonHDL: 165.65
TRIGLYCERIDES: 229 mg/dL — AB (ref 0.0–149.0)
Total CHOL/HDL Ratio: 6
VLDL: 45.8 mg/dL — AB (ref 0.0–40.0)

## 2015-04-21 LAB — TSH: TSH: 3.87 u[IU]/mL (ref 0.35–4.50)

## 2015-04-21 LAB — LDL CHOLESTEROL, DIRECT: Direct LDL: 146 mg/dL

## 2015-04-21 MED ORDER — CLONAZEPAM 0.5 MG PO TABS: 0.5000 mg | ORAL_TABLET | Freq: Two times a day (BID) | ORAL | Status: DC | PRN

## 2015-04-21 NOTE — Assessment & Plan Note (Signed)
Chronic problem.  Not currently on meds.  Attempting to control w/ diet and exercise.  Check labs.  Start meds if needed

## 2015-04-21 NOTE — Assessment & Plan Note (Signed)
Pt's PE unchanged from previous.  UTD on colonoscopy- will not need another.  Pt declines mammo, DEXA.  No need for pap.  UTD on immunizations.  Written screening schedule updated and given to pt.  Check labs.  Anticipatory guidance provided.

## 2015-04-21 NOTE — Patient Instructions (Signed)
Follow up in 6 months to recheck BP and cholesterol We'll notify you of your lab results and make any changes if needed You are up to date on your immunizations- yay! Continue to work on healthy diet and regular exercise Call with any questions or concerns Thanks for sticking with us! Happy Spring!!

## 2015-04-21 NOTE — Progress Notes (Signed)
Pre visit review using our clinic review tool, if applicable. No additional management support is needed unless otherwise documented below in the visit note. 

## 2015-04-21 NOTE — Progress Notes (Signed)
   Subjective:    Patient ID: Samantha Briggs, female    DOB: 1932-11-30, 82 y.o.   MRN: [id number]  HPI Here today for CPE.  Risk Factors: HTN- chronic problem, on HCTZ and Valsartan daily w/ adequate control Hyperlipidemia- chronic problem, plan was to work on healthy diet and regular exercise.  Not currently on meds. Hypothyroid- chronic problem, on synthroid.  Physical Activity: walking regularly Depression: denies current symptoms- eating well, sleeping well Hearing: normal to conversational tones, mildly decreased to whispered voice even w/ hearing aides ADL's: independent Cognitive: normal linear thought process, memory and attention intact  Home Safety: safe at home, lives alone.  Good family support Height, Weight, BMI, Visual Acuity: see vitals, vision corrected to 20/20 w/ glasses Counseling: UTD on colonoscopy- she does not require another.  Declines mammo.  No need for paps.  UTD on immunizations. Care team reviewed and updated w/ pt Labs Ordered: See A&P Care Plan: See A&P    Review of Systems Patient reports no vision/ hearing changes, adenopathy,fever, weight change,  persistant/recurrent hoarseness , swallowing issues, chest pain, palpitations, edema, persistant/recurrent cough, hemoptysis, dyspnea (rest/exertional/paroxysmal nocturnal), gastrointestinal bleeding (melena, rectal bleeding), abdominal pain, significant heartburn, bowel changes, GU symptoms (dysuria, hematuria, incontinence), Gyn symptoms (abnormal  bleeding, pain),  syncope, focal weakness, memory loss, numbness & tingling, skin/hair/nail changes, abnormal bruising or bleeding, anxiety, or depression.     Objective:   Physical Exam General Appearance:    Alert, cooperative, no distress, appears stated age  Head:    Normocephalic, without obvious abnormality, atraumatic  Eyes:    PERRL, conjunctiva/corneas clear, EOM's intact, fundi    benign, both eyes  Ears:    Normal TM's and external ear  canals, both ears  Nose:   Nares normal, septum midline, mucosa normal, no drainage    or sinus tenderness  Throat:   Lips, mucosa, and tongue normal; teeth and gums normal  Neck:   Supple, symmetrical, trachea midline, no adenopathy;    Thyroid: no enlargement/tenderness/nodules  Back:     Symmetric, no curvature, ROM normal, no CVA tenderness  Lungs:     Clear to auscultation bilaterally, respirations unlabored  Chest Wall:    No tenderness or deformity   Heart:    Regular rate and rhythm, S1 and S2 normal, no murmur, rub   or gallop  Breast Exam:    Deferred at pt's request  Abdomen:     Soft, non-tender, bowel sounds active all four quadrants,    no masses, no organomegaly  Genitalia:    Deferred at pt's request  Rectal:    Extremities:   Extremities normal, atraumatic, no cyanosis or edema  Pulses:   2+ and symmetric all extremities  Skin:   Skin color, texture, turgor normal, no rashes or lesions  Lymph nodes:   Cervical, supraclavicular, and axillary nodes normal  Neurologic:   CNII-XII intact, normal strength, sensation and reflexes    throughout         Assessment & Plan:

## 2015-04-21 NOTE — Assessment & Plan Note (Signed)
Chronic problem, on Levothyroxine.  Currently asymptomatic.  Check labs.  Adjust meds prn  

## 2015-04-21 NOTE — Assessment & Plan Note (Signed)
Chronic problem.  BP is mildly elevated for pt but she is very excited today.  Asymptomatic.  Check labs.  No anticipated med changes.  Will continue to follow.

## 2015-04-22 ENCOUNTER — Encounter: Payer: Self-pay | Admitting: General Practice

## 2015-04-28 ENCOUNTER — Telehealth: Payer: Self-pay | Admitting: Family Medicine

## 2015-04-28 NOTE — Telephone Encounter (Signed)
Pt informed that she was given the rx when she left the office, pt found it and will take to the pharmacy today.

## 2015-04-28 NOTE — Telephone Encounter (Signed)
Pt states that she needs refill on clonazepam, [facility] on [address]

## 2015-05-18 DIAGNOSIS — H1131 Conjunctival hemorrhage, right eye: Secondary | ICD-10-CM | POA: Diagnosis not present

## 2015-06-12 ENCOUNTER — Other Ambulatory Visit: Payer: Self-pay | Admitting: General Practice

## 2015-06-12 MED ORDER — CLONAZEPAM 0.5 MG PO TABS: 0.5000 mg | ORAL_TABLET | Freq: Two times a day (BID) | ORAL | Status: DC | PRN

## 2015-06-12 NOTE — Telephone Encounter (Signed)
Last OV 04/21/15  Clonazepam last filled 04/21/15 #60 with 1

## 2015-06-12 NOTE — Telephone Encounter (Signed)
Medication filled to pharmacy as requested.   

## 2015-06-15 ENCOUNTER — Other Ambulatory Visit: Payer: Self-pay | Admitting: General Practice

## 2015-06-15 MED ORDER — LEVOTHYROXINE SODIUM 50 MCG PO TABS: 50.0000 ug | ORAL_TABLET | Freq: Every day | ORAL | Status: DC

## 2015-08-04 ENCOUNTER — Other Ambulatory Visit: Payer: Self-pay | Admitting: Family Medicine

## 2015-09-03 ENCOUNTER — Other Ambulatory Visit: Payer: Self-pay | Admitting: Family Medicine

## 2015-09-08 ENCOUNTER — Encounter: Payer: Self-pay | Admitting: Family Medicine

## 2015-09-08 ENCOUNTER — Ambulatory Visit (INDEPENDENT_AMBULATORY_CARE_PROVIDER_SITE_OTHER): Payer: Medicare Other | Admitting: Family Medicine

## 2015-09-08 VITALS — BP 122/70 | HR 68 | Temp 98.1°F | Resp 16 | Ht 67.0 in | Wt 197.1 lb

## 2015-09-08 DIAGNOSIS — J0100 Acute maxillary sinusitis, unspecified: Secondary | ICD-10-CM

## 2015-09-08 MED ORDER — AMOXICILLIN 875 MG PO TABS: 875.0000 mg | ORAL_TABLET | Freq: Two times a day (BID) | ORAL | 0 refills | Status: DC

## 2015-09-08 NOTE — Progress Notes (Signed)
Pre visit review using our clinic review tool, if applicable. No additional management support is needed unless otherwise documented below in the visit note. 

## 2015-09-08 NOTE — Progress Notes (Signed)
   Subjective:    Patient ID: Samantha Briggs, female    DOB: 1932/10/29, 82 y.o.   MRN: [id number]  HPI Sinus pain- sxs started as 'a cold'.  Now w/ facial pain, cough, watery eyes, neck soreness, + HA.  No fevers.  + travel recently.  + tooth pain.  No fevers.  sxs started ~2 weeks ago.     Review of Systems For ROS see HPI     Objective:   Physical Exam  Constitutional: She appears well-developed and well-nourished. No distress.  HENT:  Head: Normocephalic and atraumatic.  Right Ear: Tympanic membrane normal.  Left Ear: Tympanic membrane normal.  Nose: Mucosal edema and rhinorrhea present. Right sinus exhibits maxillary sinus tenderness and frontal sinus tenderness. Left sinus exhibits maxillary sinus tenderness and frontal sinus tenderness.  Mouth/Throat: Uvula is midline and mucous membranes are normal. Posterior oropharyngeal erythema present. No oropharyngeal exudate.  Eyes: Conjunctivae and EOM are normal. Pupils are equal, round, and reactive to light.  Neck: Normal range of motion. Neck supple.  Cardiovascular: Normal rate, regular rhythm and normal heart sounds.   Pulmonary/Chest: Effort normal and breath sounds normal. No respiratory distress. She has no wheezes.  Lymphadenopathy:    She has no cervical adenopathy.  Vitals reviewed.         Assessment & Plan:

## 2015-09-08 NOTE — Assessment & Plan Note (Signed)
Pt's sxs and PE consistent w/ infxn.  Start abx.  Restart Zyrtec.  Reviewed supportive care and red flags that should prompt return.  Pt expressed understanding and is in agreement w/ plan.

## 2015-09-08 NOTE — Patient Instructions (Signed)
Follow up as needed/scheduled Start the Amoxicillin twice daily- take w/ food Restart the Zyrtec daily for allergies (start around the 4th of July and take until the first frost) Drink plenty of fluids Keep up the good work on healthy diet and regular exercise- you look great! Call with any questions or concerns Happy Fall!!!

## 2015-09-19 ENCOUNTER — Other Ambulatory Visit: Payer: Self-pay | Admitting: Family Medicine

## 2015-10-08 ENCOUNTER — Ambulatory Visit: Payer: Medicare Other | Admitting: Family Medicine

## 2015-10-13 ENCOUNTER — Other Ambulatory Visit: Payer: Self-pay | Admitting: Family Medicine

## 2015-10-14 NOTE — Telephone Encounter (Signed)
Medication filled to pharmacy as requested.   

## 2015-10-14 NOTE — Telephone Encounter (Signed)
Last OV 09/08/15 Clonazepam last filled 06/12/15 #60 with 1

## 2015-10-21 DIAGNOSIS — Z23 Encounter for immunization: Secondary | ICD-10-CM | POA: Diagnosis not present

## 2015-11-24 ENCOUNTER — Other Ambulatory Visit: Payer: Self-pay | Admitting: Family Medicine

## 2015-12-03 DIAGNOSIS — H401123 Primary open-angle glaucoma, left eye, severe stage: Secondary | ICD-10-CM | POA: Diagnosis not present

## 2015-12-03 DIAGNOSIS — Z0100 Encounter for examination of eyes and vision without abnormal findings: Secondary | ICD-10-CM | POA: Diagnosis not present

## 2015-12-03 DIAGNOSIS — Z961 Presence of intraocular lens: Secondary | ICD-10-CM | POA: Diagnosis not present

## 2016-01-05 ENCOUNTER — Ambulatory Visit (INDEPENDENT_AMBULATORY_CARE_PROVIDER_SITE_OTHER): Payer: Medicare Other | Admitting: Physician Assistant

## 2016-01-05 ENCOUNTER — Encounter: Payer: Self-pay | Admitting: Physician Assistant

## 2016-01-05 VITALS — BP 118/70 | HR 67 | Temp 97.7°F | Resp 16 | Ht 67.0 in | Wt 205.0 lb

## 2016-01-05 DIAGNOSIS — J0110 Acute frontal sinusitis, unspecified: Secondary | ICD-10-CM | POA: Diagnosis not present

## 2016-01-05 MED ORDER — FLUTICASONE PROPIONATE 50 MCG/ACT NA SUSP: 2.0000 | Freq: Every day | NASAL | 6 refills | Status: DC

## 2016-01-05 MED ORDER — DOXYCYCLINE HYCLATE 100 MG PO CAPS: 100.0000 mg | ORAL_CAPSULE | Freq: Two times a day (BID) | ORAL | 0 refills | Status: DC

## 2016-01-05 NOTE — Progress Notes (Signed)
Pre visit review using our clinic review tool, if applicable. No additional management support is needed unless otherwise documented below in the visit note. 

## 2016-01-05 NOTE — Progress Notes (Signed)
Patient presents to clinic today c/o 5 days of progressively worsening sinus pressure, bilateral ear pressure/pain with scratchy throat. Notes significant PND with clear mucous. Did not some blood with blowing her nose this morning. Denies recent travel. Endorses some sick contacts. Has history of allergies but denies asthma or COPD. Is not a smoker. Has taken Coricidin HBP for symptoms relief. Has also continued her chronic medications including her Zyrtec.   Past Medical History:  Diagnosis Date  . Allergy   . Anemia   . Depression   . Glaucoma   . Hyperlipidemia   . Hypertension     Current Outpatient Prescriptions on File Prior to Visit  Medication Sig Dispense Refill  . aspirin 81 MG tablet Take 81 mg by mouth daily.      . cetirizine (ZYRTEC) 10 MG tablet Take 10 mg by mouth daily.    . clonazePAM (KLONOPIN) 0.5 MG tablet TAKE 1 TABLET BY MOUTH TWICE DAILY AS NEEDED. 60 tablet 1  . dorzolamide (TRUSOPT) 2 % ophthalmic solution INSTILL ONE DROP INTO LEFT EYE TWICE DAILY  11  . hydrochlorothiazide (HYDRODIURIL) 12.5 MG tablet TAKE 1 TABLET (12.5 MG TOTAL) BY MOUTH DAILY. 90 tablet 1  . Iron Combinations (IRON COMPLEX PO) Take 65 mg by mouth daily.    Marland Kitchen. latanoprost (XALATAN) 0.005 % ophthalmic solution Place 1 drop into the left eye daily.     Marland Kitchen. levothyroxine (SYNTHROID, LEVOTHROID) 50 MCG tablet TAKE 1 TABLET (50 MCG TOTAL) BY MOUTH DAILY. 30 tablet 5  . omeprazole (PRILOSEC) 20 MG capsule TAKE ONE CAPSULE BY MOUTH ONE TIME DAILY 30 capsule 5  . PRED MILD 0.12 % ophthalmic suspension Place 1 drop into both eyes.     . Probiotic Product (PROBIOTIC DAILY) CAPS Take by mouth.    . sertraline (ZOLOFT) 50 MG tablet TAKE 1 TABLET BY MOUTH DAILY. 90 tablet 1  . valsartan (DIOVAN) 320 MG tablet TAKE ONE TABLET BY MOUTH ONE TIME DAILY 90 tablet 1   No current facility-administered medications on file prior to visit.     Allergies  Allergen Reactions  . Morphine And Related Hives  .  Neurontin [Gabapentin]     hives    History reviewed. No pertinent family history.  Social History   Social History  . Marital status: Married    Spouse name: N/A  . Number of children: N/A  . Years of education: N/A   Social History Main Topics  . Smoking status: Former Games developermoker  . Smokeless tobacco: Never Used  . Alcohol use No  . Drug use: Unknown  . Sexual activity: Not Asked   Other Topics Concern  . None   Social History Narrative  . None   Review of Systems - See HPI.  All other ROS are negative.  BP 118/70   Pulse 67   Temp 97.7 F (36.5 C) (Oral)   Resp 16   Ht 5\' 7"  (1.702 m)   Wt 205 lb (93 kg)   SpO2 98%   BMI 32.11 kg/m   Physical Exam  Constitutional: She is oriented to person, place, and time and well-developed, well-nourished, and in no distress.  HENT:  Head: Normocephalic and atraumatic.  Right Ear: Tympanic membrane normal.  Left Ear: Tympanic membrane normal.  Nose: Mucosal edema present.  Mouth/Throat: Uvula is midline, oropharynx is clear and moist and mucous membranes are normal.  Very mild TTP of frontal sinuses bilaterally.  Eyes: Conjunctivae are normal.  Neck: Neck supple.  Cardiovascular: Normal rate, regular rhythm, normal heart sounds and intact distal pulses.   Pulmonary/Chest: Effort normal and breath sounds normal. No respiratory distress. She has no wheezes. She has no rales. She exhibits no tenderness.  Neurological: She is alert and oriented to person, place, and time.  Skin: Skin is warm and dry. No rash noted.  Psychiatric: Affect normal.  Vitals reviewed.  Assessment/Plan: 1. Acute non-recurrent frontal sinusitis Discussed most likely viral although at 4 days of symptoms with some worsening. Will start supportive measures and OTC medications. Begin Flonase and saline spray. Continue Zyrtec and Coricidin. Rx Doxycycline printed for her to fill and start if no improvement within 48 hours.     Piedad Climes,  PA-C

## 2016-01-05 NOTE — Patient Instructions (Signed)
Increase fluid intake.  Use Saline nasal spray.  Take a daily multivitamin. Start the [employer]. Continue Coricidin and chronic allergy medications.  Place a humidifier in the bedroom.  Please call or return clinic if symptoms are not improving.  If not improving over next 48 hours, please fill the Doxycycline (antibiotic) and begin taking as directed.    Sinusitis Sinusitis is redness, soreness, and swelling (inflammation) of the paranasal sinuses. Paranasal sinuses are air pockets within the bones of your face (beneath the eyes, the middle of the forehead, or above the eyes). In healthy paranasal sinuses, mucus is able to drain out, and air is able to circulate through them by way of your nose. However, when your paranasal sinuses are inflamed, mucus and air can become trapped. This can allow bacteria and other germs to grow and cause infection. Sinusitis can develop quickly and last only a short time (acute) or continue over a long period (chronic). Sinusitis that lasts for more than 12 weeks is considered chronic.  CAUSES  Causes of sinusitis include:  Allergies.  Structural abnormalities, such as displacement of the cartilage that separates your nostrils (deviated septum), which can decrease the air flow through your nose and sinuses and affect sinus drainage.  Functional abnormalities, such as when the small hairs (cilia) that line your sinuses and help remove mucus do not work properly or are not present. SYMPTOMS  Symptoms of acute and chronic sinusitis are the same. The primary symptoms are pain and pressure around the affected sinuses. Other symptoms include:  Upper toothache.  Earache.  Headache.  Bad breath.  Decreased sense of smell and taste.  A cough, which worsens when you are lying flat.  Fatigue.  Fever.  Thick drainage from your nose, which often is green and may contain pus (purulent).  Swelling and warmth over the affected sinuses. DIAGNOSIS  Your caregiver  will perform a physical exam. During the exam, your caregiver may:  Look in your nose for signs of abnormal growths in your nostrils (nasal polyps).  Tap over the affected sinus to check for signs of infection.  View the inside of your sinuses (endoscopy) with a special imaging device with a light attached (endoscope), which is inserted into your sinuses. If your caregiver suspects that you have chronic sinusitis, one or more of the following tests may be recommended:  Allergy tests.  Nasal culture A sample of mucus is taken from your nose and sent to a lab and screened for bacteria.  Nasal cytology A sample of mucus is taken from your nose and examined by your caregiver to determine if your sinusitis is related to an allergy. TREATMENT  Most cases of acute sinusitis are related to a viral infection and will resolve on their own within 10 days. Sometimes medicines are prescribed to help relieve symptoms (pain medicine, decongestants, nasal steroid sprays, or saline sprays).  However, for sinusitis related to a bacterial infection, your caregiver will prescribe antibiotic medicines. These are medicines that will help kill the bacteria causing the infection.  Rarely, sinusitis is caused by a fungal infection. In theses cases, your caregiver will prescribe antifungal medicine. For some cases of chronic sinusitis, surgery is needed. Generally, these are cases in which sinusitis recurs more than 3 times per year, despite other treatments. HOME CARE INSTRUCTIONS   Drink plenty of water. Water helps thin the mucus so your sinuses can drain more easily.  Use a humidifier.  Inhale steam 3 to 4 times a day (for example,  sit in the bathroom with the shower running).  Apply a warm, moist washcloth to your face 3 to 4 times a day, or as directed by your caregiver.  Use saline nasal sprays to help moisten and clean your sinuses.  Take over-the-counter or prescription medicines for pain, discomfort,  or fever only as directed by your caregiver. SEEK IMMEDIATE MEDICAL CARE IF:  You have increasing pain or severe headaches.  You have nausea, vomiting, or drowsiness.  You have swelling around your face.  You have vision problems.  You have a stiff neck.  You have difficulty breathing. MAKE SURE YOU:   Understand these instructions.  Will watch your condition.  Will get help right away if you are not doing well or get worse. Document Released: 12/20/2004 Document Revised: 03/14/2011 Document Reviewed: 01/04/2011 [facility] Patient Information 2014 [place], [place].

## 2016-01-28 ENCOUNTER — Other Ambulatory Visit: Payer: Self-pay | Admitting: Family Medicine

## 2016-01-28 NOTE — Telephone Encounter (Signed)
Last OV 09/08/15 Clonazepam last filled 10/14/15 #60 with 1

## 2016-01-28 NOTE — Telephone Encounter (Signed)
Medication filled to pharmacy as requested.   

## 2016-02-05 ENCOUNTER — Encounter: Payer: Self-pay | Admitting: Family Medicine

## 2016-02-05 ENCOUNTER — Ambulatory Visit (INDEPENDENT_AMBULATORY_CARE_PROVIDER_SITE_OTHER): Payer: Medicare Other | Admitting: Family Medicine

## 2016-02-05 VITALS — BP 116/73 | HR 70 | Temp 98.1°F | Resp 16 | Ht 67.0 in | Wt 205.1 lb

## 2016-02-05 DIAGNOSIS — E781 Pure hyperglyceridemia: Secondary | ICD-10-CM | POA: Diagnosis not present

## 2016-02-05 DIAGNOSIS — E038 Other specified hypothyroidism: Secondary | ICD-10-CM | POA: Diagnosis not present

## 2016-02-05 DIAGNOSIS — I10 Essential (primary) hypertension: Secondary | ICD-10-CM | POA: Diagnosis not present

## 2016-02-05 LAB — CBC WITH DIFFERENTIAL/PLATELET
Basophils Absolute: 0.1 10*3/uL (ref 0.0–0.1)
Basophils Relative: 0.9 % (ref 0.0–3.0)
Eosinophils Absolute: 0.1 10*3/uL (ref 0.0–0.7)
Eosinophils Relative: 2.2 % (ref 0.0–5.0)
HCT: 33.9 % — ABNORMAL LOW (ref 36.0–46.0)
Hemoglobin: 10.6 g/dL — ABNORMAL LOW (ref 12.0–15.0)
LYMPHS ABS: 2 10*3/uL (ref 0.7–4.0)
Lymphocytes Relative: 30.5 % (ref 12.0–46.0)
MCHC: 31.4 g/dL (ref 30.0–36.0)
MCV: 73.6 fl — AB (ref 78.0–100.0)
MONOS PCT: 7.2 % (ref 3.0–12.0)
Monocytes Absolute: 0.5 10*3/uL (ref 0.1–1.0)
NEUTROS ABS: 4 10*3/uL (ref 1.4–7.7)
NEUTROS PCT: 59.2 % (ref 43.0–77.0)
PLATELETS: 243 10*3/uL (ref 150.0–400.0)
RBC: 4.6 Mil/uL (ref 3.87–5.11)
RDW: 17.7 % — ABNORMAL HIGH (ref 11.5–15.5)
WBC: 6.7 10*3/uL (ref 4.0–10.5)

## 2016-02-05 LAB — HEPATIC FUNCTION PANEL
ALBUMIN: 4.1 g/dL (ref 3.5–5.2)
ALT: 13 U/L (ref 0–35)
AST: 14 U/L (ref 0–37)
Alkaline Phosphatase: 65 U/L (ref 39–117)
Bilirubin, Direct: 0 mg/dL (ref 0.0–0.3)
TOTAL PROTEIN: 6.9 g/dL (ref 6.0–8.3)
Total Bilirubin: 0.3 mg/dL (ref 0.2–1.2)

## 2016-02-05 LAB — LIPID PANEL
CHOLESTEROL: 198 mg/dL (ref 0–200)
HDL: 37.6 mg/dL — AB (ref >39.00)
NonHDL: 160.79
Total CHOL/HDL Ratio: 5
Triglycerides: 253 mg/dL — ABNORMAL HIGH (ref 0.0–149.0)
VLDL: 50.6 mg/dL — AB (ref 0.0–40.0)

## 2016-02-05 LAB — BASIC METABOLIC PANEL
BUN: 20 mg/dL (ref 6–23)
CALCIUM: 9 mg/dL (ref 8.4–10.5)
CO2: 28 mEq/L (ref 19–32)
Chloride: 104 mEq/L (ref 96–112)
Creatinine, Ser: 0.76 mg/dL (ref 0.40–1.20)
GFR: 77.61 mL/min (ref >60.00)
GLUCOSE: 84 mg/dL (ref 70–99)
POTASSIUM: 4.4 meq/L (ref 3.5–5.1)
Sodium: 139 mEq/L (ref 135–145)

## 2016-02-05 LAB — LDL CHOLESTEROL, DIRECT: Direct LDL: 129 mg/dL

## 2016-02-05 LAB — TSH: TSH: 3.62 u[IU]/mL (ref 0.35–4.50)

## 2016-02-05 NOTE — Assessment & Plan Note (Signed)
Ongoing issue for pt.  Not currently on medication.  She was to be working on [employer] Grouphealthy diet and regular exercise to avoid medication.  Check labs.  Start meds prn

## 2016-02-05 NOTE — Progress Notes (Signed)
Pre visit review using our clinic review tool, if applicable. No additional management support is needed unless otherwise documented below in the visit note. 

## 2016-02-05 NOTE — Assessment & Plan Note (Signed)
Chronic problem.  Currently asymptomatic.  Check labs.  Adjust meds prn  

## 2016-02-05 NOTE — Progress Notes (Signed)
   Subjective:    Patient ID: Samantha Briggs, female    DOB: [birth year], 83 y.o.   MRN: [id number]  HPI HTN- chronic problem, on HCTZ and Valsartan daily w/ good control.  Denies CP, SOB, HAs, visual changes, edema.  Hyperlipidemia- last LDL 146.  Pt was to work on healthy diet and regular exercise to avoid medication.  Denies abd pain, N/V, myalgias.  Hypothyroid- chronic problem, on Levothyroxine daily.  Denies fatigue, changes to bowel or bladder habits.  No changes to skin/hair/nails.   Review of Systems For ROS see HPI     Objective:   Physical Exam  Constitutional: She is oriented to person, place, and time. She appears well-developed and well-nourished. No distress.  HENT:  Head: Normocephalic and atraumatic.  Eyes: Conjunctivae and EOM are normal. Pupils are equal, round, and reactive to light.  Neck: Normal range of motion. Neck supple. No thyromegaly present.  Cardiovascular: Normal rate, regular rhythm, normal heart sounds and intact distal pulses.   No murmur heard. Pulmonary/Chest: Effort normal and breath sounds normal. No respiratory distress.  Abdominal: Soft. She exhibits no distension. There is no tenderness.  Musculoskeletal: She exhibits no edema.  Lymphadenopathy:    She has no cervical adenopathy.  Neurological: She is alert and oriented to person, place, and time.  Skin: Skin is warm and dry.  Psychiatric: She has a normal mood and affect. Her behavior is normal.  Vitals reviewed.         Assessment & Plan:

## 2016-02-05 NOTE — Assessment & Plan Note (Signed)
Chronic problem.  Well controlled today.  Asymptomatic.  Check labs.  No anticipated med changes.  Will follow. 

## 2016-02-05 NOTE — Patient Instructions (Signed)
Schedule your complete physical and your annual wellness visit w/ Selena BattenKim (our health coach) in 6 months We'll notify you of your lab results and make any changes if needed Continue to work on healthy diet and exercise as you are able- you can do it! Call with any questions or concerns Happy New Year!!!

## 2016-02-09 ENCOUNTER — Encounter: Payer: Self-pay | Admitting: General Practice

## 2016-02-09 ENCOUNTER — Other Ambulatory Visit: Payer: Self-pay | Admitting: General Practice

## 2016-02-09 DIAGNOSIS — D649 Anemia, unspecified: Secondary | ICD-10-CM

## 2016-02-09 MED ORDER — FERROUS SULFATE 325 (65 FE) MG PO TABS: 325.0000 mg | ORAL_TABLET | Freq: Every day | ORAL | 6 refills | Status: DC

## 2016-02-09 MED ORDER — FENOFIBRATE 160 MG PO TABS: 160.0000 mg | ORAL_TABLET | Freq: Every day | ORAL | 6 refills | Status: DC

## 2016-02-26 ENCOUNTER — Other Ambulatory Visit: Payer: Self-pay | Admitting: Family Medicine

## 2016-03-21 ENCOUNTER — Other Ambulatory Visit: Payer: Self-pay | Admitting: Family Medicine

## 2016-03-30 ENCOUNTER — Other Ambulatory Visit: Payer: Self-pay | Admitting: Family Medicine

## 2016-03-30 NOTE — Telephone Encounter (Signed)
RF request for clonazepam. Last RF 01/28/16 x 1 rf Last OV 02/05/16 No upcoming OV  Please advise.

## 2016-03-30 NOTE — Telephone Encounter (Signed)
Rx faxed

## 2016-05-06 ENCOUNTER — Encounter: Payer: Self-pay | Admitting: Family Medicine

## 2016-05-06 ENCOUNTER — Ambulatory Visit (INDEPENDENT_AMBULATORY_CARE_PROVIDER_SITE_OTHER): Payer: Medicare Other | Admitting: Family Medicine

## 2016-05-06 VITALS — BP 128/83 | HR 88 | Temp 97.9°F | Resp 17 | Ht 67.0 in | Wt 206.1 lb

## 2016-05-06 DIAGNOSIS — J069 Acute upper respiratory infection, unspecified: Secondary | ICD-10-CM | POA: Diagnosis not present

## 2016-05-06 MED ORDER — GUAIFENESIN-CODEINE 100-10 MG/5ML PO SYRP: 10.0000 mL | ORAL_SOLUTION | Freq: Three times a day (TID) | ORAL | 0 refills | Status: DC | PRN

## 2016-05-06 NOTE — Progress Notes (Signed)
Pre visit review using our clinic review tool, if applicable. No additional management support is needed unless otherwise documented below in the visit note. 

## 2016-05-06 NOTE — Patient Instructions (Signed)
Follow up as needed/scheduled Start the cough syrup for nights/weekends- may cause drowsiness Use Mucinex DM or Delsym for daytime cough Drink plenty of fluids REST! Restart the Flonase daily Call with any questions or concerns Hang in there!!

## 2016-05-06 NOTE — Progress Notes (Signed)
   Subjective:    Patient ID: Samantha Briggs, female    DOB: 01-07-1932, 83 y.o.   MRN: [id number]  HPI URI- 'i think I caught a cold'.  sxs started ~2 weeks ago and have been worsening rather than improving.  No sinus pain/pressure.  + sore throat.  Bilateral ear pain.  + nasal congestion, PND. + cough.  Taking Claritin daily but not using nasal spray regularly.  Pt is not sleeping well at night.   Review of Systems For ROS see HPI     Objective:   Physical Exam  Constitutional: She is oriented to person, place, and time. She appears well-developed and well-nourished. No distress.  HENT:  Head: Normocephalic and atraumatic.  Right Ear: Tympanic membrane normal.  Left Ear: Tympanic membrane normal.  Nose: Mucosal edema and rhinorrhea present. Right sinus exhibits no maxillary sinus tenderness and no frontal sinus tenderness. Left sinus exhibits no maxillary sinus tenderness and no frontal sinus tenderness.  Mouth/Throat: Mucous membranes are normal. Posterior oropharyngeal erythema (w/ PND) present.  Eyes: Conjunctivae and EOM are normal. Pupils are equal, round, and reactive to light.  Neck: Normal range of motion. Neck supple.  Cardiovascular: Normal rate, regular rhythm and normal heart sounds.   Pulmonary/Chest: Effort normal and breath sounds normal. No respiratory distress. She has no wheezes. She has no rales.  Lymphadenopathy:    She has no cervical adenopathy.  Neurological: She is alert and oriented to person, place, and time.  Skin: Skin is warm and dry.  Psychiatric: She has a normal mood and affect. Her behavior is normal. Thought content normal.  Vitals reviewed.         Assessment & Plan:  Viral URI- new.  Pt's sxs and PE are consistent w/ viral/allergy combo.  No evidence of bacterial infxn on exam.  Pt reports she has taken codeine before w/o difficulty so will start codeine cough syrup.  Restart Flonase.  Continue Claritin.  Reviewed supportive care and red  flags that should prompt return.  Pt expressed understanding and is in agreement w/ plan.

## 2016-05-26 ENCOUNTER — Other Ambulatory Visit: Payer: Self-pay | Admitting: Family Medicine

## 2016-06-22 DIAGNOSIS — H401123 Primary open-angle glaucoma, left eye, severe stage: Secondary | ICD-10-CM | POA: Diagnosis not present

## 2016-06-22 DIAGNOSIS — H04123 Dry eye syndrome of bilateral lacrimal glands: Secondary | ICD-10-CM | POA: Diagnosis not present

## 2016-07-24 ENCOUNTER — Other Ambulatory Visit: Payer: Self-pay | Admitting: Family Medicine

## 2016-08-10 ENCOUNTER — Ambulatory Visit: Payer: Medicare Other | Admitting: Family Medicine

## 2016-08-26 ENCOUNTER — Other Ambulatory Visit: Payer: Self-pay | Admitting: Family Medicine

## 2016-08-30 ENCOUNTER — Telehealth: Payer: Self-pay | Admitting: General Practice

## 2016-08-30 MED ORDER — OLMESARTAN MEDOXOMIL 40 MG PO TABS: 40.0000 mg | ORAL_TABLET | Freq: Every day | ORAL | 1 refills | Status: DC

## 2016-08-30 NOTE — Telephone Encounter (Signed)
Pt made aware and rx sent to local pharmacy as requested. Pt also scheduled a Bp follow up in 2 weeks.

## 2016-08-30 NOTE — Addendum Note (Signed)
Addended by: Geannie Risen on: 08/30/2016 12:18 PM   Modules accepted: Orders

## 2016-08-30 NOTE — Telephone Encounter (Signed)
Please switch to Benicar 40mg daily 

## 2016-08-30 NOTE — Telephone Encounter (Signed)
Received a fax from [facility] advising that pt valsartan is currently part of the recall. Please advise on a change of medication. Pt currently on valsartan 320mg .

## 2016-08-31 ENCOUNTER — Telehealth: Payer: Self-pay

## 2016-08-31 NOTE — Telephone Encounter (Signed)
LM requesting call back regarding AWV. Requesting pt to complete AWV with Health Coach on 09/15/16 @ 1030, prior to appt with PCP.

## 2016-09-12 NOTE — Telephone Encounter (Signed)
LM requesting call back regarding AWV.  

## 2016-09-13 ENCOUNTER — Other Ambulatory Visit: Payer: Self-pay | Admitting: Family Medicine

## 2016-09-15 ENCOUNTER — Other Ambulatory Visit: Payer: Self-pay | Admitting: General Practice

## 2016-09-15 ENCOUNTER — Ambulatory Visit: Payer: Medicare Other

## 2016-09-15 ENCOUNTER — Ambulatory Visit (INDEPENDENT_AMBULATORY_CARE_PROVIDER_SITE_OTHER): Payer: Medicare Other | Admitting: Family Medicine

## 2016-09-15 ENCOUNTER — Encounter: Payer: Self-pay | Admitting: Family Medicine

## 2016-09-15 VITALS — BP 120/60 | HR 68 | Temp 98.0°F | Resp 18 | Ht 67.0 in | Wt 208.6 lb

## 2016-09-15 DIAGNOSIS — Z23 Encounter for immunization: Secondary | ICD-10-CM

## 2016-09-15 DIAGNOSIS — Z0000 Encounter for general adult medical examination without abnormal findings: Secondary | ICD-10-CM

## 2016-09-15 DIAGNOSIS — E038 Other specified hypothyroidism: Secondary | ICD-10-CM

## 2016-09-15 DIAGNOSIS — I10 Essential (primary) hypertension: Secondary | ICD-10-CM | POA: Diagnosis not present

## 2016-09-15 LAB — CBC WITH DIFFERENTIAL/PLATELET
Basophils Absolute: 0 10*3/uL (ref 0.0–0.1)
Basophils Relative: 0.6 % (ref 0.0–3.0)
EOS ABS: 0.1 10*3/uL (ref 0.0–0.7)
Eosinophils Relative: 1.5 % (ref 0.0–5.0)
HEMATOCRIT: 37.5 % (ref 36.0–46.0)
HEMOGLOBIN: 12.3 g/dL (ref 12.0–15.0)
LYMPHS PCT: 23.9 % (ref 12.0–46.0)
Lymphs Abs: 1.1 10*3/uL (ref 0.7–4.0)
MCHC: 32.8 g/dL (ref 30.0–36.0)
MCV: 86 fl (ref 78.0–100.0)
MONO ABS: 0.4 10*3/uL (ref 0.1–1.0)
Monocytes Relative: 9.6 % (ref 3.0–12.0)
Neutro Abs: 2.9 10*3/uL (ref 1.4–7.7)
Neutrophils Relative %: 64.4 % (ref 43.0–77.0)
Platelets: 192 10*3/uL (ref 150.0–400.0)
RBC: 4.36 Mil/uL (ref 3.87–5.11)
RDW: 14.8 % (ref 11.5–15.5)
WBC: 4.5 10*3/uL (ref 4.0–10.5)

## 2016-09-15 LAB — LIPID PANEL
CHOL/HDL RATIO: 6
CHOLESTEROL: 191 mg/dL (ref 0–200)
HDL: 34.5 mg/dL — AB (ref >39.00)
NONHDL: 156.15
TRIGLYCERIDES: 294 mg/dL — AB (ref 0.0–149.0)
VLDL: 58.8 mg/dL — ABNORMAL HIGH (ref 0.0–40.0)

## 2016-09-15 LAB — BASIC METABOLIC PANEL
BUN: 21 mg/dL (ref 6–23)
CALCIUM: 9.2 mg/dL (ref 8.4–10.5)
CHLORIDE: 103 meq/L (ref 96–112)
CO2: 27 meq/L (ref 19–32)
CREATININE: 0.92 mg/dL (ref 0.40–1.20)
GFR: 62.16 mL/min (ref >60.00)
Glucose, Bld: 100 mg/dL — ABNORMAL HIGH (ref 70–99)
Potassium: 4 mEq/L (ref 3.5–5.1)
Sodium: 139 mEq/L (ref 135–145)

## 2016-09-15 LAB — TSH: TSH: 4.72 u[IU]/mL — AB (ref 0.35–4.50)

## 2016-09-15 LAB — HEPATIC FUNCTION PANEL
ALBUMIN: 4.2 g/dL (ref 3.5–5.2)
ALT: 21 U/L (ref 0–35)
AST: 25 U/L (ref 0–37)
Alkaline Phosphatase: 47 U/L (ref 39–117)
BILIRUBIN TOTAL: 0.3 mg/dL (ref 0.2–1.2)
Bilirubin, Direct: 0.1 mg/dL (ref 0.0–0.3)
Total Protein: 6.7 g/dL (ref 6.0–8.3)

## 2016-09-15 LAB — LDL CHOLESTEROL, DIRECT: LDL DIRECT: 119 mg/dL

## 2016-09-15 MED ORDER — LEVOTHYROXINE SODIUM 75 MCG PO TABS: 75.0000 ug | ORAL_TABLET | Freq: Every day | ORAL | 6 refills | Status: DC

## 2016-09-15 MED ORDER — ZOSTER VAC RECOMB ADJUVANTED 50 MCG/0.5ML IM SUSR: 0.5000 mL | Freq: Once | INTRAMUSCULAR | 1 refills | Status: AC

## 2016-09-15 NOTE — Progress Notes (Signed)
   Subjective:    Patient ID: Samantha Briggs, female    DOB: 23-Feb-1932, 83 y.o.   MRN: [id number]  HPI CPE- UTD on colonoscopy, declines mammo, DEXA.  UTD on immunizations   Review of Systems Patient reports no vision/ hearing changes, adenopathy,fever, weight change,  persistant/recurrent hoarseness , swallowing issues, chest pain, palpitations, edema, persistant/recurrent cough, hemoptysis, dyspnea (rest/exertional/paroxysmal nocturnal), gastrointestinal bleeding (melena, rectal bleeding), abdominal pain, significant heartburn, bowel changes, GU symptoms (dysuria, hematuria, incontinence), Gyn symptoms (abnormal  bleeding, pain),  syncope, focal weakness, memory loss, numbness & tingling, skin/hair/nail changes, abnormal bruising or bleeding, anxiety, or depression.     Objective:   Physical Exam General Appearance:    Alert, cooperative, no distress, appears stated age  Head:    Normocephalic, without obvious abnormality, atraumatic  Eyes:    PERRL, conjunctiva/corneas clear, EOM's intact, fundi    benign, both eyes  Ears:    Normal TM's and external ear canals, both ears  Nose:   Nares normal, septum midline, mucosa normal, no drainage    or sinus tenderness  Throat:   Lips, mucosa, and tongue normal; teeth and gums normal  Neck:   Supple, symmetrical, trachea midline, no adenopathy;    Thyroid: no enlargement/tenderness/nodules  Back:     Symmetric, no curvature, ROM normal, no CVA tenderness  Lungs:     Clear to auscultation bilaterally, respirations unlabored  Chest Wall:    No tenderness or deformity   Heart:    Regular rate and rhythm, S1 and S2 normal, no murmur, rub   or gallop  Breast Exam:    Deferred   Abdomen:     Soft, non-tender, bowel sounds active all four quadrants,    no masses, no organomegaly  Genitalia:    Deferred  Rectal:    Extremities:   Extremities normal, atraumatic, no cyanosis or edema  Pulses:   2+ and symmetric all extremities  Skin:   Skin  color, texture, turgor normal, no rashes or lesions  Lymph nodes:   Cervical, supraclavicular, and axillary nodes normal  Neurologic:   CNII-XII intact, normal strength, sensation and reflexes    throughout          Assessment & Plan:

## 2016-09-15 NOTE — Progress Notes (Signed)
Subjective:   Samantha Briggs is a 83 y.o. female who presents for Medicare Annual (Subsequent) preventive examination.  Review of Systems:  No ROS.  Medicare Wellness Visit. Additional risk factors are reflected in the social history.  Cardiac Risk Factors include: advanced age (>48men, >72 women);dyslipidemia;hypertension;obesity (BMI >30kg/m2);sedentary lifestyle   Sleep patterns: Sleeps 8 hours.  Home Safety/Smoke Alarms: Feels safe in home. Smoke alarms in place.  Living environment; residence and Firearm Safety: [place]  Seat Belt Safety/Bike Helmet: Wears seat belt.   Female:   Pap-N/A     Mammo-Declines testing.     Dexa scan-01/03/2009. Declines testing.         CCS-Colonoscopy 01/03/2009, no recall.      Objective:     Vitals: BP 120/60 (BP Location: Left Arm, Patient Position: Sitting, Cuff Size: Normal)   Pulse 68   Temp 98 F (36.7 C) (Temporal)   Resp 18   Ht  (1.702 m)   Wt 208 lb 9.6 oz (94.6 kg)   SpO2 97%   BMI 32.67 kg/m   Body mass index is 32.67 kg/m.   Tobacco History  Smoking Status  . Former Smoker  . Types: Cigarettes  . Quit date: 01/04/1972  Smokeless Tobacco  . Never Used     Counseling given: Not Answered   Past Medical History:  Diagnosis Date  . Allergy   . Anemia   . Depression   . Glaucoma   . Hyperlipidemia   . Hypertension    Past Surgical History:  Procedure Laterality Date  . CHOLECYSTECTOMY    . CORNEAL TRANSPLANT     x2  . DILATION AND CURETTAGE OF UTERUS    . RETINAL DETACHMENT SURGERY     Family History  Problem Relation Age of Onset  . Cancer Mother   . Breast cancer Mother   . Cancer Father   . Pancreatic cancer Father   . Diabetes Sister    History  Sexual Activity  . Sexual activity: Not on file    Outpatient Encounter Prescriptions as of 09/15/2016  Medication Sig  . aspirin 81 MG tablet Take 81 mg by mouth daily.    . cetirizine (ZYRTEC) 10 MG  tablet Take 10 mg by mouth daily.  . clonazePAM (KLONOPIN) 0.5 MG tablet TAKE 1 TABLET TWICE A DAY AS NEEDED  . dorzolamide (TRUSOPT) 2 % ophthalmic solution INSTILL ONE DROP INTO LEFT EYE TWICE DAILY  . fenofibrate 160 MG tablet TAKE 1 TABLET (160 MG TOTAL) BY MOUTH DAILY.  . ferrous sulfate 325 (65 FE) MG tablet Take 1 tablet (325 mg total) by mouth daily with breakfast.  . fluticasone (FLONASE) 50 MCG/ACT nasal spray Place 2 sprays into both nostrils daily.  . hydrochlorothiazide (HYDRODIURIL) 12.5 MG tablet TAKE 1 TABLET (12.5 MG TOTAL) BY MOUTH DAILY.  Marland Kitchen latanoprost (XALATAN) 0.005 % ophthalmic solution Place 1 drop into the left eye daily.   Marland Kitchen levothyroxine (SYNTHROID, LEVOTHROID) 50 MCG tablet TAKE 1 TABLET (50 MCG TOTAL) BY MOUTH DAILY.  Marland Kitchen olmesartan (BENICAR) 40 MG tablet Take 1 tablet (40 mg total) by mouth daily.  Marland Kitchen omeprazole (PRILOSEC) 20 MG capsule TAKE ONE CAPSULE BY MOUTH ONE TIME DAILY  . PRED MILD 0.12 % ophthalmic suspension Place 1 drop into both eyes.   Marland Kitchen sertraline (ZOLOFT) 50 MG tablet TAKE 1 TABLET BY MOUTH DAILY.  Marland Kitchen FLUZONE HIGH-DOSE 0.5 ML injection   . guaiFENesin-codeine (ROBITUSSIN AC) 100-10 MG/5ML  syrup Take 10 mLs by mouth 3 (three) times daily as needed for cough. (Patient not taking: Reported on 09/15/2016)  . Probiotic Product (PROBIOTIC DAILY) CAPS Take by mouth.  . valsartan (DIOVAN) 320 MG tablet TAKE ONE TABLET BY MOUTH ONE TIME DAILY (Patient not taking: Reported on 09/15/2016)  . Zoster Vac Recomb Adjuvanted [facility]) injection Inject 0.5 mLs into the muscle once.  . [DISCONTINUED] Iron Combinations (IRON COMPLEX PO) Take 65 mg by mouth daily.   No facility-administered encounter medications on file as of 09/15/2016.     Activities of Daily Living In your present state of health, do you have any difficulty performing the following activities: 09/15/2016  Hearing? N  Vision? N  Difficulty concentrating or making decisions? N  Walking or climbing  stairs? N  Dressing or bathing? N  Doing errands, shopping? N  Preparing Food and eating ? N  Using the Toilet? N  In the past six months, have you accidently leaked urine? N  Do you have problems with loss of bowel control? N  Managing your Medications? N  Managing your Finances? N  Housekeeping or managing your Housekeeping? N  Some recent data might be hidden    Patient Care Team: Sheliah Hatch, MD as PCP - General (Family Medicine)    Assessment:    Physical assessment deferred to PCP.  Exercise Activities and Dietary recommendations Current Exercise Habits: The patient does not participate in regular exercise at present [facility]; active in church), Exercise limited by: None identified   Diet (meal preparation, eat out, water intake, caffeinated beverages, dairy products, fruits and vegetables): Drinks water.   Breakfast: coffee, waffle; toast; cottage with granola, apple sauce; eggs Lunch: sandwich; salad; fruit Dinner: protein and vegetables.     Goals      Patient Stated   . patient (pt-stated)          Maintain current health, begin water coloring again.       Fall Risk Fall Risk  09/15/2016 09/08/2015 04/21/2015 04/10/2014 03/13/2013  Falls in the past year? No No No No No   Depression Screen PHQ 2/9 Scores 09/15/2016 09/08/2015 04/21/2015 04/10/2014  PHQ - 2 Score 0 0 0 0  Exception Documentation Patient refusal - - -     Cognitive Function       Ad8 score reviewed for issues:  Issues making decisions: no  Less interest in hobbies / activities: no  Repeats questions, stories (family complaining): no  Trouble using ordinary gadgets (microwave, computer, phone): no  Forgets the month or year: no  Mismanaging finances: no  Remembering appts: no  Daily problems with thinking and/or memory: no Ad8 score is=0     Immunization History  Administered Date(s) Administered  . Influenza, High Dose Seasonal PF 09/25/2013, 08/29/2014, 10/21/2015,  08/29/2016  . Influenza,inj,Quad PF,6+ Mos 09/19/2012  . Pneumococcal Conjugate-13 04/10/2014  . Pneumococcal Polysaccharide-23 11/04/2011  . Td 12/12/2012  . Tdap 10/28/2013  . Zoster 06/02/2011   Screening Tests Health Maintenance  Topic Date Due  . TETANUS/TDAP  10/29/2023  . INFLUENZA VACCINE  Completed  . DEXA SCAN  Completed  . PNA vac Low Risk Adult  Completed   Shingrix Rx sent to pharmacy.      Plan:     Shingles vaccine at pharmacy.   Continue doing brain stimulating activities (puzzles, reading, adult coloring books, staying active) to keep memory sharp.    I have personally reviewed and noted the following in the patient's chart:   .  Medical and social history . Use of alcohol, tobacco or illicit drugs  . Current medications and supplements . Functional ability and status . Nutritional status . Physical activity . Advanced directives . List of other physicians . Hospitalizations, surgeries, and ER visits in previous 12 months . Vitals . Screenings to include cognitive, depression, and falls . Referrals and appointments  In addition, I have reviewed and discussed with patient certain preventive protocols, quality metrics, and best practice recommendations. A written personalized care plan for preventive services as well as general preventive health recommendations were provided to patient.     Alysia PennaKimberly R Rontrell Moquin, RN  09/15/2016  Reviewed documentation and agree w/ above.  Neena RhymesKatherine Tabori, MD

## 2016-09-15 NOTE — Assessment & Plan Note (Signed)
Pt's PE WNL.  UTD on colonoscopy.  Declines mammo, DEXA.  UTD on immunizations.  Check labs.  Anticipatory guidance provided.

## 2016-09-15 NOTE — Patient Instructions (Addendum)
Follow up in 6 months to recheck BP and cholesterol We'll notify you of your lab results and make any changes if needed Continue to exercise- you look great!!! Call with any questions or concerns STAY SAFE!!!  Shingles vaccine at pharmacy.   Continue doing brain stimulating activities (puzzles, reading, adult coloring books, staying active) to keep memory sharp.   Health Maintenance, Female Adopting a healthy lifestyle and getting preventive care can go a long way to promote health and wellness. Talk with your health care provider about what schedule of regular examinations is right for you. This is a good chance for you to check in with your provider about disease prevention and staying healthy. In between checkups, there are plenty of things you can do on your own. Experts have done a lot of research about which lifestyle changes and preventive measures are most likely to keep you healthy. Ask your health care provider for more information. Weight and diet Eat a healthy diet  Be sure to include plenty of vegetables, fruits, low-fat dairy products, and lean protein.  Do not eat a lot of foods high in solid fats, added sugars, or salt.  Get regular exercise. This is one of the most important things you can do for your health. ? Most adults should exercise for at least 150 minutes each week. The exercise should increase your heart rate and make you sweat (moderate-intensity exercise). ? Most adults should also do strengthening exercises at least twice a week. This is in addition to the moderate-intensity exercise.  Maintain a healthy weight  Body mass index (BMI) is a measurement that can be used to identify possible weight problems. It estimates body fat based on height and weight. Your health care provider can help determine your BMI and help you achieve or maintain a healthy weight.  For females 20 years of age and older: ? A BMI below 18.5 is considered underweight. ? A BMI of 18.5  to 24.9 is normal. ? A BMI of 25 to 29.9 is considered overweight. ? A BMI of 30 and above is considered obese.  Watch levels of cholesterol and blood lipids  You should start having your blood tested for lipids and cholesterol at 80 years of age, then have this test every 5 years.  You may need to have your cholesterol levels checked more often if: ? Your lipid or cholesterol levels are high. ? You are older than 81 years of age. ? You are at high risk for heart disease.  Cancer screening Lung Cancer  Lung cancer screening is recommended for adults 92-16 years old who are at high risk for lung cancer because of a history of smoking.  A yearly low-dose CT scan of the lungs is recommended for people who: ? Currently smoke. ? Have quit within the past 15 years. ? Have at least a 30-pack-year history of smoking. A pack year is smoking an average of one pack of cigarettes a day for 1 year.  Yearly screening should continue until it has been 15 years since you quit.  Yearly screening should stop if you develop a health problem that would prevent you from having lung cancer treatment.  Breast Cancer  Practice breast self-awareness. This means understanding how your breasts normally appear and feel.  It also means doing regular breast self-exams. Let your health care provider know about any changes, no matter how small.  If you are in your 20s or 30s, you should have a clinical breast exam (CBE)  by a health care provider every 1-3 years as part of a regular health exam.  If you are 91 or older, have a CBE every year. Also consider having a breast X-ray (mammogram) every year.  If you have a family history of breast cancer, talk to your health care provider about genetic screening.  If you are at high risk for breast cancer, talk to your health care provider about having an MRI and a mammogram every year.  Breast cancer gene (BRCA) assessment is recommended for women who have family  members with BRCA-related cancers. BRCA-related cancers include: ? Breast. ? Ovarian. ? Tubal. ? Peritoneal cancers.  Results of the assessment will determine the need for genetic counseling and BRCA1 and BRCA2 testing.  Cervical Cancer Your health care provider may recommend that you be screened regularly for cancer of the pelvic organs (ovaries, uterus, and vagina). This screening involves a pelvic examination, including checking for microscopic changes to the surface of your cervix (Pap test). You may be encouraged to have this screening done every 3 years, beginning at age 32.  For women ages 66-65, health care providers may recommend pelvic exams and Pap testing every 3 years, or they may recommend the Pap and pelvic exam, combined with testing for human papilloma virus (HPV), every 5 years. Some types of HPV increase your risk of cervical cancer. Testing for HPV may also be done on women of any age with unclear Pap test results.  Other health care providers may not recommend any screening for nonpregnant women who are considered low risk for pelvic cancer and who do not have symptoms. Ask your health care provider if a screening pelvic exam is right for you.  If you have had past treatment for cervical cancer or a condition that could lead to cancer, you need Pap tests and screening for cancer for at least 20 years after your treatment. If Pap tests have been discontinued, your risk factors (such as having a new sexual partner) need to be reassessed to determine if screening should resume. Some women have medical problems that increase the chance of getting cervical cancer. In these cases, your health care provider may recommend more frequent screening and Pap tests.  Colorectal Cancer  This type of cancer can be detected and often prevented.  Routine colorectal cancer screening usually begins at 82 years of age and continues through 81 years of age.  Your health care provider may  recommend screening at an earlier age if you have risk factors for colon cancer.  Your health care provider may also recommend using home test kits to check for hidden blood in the stool.  A small camera at the end of a tube can be used to examine your colon directly (sigmoidoscopy or colonoscopy). This is done to check for the earliest forms of colorectal cancer.  Routine screening usually begins at age 38.  Direct examination of the colon should be repeated every 5-10 years through 81 years of age. However, you may need to be screened more often if early forms of precancerous polyps or small growths are found.  Skin Cancer  Check your skin from head to toe regularly.  Tell your health care provider about any new moles or changes in moles, especially if there is a change in a mole's shape or color.  Also tell your health care provider if you have a mole that is larger than the size of a pencil eraser.  Always use sunscreen. Apply sunscreen liberally and  repeatedly throughout the day.  Protect yourself by wearing long sleeves, pants, a wide-brimmed hat, and sunglasses whenever you are outside.  Heart disease, diabetes, and high blood pressure  High blood pressure causes heart disease and increases the risk of stroke. High blood pressure is more likely to develop in: ? People who have blood pressure in the high end of the normal range (130-139/85-89 mm Hg). ? People who are overweight or obese. ? People who are African American.  If you are 50-51 years of age, have your blood pressure checked every 3-5 years. If you are 105 years of age or older, have your blood pressure checked every year. You should have your blood pressure measured twice-once when you are at a hospital or clinic, and once when you are not at a hospital or clinic. Record the average of the two measurements. To check your blood pressure when you are not at a hospital or clinic, you can use: ? An automated blood pressure  machine at a pharmacy. ? A home blood pressure monitor.  If you are between 6 years and 83 years old, ask your health care provider if you should take aspirin to prevent strokes.  Have regular diabetes screenings. This involves taking a blood sample to check your fasting blood sugar level. ? If you are at a normal weight and have a low risk for diabetes, have this test once every three years after 81 years of age. ? If you are overweight and have a high risk for diabetes, consider being tested at a younger age or more often. Preventing infection Hepatitis B  If you have a higher risk for hepatitis B, you should be screened for this virus. You are considered at high risk for hepatitis B if: ? You were born in a country where hepatitis B is common. Ask your health care provider which countries are considered high risk. ? Your parents were born in a high-risk country, and you have not been immunized against hepatitis B (hepatitis B vaccine). ? You have HIV or AIDS. ? You use needles to inject street drugs. ? You live with someone who has hepatitis B. ? You have had sex with someone who has hepatitis B. ? You get hemodialysis treatment. ? You take certain medicines for conditions, including cancer, organ transplantation, and autoimmune conditions.  Hepatitis C  Blood testing is recommended for: ? Everyone born from [birth year] through 1963. ? Anyone with known risk factors for hepatitis C.  Sexually transmitted infections (STIs)  You should be screened for sexually transmitted infections (STIs) including gonorrhea and chlamydia if: ? You are sexually active and are younger than 81 years of age. ? You are older than 81 years of age and your health care provider tells you that you are at risk for this type of infection. ? Your sexual activity has changed since you were last screened and you are at an increased risk for chlamydia or gonorrhea. Ask your health care provider if you are at  risk.  If you do not have HIV, but are at risk, it may be recommended that you take a prescription medicine daily to prevent HIV infection. This is called pre-exposure prophylaxis (PrEP). You are considered at risk if: ? You are sexually active and do not regularly use condoms or know the HIV status of your partner(s). ? You take drugs by injection. ? You are sexually active with a partner who has HIV.  Talk with your health care provider about whether you  are at high risk of being infected with HIV. If you choose to begin PrEP, you should first be tested for HIV. You should then be tested every 3 months for as long as you are taking PrEP. Pregnancy  If you are premenopausal and you may become pregnant, ask your health care provider about preconception counseling.  If you may become pregnant, take 400 to 800 micrograms (mcg) of folic acid every day.  If you want to prevent pregnancy, talk to your health care provider about birth control (contraception). Osteoporosis and menopause  Osteoporosis is a disease in which the bones lose minerals and strength with aging. This can result in serious bone fractures. Your risk for osteoporosis can be identified using a bone density scan.  If you are 90 years of age or older, or if you are at risk for osteoporosis and fractures, ask your health care provider if you should be screened.  Ask your health care provider whether you should take a calcium or vitamin D supplement to lower your risk for osteoporosis.  Menopause may have certain physical symptoms and risks.  Hormone replacement therapy may reduce some of these symptoms and risks. Talk to your health care provider about whether hormone replacement therapy is right for you. Follow these instructions at home:  Schedule regular health, dental, and eye exams.  Stay current with your immunizations.  Do not use any tobacco products including cigarettes, chewing tobacco, or electronic  cigarettes.  If you are pregnant, do not drink alcohol.  If you are breastfeeding, limit how much and how often you drink alcohol.  Limit alcohol intake to no more than 1 drink per day for nonpregnant women. One drink equals 12 ounces of beer, 5 ounces of wine, or 1 ounces of hard liquor.  Do not use street drugs.  Do not share needles.  Ask your health care provider for help if you need support or information about quitting drugs.  Tell your health care provider if you often feel depressed.  Tell your health care provider if you have ever been abused or do not feel safe at home. This information is not intended to replace advice given to you by your health care provider. Make sure you discuss any questions you have with your health care provider. Document Released: 07/05/2010 Document Revised: 05/28/2015 Document Reviewed: 09/23/2014 Elsevier Interactive Patient Education  [employer].

## 2016-09-15 NOTE — Assessment & Plan Note (Signed)
Chronic problem.  Excellent control since switching her ARB due to Valsartan recall.  Asymptomatic.  Check labs.  No anticipated med changes.  Will follow.

## 2016-09-16 ENCOUNTER — Other Ambulatory Visit: Payer: Self-pay | Admitting: Family Medicine

## 2016-10-03 ENCOUNTER — Encounter: Payer: Self-pay | Admitting: General Practice

## 2016-10-03 ENCOUNTER — Ambulatory Visit (INDEPENDENT_AMBULATORY_CARE_PROVIDER_SITE_OTHER): Payer: Medicare Other | Admitting: Family Medicine

## 2016-10-03 ENCOUNTER — Encounter: Payer: Self-pay | Admitting: Family Medicine

## 2016-10-03 VITALS — BP 112/72 | HR 86 | Temp 97.9°F | Resp 16 | Ht 67.0 in | Wt 209.0 lb

## 2016-10-03 DIAGNOSIS — E038 Other specified hypothyroidism: Secondary | ICD-10-CM

## 2016-10-03 DIAGNOSIS — F418 Other specified anxiety disorders: Secondary | ICD-10-CM | POA: Diagnosis not present

## 2016-10-03 LAB — TSH: TSH: 2.76 u[IU]/mL (ref 0.35–4.50)

## 2016-10-03 NOTE — Progress Notes (Signed)
   Subjective:    Patient ID: Samantha Briggs, female    DOB: Jun 28, 1932, 83 y.o.   MRN: [id number]  HPI Hypothyroid- chronic problem.  TSH was elevated at last visit and Levothyroxine was increased to daily.  Pt reports feeling 'really good' since adjusting thyroid medication and BP meds.  Anxiety- pt is doing well on her sertraline and taking the Clonazepam as needed.  Pt states she has been taking 1 tab daily but our refill records do not reflect her using this regularly.  Review of Systems For ROS see HPI     Objective:   Physical Exam  Constitutional: She is oriented to person, place, and time. She appears well-developed and well-nourished. No distress.  HENT:  Head: Normocephalic and atraumatic.  Cardiovascular: Normal rate, regular rhythm and normal heart sounds.   Pulmonary/Chest: Effort normal and breath sounds normal. No respiratory distress. She has no wheezes. She has no rales.  Neurological: She is alert and oriented to person, place, and time.  Skin: Skin is warm and dry.  Psychiatric: She has a normal mood and affect. Her behavior is normal. Thought content normal.  Vitals reviewed.         Assessment & Plan:

## 2016-10-03 NOTE — Assessment & Plan Note (Signed)
Chronic problem.  Well controlled.  Pt will occasionally use Clonazepam as needed.  New controlled substance contract completed and UDS collected.

## 2016-10-03 NOTE — Assessment & Plan Note (Signed)
Chronic problem.  Pt reports she is feeling 'much better' since adjusting medication.  Check labs.  Will continue to follow.

## 2016-10-03 NOTE — Progress Notes (Signed)
Pre visit review using our clinic review tool, if applicable. No additional management support is needed unless otherwise documented below in the visit note. 

## 2016-10-03 NOTE — Patient Instructions (Addendum)
Follow up in 6 months to recheck cholesterol and BP We'll notify you of your lab results and make any changes if needed Continue to take the Sertraline daily and decrease the Clonazepam to as needed for when you feel overwhelmed Continue the good work!  You look great! Call with any questions or concerns Happy Fall!!!

## 2016-10-06 LAB — PAIN MGMT, PROFILE 8 W/CONF, U
6 ACETYLMORPHINE: NEGATIVE ng/mL (ref <10)
ALCOHOL METABOLITES: NEGATIVE ng/mL (ref <500)
ALPHAHYDROXYALPRAZOLAM: NEGATIVE ng/mL (ref <25)
ALPHAHYDROXYTRIAZOLAM: NEGATIVE ng/mL (ref <50)
AMINOCLONAZEPAM: 41 ng/mL — AB (ref <25)
AMPHETAMINES: NEGATIVE ng/mL (ref <500)
Alphahydroxymidazolam: NEGATIVE ng/mL (ref <50)
BUPRENORPHINE, URINE: NEGATIVE ng/mL (ref <5)
Benzodiazepines: POSITIVE ng/mL — AB (ref <100)
CREATININE: 22.9 mg/dL
Cocaine Metabolite: NEGATIVE ng/mL (ref <150)
HYDROXYETHYLFLURAZEPAM: NEGATIVE ng/mL (ref <50)
Lorazepam: NEGATIVE ng/mL (ref <50)
MDA: NEGATIVE ng/mL (ref <200)
MDMA: NEGATIVE ng/mL (ref <200)
MDMA: NEGATIVE ng/mL (ref <500)
Marijuana Metabolite: NEGATIVE ng/mL (ref <20)
NORDIAZEPAM: NEGATIVE ng/mL (ref <50)
OPIATES: NEGATIVE ng/mL (ref <100)
OXAZEPAM: NEGATIVE ng/mL (ref <50)
OXIDANT: NEGATIVE ug/mL (ref <200)
OXYCODONE: NEGATIVE ng/mL (ref <100)
PH: 6.07 (ref 4.5–9.0)
TEMAZEPAM: NEGATIVE ng/mL (ref <50)

## 2016-10-20 DIAGNOSIS — H01004 Unspecified blepharitis left upper eyelid: Secondary | ICD-10-CM | POA: Diagnosis not present

## 2016-10-20 DIAGNOSIS — H04123 Dry eye syndrome of bilateral lacrimal glands: Secondary | ICD-10-CM | POA: Diagnosis not present

## 2016-10-20 DIAGNOSIS — H01001 Unspecified blepharitis right upper eyelid: Secondary | ICD-10-CM | POA: Diagnosis not present

## 2016-10-26 ENCOUNTER — Other Ambulatory Visit: Payer: Self-pay | Admitting: Family Medicine

## 2016-10-26 NOTE — Telephone Encounter (Signed)
Last OV 10/03/16 Clonazepam last filled 03/30/16 #60 with 1

## 2016-10-26 NOTE — Telephone Encounter (Signed)
Medication filled to pharmacy as requested.   

## 2016-12-21 ENCOUNTER — Other Ambulatory Visit: Payer: Self-pay | Admitting: General Practice

## 2016-12-21 MED ORDER — LEVOTHYROXINE SODIUM 75 MCG PO TABS: 75.0000 ug | ORAL_TABLET | Freq: Every day | ORAL | 0 refills | Status: DC

## 2016-12-22 DIAGNOSIS — H401123 Primary open-angle glaucoma, left eye, severe stage: Secondary | ICD-10-CM | POA: Diagnosis not present

## 2016-12-24 ENCOUNTER — Other Ambulatory Visit: Payer: Self-pay | Admitting: Family Medicine

## 2017-01-18 ENCOUNTER — Other Ambulatory Visit: Payer: Self-pay | Admitting: General Practice

## 2017-01-18 MED ORDER — OMEPRAZOLE 20 MG PO CPDR: 20.0000 mg | DELAYED_RELEASE_CAPSULE | Freq: Every day | ORAL | 1 refills | Status: DC

## 2017-01-28 ENCOUNTER — Other Ambulatory Visit: Payer: Self-pay | Admitting: Family Medicine

## 2017-02-01 ENCOUNTER — Other Ambulatory Visit: Payer: Self-pay | Admitting: General Practice

## 2017-02-01 MED ORDER — FENOFIBRATE 160 MG PO TABS: 160.0000 mg | ORAL_TABLET | Freq: Every day | ORAL | 6 refills | Status: DC

## 2017-03-12 ENCOUNTER — Other Ambulatory Visit: Payer: Self-pay | Admitting: Family Medicine

## 2017-03-21 ENCOUNTER — Other Ambulatory Visit: Payer: Self-pay | Admitting: Physician Assistant

## 2017-03-21 NOTE — Telephone Encounter (Signed)
Will defer further refills of patient's medications to PCP  

## 2017-04-03 ENCOUNTER — Ambulatory Visit: Payer: Medicare Other | Admitting: Family Medicine

## 2017-05-02 ENCOUNTER — Other Ambulatory Visit: Payer: Self-pay | Admitting: Physician Assistant

## 2017-06-18 ENCOUNTER — Other Ambulatory Visit: Payer: Self-pay | Admitting: Family Medicine

## 2017-06-24 ENCOUNTER — Other Ambulatory Visit: Payer: Self-pay | Admitting: Family Medicine

## 2017-06-25 ENCOUNTER — Other Ambulatory Visit: Payer: Self-pay | Admitting: Family Medicine

## 2017-06-28 DIAGNOSIS — H04123 Dry eye syndrome of bilateral lacrimal glands: Secondary | ICD-10-CM | POA: Diagnosis not present

## 2017-06-28 DIAGNOSIS — H401123 Primary open-angle glaucoma, left eye, severe stage: Secondary | ICD-10-CM | POA: Diagnosis not present

## 2017-07-03 ENCOUNTER — Ambulatory Visit: Payer: Medicare Other | Admitting: Family Medicine

## 2017-07-04 DIAGNOSIS — H0012 Chalazion right lower eyelid: Secondary | ICD-10-CM | POA: Diagnosis not present

## 2017-07-04 DIAGNOSIS — H04123 Dry eye syndrome of bilateral lacrimal glands: Secondary | ICD-10-CM | POA: Diagnosis not present

## 2017-07-13 ENCOUNTER — Encounter: Payer: Self-pay | Admitting: Family Medicine

## 2017-07-13 ENCOUNTER — Other Ambulatory Visit: Payer: Self-pay

## 2017-07-13 ENCOUNTER — Ambulatory Visit (INDEPENDENT_AMBULATORY_CARE_PROVIDER_SITE_OTHER): Payer: Medicare Other | Admitting: Family Medicine

## 2017-07-13 VITALS — BP 110/70 | HR 80 | Temp 98.0°F | Resp 16 | Ht 67.0 in | Wt 198.5 lb

## 2017-07-13 DIAGNOSIS — E781 Pure hyperglyceridemia: Secondary | ICD-10-CM

## 2017-07-13 DIAGNOSIS — I10 Essential (primary) hypertension: Secondary | ICD-10-CM | POA: Diagnosis not present

## 2017-07-13 DIAGNOSIS — E038 Other specified hypothyroidism: Secondary | ICD-10-CM

## 2017-07-13 LAB — HEPATIC FUNCTION PANEL
ALT: 14 U/L (ref 0–35)
AST: 20 U/L (ref 0–37)
Albumin: 4.3 g/dL (ref 3.5–5.2)
Alkaline Phosphatase: 43 U/L (ref 39–117)
BILIRUBIN DIRECT: 0.1 mg/dL (ref 0.0–0.3)
BILIRUBIN TOTAL: 0.3 mg/dL (ref 0.2–1.2)
Total Protein: 7 g/dL (ref 6.0–8.3)

## 2017-07-13 LAB — CBC WITH DIFFERENTIAL/PLATELET
BASOS PCT: 0.8 % (ref 0.0–3.0)
Basophils Absolute: 0 10*3/uL (ref 0.0–0.1)
EOS ABS: 0.1 10*3/uL (ref 0.0–0.7)
EOS PCT: 2 % (ref 0.0–5.0)
HCT: 37.6 % (ref 36.0–46.0)
Hemoglobin: 12.3 g/dL (ref 12.0–15.0)
Lymphocytes Relative: 21 % (ref 12.0–46.0)
Lymphs Abs: 1.2 10*3/uL (ref 0.7–4.0)
MCHC: 32.8 g/dL (ref 30.0–36.0)
MCV: 85.3 fl (ref 78.0–100.0)
MONO ABS: 0.4 10*3/uL (ref 0.1–1.0)
Monocytes Relative: 7.1 % (ref 3.0–12.0)
NEUTROS ABS: 3.9 10*3/uL (ref 1.4–7.7)
Neutrophils Relative %: 69.1 % (ref 43.0–77.0)
PLATELETS: 221 10*3/uL (ref 150.0–400.0)
RBC: 4.41 Mil/uL (ref 3.87–5.11)
RDW: 15.4 % (ref 11.5–15.5)
WBC: 5.7 10*3/uL (ref 4.0–10.5)

## 2017-07-13 LAB — BASIC METABOLIC PANEL
BUN: 22 mg/dL (ref 6–23)
CHLORIDE: 104 meq/L (ref 96–112)
CO2: 29 meq/L (ref 19–32)
Calcium: 9.7 mg/dL (ref 8.4–10.5)
Creatinine, Ser: 0.99 mg/dL (ref 0.40–1.20)
GFR: 57 mL/min — ABNORMAL LOW (ref >60.00)
Glucose, Bld: 106 mg/dL — ABNORMAL HIGH (ref 70–99)
Potassium: 4.2 mEq/L (ref 3.5–5.1)
SODIUM: 141 meq/L (ref 135–145)

## 2017-07-13 LAB — TSH: TSH: 4.97 u[IU]/mL — AB (ref 0.35–4.50)

## 2017-07-13 LAB — LIPID PANEL
Cholesterol: 197 mg/dL (ref 0–200)
HDL: 34 mg/dL — ABNORMAL LOW (ref >39.00)
NONHDL: 162.85
Total CHOL/HDL Ratio: 6
Triglycerides: 325 mg/dL — ABNORMAL HIGH (ref 0.0–149.0)
VLDL: 65 mg/dL — ABNORMAL HIGH (ref 0.0–40.0)

## 2017-07-13 LAB — LDL CHOLESTEROL, DIRECT: Direct LDL: 126 mg/dL

## 2017-07-13 NOTE — Assessment & Plan Note (Signed)
Chronic problem.  Currently asymptomatic.  Check labs.  Adjust meds prn  

## 2017-07-13 NOTE — Patient Instructions (Addendum)
Schedule your complete physical after 09/15/17 and your Medicare Wellness Visit at your convenience We'll notify you of your lab results and make any changes if needed Keep up the good work on healthy diet and regular exercise- you look great!! Call with any questions or concerns Have a great summer!!

## 2017-07-13 NOTE — Progress Notes (Signed)
   Subjective:    Patient ID: Samantha Briggs, female    DOB: 01/25/1932, 84 y.o.   MRN: [id number]  HPI HTN- chronic problem, on HCTZ 12.5mg , Olmesartan 40mg  daily w/ good control.  Denies CP, SOB, HAs, edema.  Hyperlipidemia- chronic problem, on Fenofibrate 160mg  daily.  Pt is down 10 lbs from last visit and 20 lbs since she started working on it.  Denies abd pain, N/V.  Hypothyroid- chronic problem, on Levothyroxine 75mcg daily.  Denies excessive fatigue, changes to skin/hair/nails.   Review of Systems For ROS see HPI     Objective:   Physical Exam  Constitutional: She is oriented to person, place, and time. She appears well-developed and well-nourished. No distress.  HENT:  Head: Normocephalic and atraumatic.  Eyes: Pupils are equal, round, and reactive to light. Conjunctivae and EOM are normal.  Neck: Normal range of motion. Neck supple. No thyromegaly present.  Cardiovascular: Normal rate, regular rhythm, normal heart sounds and intact distal pulses.  No murmur heard. Pulmonary/Chest: Effort normal and breath sounds normal. No respiratory distress.  Abdominal: Soft. She exhibits no distension. There is no tenderness.  Musculoskeletal: She exhibits no edema.  Lymphadenopathy:    She has no cervical adenopathy.  Neurological: She is alert and oriented to person, place, and time.  Skin: Skin is warm and dry.  Psychiatric: She has a normal mood and affect. Her behavior is normal.  Vitals reviewed.         Assessment & Plan:

## 2017-07-13 NOTE — Assessment & Plan Note (Signed)
Chronic problem.  Tolerating fenofibrate w/o difficulty.  Applauded pt's efforts at diet and exercise- she is down 20 lbs.  Check labs.  Adjust meds prn

## 2017-07-13 NOTE — Assessment & Plan Note (Signed)
Chronic problem.  Excellent control.  Asymptomatic.  Check labs.  No anticipated med changes.  Will follow. 

## 2017-07-14 ENCOUNTER — Other Ambulatory Visit: Payer: Self-pay | Admitting: General Practice

## 2017-07-14 DIAGNOSIS — E038 Other specified hypothyroidism: Secondary | ICD-10-CM

## 2017-07-14 MED ORDER — LEVOTHYROXINE SODIUM 88 MCG PO TABS: 88.0000 ug | ORAL_TABLET | Freq: Every day | ORAL | 3 refills | Status: DC

## 2017-07-27 ENCOUNTER — Other Ambulatory Visit: Payer: Self-pay | Admitting: Family Medicine

## 2017-08-14 ENCOUNTER — Other Ambulatory Visit: Payer: Medicare Other

## 2017-08-22 ENCOUNTER — Other Ambulatory Visit: Payer: Medicare Other

## 2017-08-23 ENCOUNTER — Other Ambulatory Visit: Payer: Medicare Other

## 2017-09-07 ENCOUNTER — Other Ambulatory Visit (INDEPENDENT_AMBULATORY_CARE_PROVIDER_SITE_OTHER): Payer: Medicare Other

## 2017-09-07 DIAGNOSIS — E038 Other specified hypothyroidism: Secondary | ICD-10-CM | POA: Diagnosis not present

## 2017-09-07 DIAGNOSIS — Z23 Encounter for immunization: Secondary | ICD-10-CM

## 2017-09-07 LAB — TSH: TSH: 2.86 u[IU]/mL (ref 0.35–4.50)

## 2017-09-14 ENCOUNTER — Other Ambulatory Visit: Payer: Self-pay | Admitting: Family Medicine

## 2017-09-14 DIAGNOSIS — H04123 Dry eye syndrome of bilateral lacrimal glands: Secondary | ICD-10-CM | POA: Diagnosis not present

## 2017-10-13 ENCOUNTER — Other Ambulatory Visit: Payer: Self-pay | Admitting: Family Medicine

## 2017-11-23 ENCOUNTER — Encounter: Payer: Medicare Other | Admitting: Family Medicine

## 2017-11-23 ENCOUNTER — Ambulatory Visit: Payer: Medicare Other

## 2017-12-19 ENCOUNTER — Other Ambulatory Visit: Payer: Self-pay | Admitting: Family Medicine

## 2018-01-08 ENCOUNTER — Other Ambulatory Visit: Payer: Self-pay

## 2018-01-08 ENCOUNTER — Ambulatory Visit (INDEPENDENT_AMBULATORY_CARE_PROVIDER_SITE_OTHER): Payer: Medicare Other | Admitting: Family Medicine

## 2018-01-08 ENCOUNTER — Encounter: Payer: Self-pay | Admitting: Family Medicine

## 2018-01-08 VITALS — BP 122/70 | HR 92 | Temp 98.3°F | Resp 16 | Ht 67.0 in | Wt 192.8 lb

## 2018-01-08 DIAGNOSIS — J0100 Acute maxillary sinusitis, unspecified: Secondary | ICD-10-CM | POA: Diagnosis not present

## 2018-01-08 MED ORDER — AMOXICILLIN-POT CLAVULANATE 875-125 MG PO TABS: 1.0000 | ORAL_TABLET | Freq: Two times a day (BID) | ORAL | 0 refills | Status: DC

## 2018-01-08 MED ORDER — BENZONATATE 100 MG PO CAPS: 100.0000 mg | ORAL_CAPSULE | Freq: Two times a day (BID) | ORAL | 0 refills | Status: DC | PRN

## 2018-01-08 NOTE — Progress Notes (Signed)
Subjective   CC:  Chief Complaint  Patient presents with  . Cough    Comes and goes started before Christmas.. She has different color sputum.. She has tried mucinex, cough drops and IBU    HPI: Samantha Briggs is a 85 y.o. female who presents to the office today to address the problems listed above in the chief complaint.  Patient reports sinus congestion and pressure with thick drainage, productive cough, ear pressure without pain, and mild malaise.  Symptoms have been present for several weeks.Shedenies high fevers, GI symptoms, shortness of breath. Shehas had sinus infections in the past and this feels similar but cough is worse than usual. Has frontal head pressure and headache. + thick pnd.  Patient is a non-smoker.  No history of asthma or COPD.  I reviewed the patients updated PMH, FH, and SocHx.    Patient Active Problem List   Diagnosis Date Noted  . Hypothyroidism 04/21/2015  . Diarrhea 12/10/2014  . Sciatica of right side 12/02/2014  . Sinusitis, acute maxillary 09/05/2014  . Viral gastroenteritis 02/18/2014  . Anemia, iron deficiency 11/19/2013  . IBS (irritable bowel syndrome) 06/17/2013  . Lumbar back pain 06/17/2013  . GERD (gastroesophageal reflux disease) 06/17/2013  . Hypertriglyceridemia 03/13/2013  . Routine general medical examination at a health care facility 09/19/2012  . Plantar fasciitis 08/17/2012  . Gout 08/17/2012  . Obesity 07/26/2011  . Caregiver stress 07/26/2011  . Epigastric abdominal pain 06/21/2011  . Bloating 06/12/2011  . Post herpetic neuralgia 06/02/2011  . Insomnia 05/18/2011  . Fatigue 12/06/2010  . Dry skin 12/06/2010  . Leg pain 12/06/2010  . HTN (hypertension) 10/17/2010  . Depression with anxiety 10/17/2010  . Glaucoma 10/17/2010   Current Meds  Medication Sig  . aspirin 81 MG tablet Take 81 mg by mouth daily.    . cetirizine (ZYRTEC) 10 MG tablet Take 10 mg by mouth daily.  . dorzolamide (TRUSOPT) 2 % ophthalmic  solution INSTILL ONE DROP INTO LEFT EYE TWICE DAILY  . erythromycin ophthalmic ointment APPLY TO RIGHT LOWER EYELID  . fenofibrate 160 MG tablet TAKE 1 TABLET BY MOUTH EVERY DAY  . ferrous sulfate 325 (65 FE) MG tablet Take 1 tablet (325 mg total) by mouth daily with breakfast.  . fluticasone (FLONASE) 50 MCG/ACT nasal spray PLACE 2 SPRAYS INTO BOTH NOSTRILS DAILY.  . hydrochlorothiazide (HYDRODIURIL) 12.5 MG tablet TAKE 1 TABLET (12.5 MG TOTAL) BY MOUTH DAILY.  Marland Kitchen latanoprost (XALATAN) 0.005 % ophthalmic solution Place 1 drop into the left eye daily.   Marland Kitchen levothyroxine (SYNTHROID, LEVOTHROID) 88 MCG tablet TAKE 1 TABLET BY MOUTH EVERY DAY  . olmesartan (BENICAR) 40 MG tablet TAKE 1 TABLET BY MOUTH EVERY DAY  . omeprazole (PRILOSEC) 20 MG capsule TAKE 1 CAPSULE BY MOUTH EVERY DAY  . PRED MILD 0.12 % ophthalmic suspension Place 1 drop into both eyes.   . Probiotic Product (PROBIOTIC DAILY) CAPS Take by mouth.  . sertraline (ZOLOFT) 50 MG tablet TAKE 1 TABLET BY MOUTH DAILY.  Marland Kitchen XIIDRA 5 % SOLN INSTILL DROPS INTO AFFECTED EYE(S) AS DIRECTED    Review of Systems: Cardiovascular: negative for chest pain Respiratory: negative for SOB or persistent cough Gastrointestinal: negative for abdominal pain Genitourinary: negative for dysuria or gross hematuria  Objective  Vitals: BP 122/70   Pulse 92   Temp 98.3 F (36.8 C) (Oral)   Resp 16   Ht 5\' 7"  (1.702 m)   Wt 192 lb 12.8 oz (87.5 kg)  SpO2 97%   BMI 30.20 kg/m  General: no acute distress  Psych:  Alert and oriented, normal mood and affect HEENT:  Normocephalic, atraumatic, TMs with serous effusions or retraction w/o erythema, nasal mucosa is red with purulent drainage, tender maxillary sinus present, OP mild erythematous w/o eudate, supple neck without LAD Cardiovascular:  RRR with 3/6 murmur Respiratory:  Good breath sounds bilaterally, CTAB with normal respiratory effort Skin:  Warm, no rashes Neurologic:   Mental status is normal.  normal gait  Assessment  1. Acute non-recurrent maxillary sinusitis      Plan    Sinusitis: History and exam is most consistent with bacterial sinus infection.  Etiology and prognosis discussed with patient.  Recommend antibiotics as ordered below.  Patient to complete course of antibiotics, use supportive medications like mucolytics and decongestants as needed.  May use Tylenol or Advil if needed.  Symptoms should improve over the next 2 weeks.  Patient will return or call if symptoms persist or worsen.  Follow up: Return if symptoms worsen or fail to improve.    Commons side effects, risks, benefits, and alternatives for medications and treatment plan prescribed today were discussed, and the patient expressed understanding of the given instructions. Patient is instructed to call or message via MyChart if he/she has any questions or concerns regarding our treatment plan. No barriers to understanding were identified. We discussed Red Flag symptoms and signs in detail. Patient expressed understanding regarding what to do in case of urgent or emergency type symptoms.   Medication list was reconciled, printed and provided to the patient in AVS. Patient instructions and summary information was reviewed with the patient as documented in the AVS. This note was prepared with assistance of Dragon voice recognition software. Occasional wrong-word or sound-a-like substitutions may have occurred due to the inherent limitations of voice recognition software  No orders of the defined types were placed in this encounter.  Meds ordered this encounter  Medications  . amoxicillin-clavulanate (AUGMENTIN) 875-125 MG tablet    Sig: Take 1 tablet by mouth 2 (two) times daily.    Dispense:  14 tablet    Refill:  0  . benzonatate (TESSALON) 100 MG capsule    Sig: Take 1 capsule (100 mg total) by mouth 2 (two) times daily as needed for cough.    Dispense:  20 capsule    Refill:  0

## 2018-01-08 NOTE — Patient Instructions (Addendum)
Please follow up if symptoms do not improve or as needed.   You may use Delsym cough syrup or Mucinex DM to help with congestion and coughing.  Sinusitis, Adult Sinusitis is soreness and swelling (inflammation) of your sinuses. Sinuses are hollow spaces in the bones around your face. They are located:  Around your eyes.  In the middle of your forehead.  Behind your nose.  In your cheekbones. Your sinuses and nasal passages are lined with a fluid called mucus. Mucus drains out of your sinuses. Swelling can trap mucus in your sinuses. This lets germs (bacteria, virus, or fungus) grow, which leads to infection. Most of the time, this condition is caused by a virus. What are the causes? This condition is caused by:  Allergies.  Asthma.  Germs.  Things that block your nose or sinuses.  Growths in the nose (nasal polyps).  Chemicals or irritants in the air.  Fungus (rare). What increases the risk? You are more likely to develop this condition if:  You have a weak body defense system (immune system).  You do a lot of swimming or diving.  You use nasal sprays too much.  You smoke. What are the signs or symptoms? The main symptoms of this condition are pain and a feeling of pressure around the sinuses. Other symptoms include:  Stuffy nose (congestion).  Runny nose (drainage).  Swelling and warmth in the sinuses.  Headache.  Toothache.  A cough that may get worse at night.  Mucus that collects in the throat or the back of the nose (postnasal drip).  Being unable to smell and taste.  Being very tired (fatigue).  A fever.  Sore throat.  Bad breath. How is this diagnosed? This condition is diagnosed based on:  Your symptoms.  Your medical history.  A physical exam.  Tests to find out if your condition is short-term (acute) or long-term (chronic). Your doctor may: ? Check your nose for growths (polyps). ? Check your sinuses using a tool that has a light  (endoscope). ? Check for allergies or germs. ? Do imaging tests, such as an MRI or CT scan. How is this treated? Treatment for this condition depends on the cause and whether it is short-term or long-term.  If caused by a virus, your symptoms should go away on their own within 10 days. You may be given medicines to relieve symptoms. They include: ? Medicines that shrink swollen tissue in the nose. ? Medicines that treat allergies (antihistamines). ? A spray that treats swelling of the nostrils. ? Rinses that help get rid of thick mucus in your nose (nasal saline washes).  If caused by bacteria, your doctor may wait to see if you will get better without treatment. You may be given antibiotic medicine if you have: ? A very bad infection. ? A weak body defense system.  If caused by growths in the nose, you may need to have surgery. Follow these instructions at home: Medicines  Take, use, or apply over-the-counter and prescription medicines only as told by your doctor. These may include nasal sprays.  If you were prescribed an antibiotic medicine, take it as told by your doctor. Do not stop taking the antibiotic even if you start to feel better. Hydrate and humidify   Drink enough water to keep your pee (urine) pale yellow.  Use a cool mist humidifier to keep the humidity level in your home above 50%.  Breathe in steam for 10-15 minutes, 3-4 times a day, or  as told by your doctor. You can do this in the bathroom while a hot shower is running.  Try not to spend time in cool or dry air. Rest  Rest as much as you can.  Sleep with your head raised (elevated).  Make sure you get enough sleep each night. General instructions   Put a warm, moist washcloth on your face 3-4 times a day, or as often as told by your doctor. This will help with discomfort.  Wash your hands often with soap and water. If there is no soap and water, use hand sanitizer.  Do not smoke. Avoid being around  people who are smoking (secondhand smoke).  Keep all follow-up visits as told by your doctor. This is important. Contact a doctor if:  You have a fever.  Your symptoms get worse.  Your symptoms do not get better within 10 days. Get help right away if:  You have a very bad headache.  You cannot stop throwing up (vomiting).  You have very bad pain or swelling around your face or eyes.  You have trouble seeing.  You feel confused.  Your neck is stiff.  You have trouble breathing. Summary  Sinusitis is swelling of your sinuses. Sinuses are hollow spaces in the bones around your face.  This condition is caused by tissues in your nose that become inflamed or swollen. This traps germs. These can lead to infection.  If you were prescribed an antibiotic medicine, take it as told by your doctor. Do not stop taking it even if you start to feel better.  Keep all follow-up visits as told by your doctor. This is important. This information is not intended to replace advice given to you by your health care provider. Make sure you discuss any questions you have with your health care provider. Document Released: 06/08/2007 Document Revised: 05/22/2017 Document Reviewed: 05/22/2017 Elsevier Interactive Patient Education  2019 [employer]

## 2018-01-22 ENCOUNTER — Ambulatory Visit: Payer: Self-pay

## 2018-01-22 NOTE — Telephone Encounter (Signed)
Returned call to patient who states that she was treated for a sinus infection with antibiotic.  She called for some advice because she started to sneeze and an occasional cough yesterday. She was seen in the office on 1/6 and given antibiotic for sinus infection.  She has completed the antibiotic. She denies fever.  She states she had a headache over her eye yesterday but today it is gone.  She is not SOB.  Her nose is runny and the mucus is clear and thick.  She has had no other symptoms. Home care advice read to patient. She verbalized understanding of all instructions.   Reason for Disposition . Cough with cold symptoms (e.g., runny nose, postnasal drip, throat clearing)  Answer Assessment - Initial Assessment Questions 1. ONSET: "When did the cough begin?"      Sunday 19th 2. SEVERITY: "How bad is the cough today?"      Sneezes HA over eye brow, occasional 3. RESPIRATORY DISTRESS: "Describe your breathing."      no 4. FEVER: "Do you have a fever?" If so, ask: "What is your temperature, how was it measured, and when did it start?"     no 5. SPUTUM: "Describe the color of your sputum" (clear, white, yellow, green)     Clear thick 6. HEMOPTYSIS: "Are you coughing up any blood?" If so ask: "How much?" (flecks, streaks, tablespoons, etc.)     no 7. CARDIAC HISTORY: "Do you have any history of heart disease?" (e.g., heart attack, congestive heart failure)      no 8. LUNG HISTORY: "Do you have any history of lung disease?"  (e.g., pulmonary embolus, asthma, emphysema)     no 9. PE RISK FACTORS: "Do you have a history of blood clots?" (or: recent major surgery, recent prolonged travel, bedridden)     no 10. OTHER SYMPTOMS: "Do you have any other symptoms?" (e.g., runny nose, wheezing, chest pain)       sneezy dry can breath through nose 11. PREGNANCY: "Is there any chance you are pregnant?" "When was your last menstrual period?"       N/A 12. TRAVEL: "Have you traveled out of the country in  the last month?" (e.g., travel history, exposures)       no  Protocols used: COUGH - ACUTE PRODUCTIVE-A-AH

## 2018-01-27 ENCOUNTER — Other Ambulatory Visit: Payer: Self-pay | Admitting: Family Medicine

## 2018-02-07 ENCOUNTER — Ambulatory Visit: Payer: Self-pay | Admitting: *Deleted

## 2018-02-07 NOTE — Telephone Encounter (Signed)
Patient thinks she positioned her right foot incorrectly while stepping out of bed this morning. As she points her toes downward she has a mild pain in the arch of the foot. She is able to bare pressure with the foot and walk on it gently without difficulty. Rates the soreness as a 4.  Denies swelling/redness/bruising/numbness and tingling/fever. Reviewed care advice including ice and ibuprofen. She will call back if the pain does not resolve with interventions discussed. Reports she was only calling bc her daughter insisted.  Reason for Disposition . Foot pain  Answer Assessment - Initial Assessment Questions 1. ONSET: "When did the pain start?"     When she stepped out of bed this morning. 2. LOCATION: "Where is the pain located?"      Right foot at the arch area. 3. PAIN: "How bad is the pain?"    (Scale 1-10; or mild, moderate, severe)   -  MILD (1-3): doesn't interfere with normal activities    -  MODERATE (4-7): interferes with normal activities (e.g., work or school) or awakens from sleep, limping    -  SEVERE (8-10): excruciating pain, unable to do any normal activities, unable to walk     4 4. WORK OR EXERCISE: "Has there been any recent work or exercise that involved this part of the body?"      no 5. CAUSE: "What do you think is causing the foot pain?"     Stepped wrong 6. OTHER SYMPTOMS: "Do you have any other symptoms?" (e.g., leg pain, rash, fever, numbness)     none 7. PREGNANCY: "Is there any chance you are pregnant?" "When was your last menstrual period?"    no  Protocols used: FOOT PAIN-A-AH

## 2018-02-09 ENCOUNTER — Other Ambulatory Visit: Payer: Self-pay

## 2018-02-09 ENCOUNTER — Emergency Department (HOSPITAL_BASED_OUTPATIENT_CLINIC_OR_DEPARTMENT_OTHER)
Admission: EM | Admit: 2018-02-09 | Discharge: 2018-02-09 | Disposition: A | Discharge status: Home / Self Care | Payer: Medicare Other | Attending: Emergency Medicine | Admitting: Emergency Medicine

## 2018-02-09 ENCOUNTER — Emergency Department (HOSPITAL_BASED_OUTPATIENT_CLINIC_OR_DEPARTMENT_OTHER): Payer: Medicare Other

## 2018-02-09 ENCOUNTER — Encounter (HOSPITAL_BASED_OUTPATIENT_CLINIC_OR_DEPARTMENT_OTHER): Payer: Self-pay | Admitting: Adult Health

## 2018-02-09 DIAGNOSIS — Z87891 Personal history of nicotine dependence: Secondary | ICD-10-CM | POA: Diagnosis not present

## 2018-02-09 DIAGNOSIS — E039 Hypothyroidism, unspecified: Secondary | ICD-10-CM | POA: Diagnosis not present

## 2018-02-09 DIAGNOSIS — Z79899 Other long term (current) drug therapy: Secondary | ICD-10-CM | POA: Diagnosis not present

## 2018-02-09 DIAGNOSIS — Z7982 Long term (current) use of aspirin: Secondary | ICD-10-CM | POA: Insufficient documentation

## 2018-02-09 DIAGNOSIS — I10 Essential (primary) hypertension: Secondary | ICD-10-CM | POA: Diagnosis not present

## 2018-02-09 DIAGNOSIS — M79671 Pain in right foot: Secondary | ICD-10-CM

## 2018-02-09 MED ORDER — DICLOFENAC SODIUM 1 % TD GEL: 4.0000 g | Freq: Four times a day (QID) | TRANSDERMAL | 0 refills | Status: DC

## 2018-02-09 MED ORDER — ACETAMINOPHEN 500 MG PO TABS
1000.0000 mg | ORAL_TABLET | Freq: Once | ORAL | Status: AC
  Administered 2018-02-09: 1000 mg via ORAL
  Filled 2018-02-09: qty 2

## 2018-02-09 NOTE — ED Triage Notes (Addendum)
PResents with foot pain to right on the medial and lateral aspect of the foot as well as midfoot on the dorsal surface and the top of her foot. She does not think she injured it. Pain ongoing since Tuesday. Pain is worse after a rest period.

## 2018-02-09 NOTE — ED Provider Notes (Signed)
[facility] EMERGENCY DEPARTMENT Provider Note   CSN: [id number] Arrival date & time: 02/09/18  1629     History   Chief Complaint Chief Complaint  Patient presents with  . Foot Pain    HPI Samantha Briggs is a 85 y.o. female.  85 yo F with a chief complaint of right foot pain.  This been going on for the past few days.  Worse when she bears weight.  Usually improves throughout the day and then worsens after a period of rest.  She denies trauma.  Denies fevers or chills.  Denies pain like this before.  The history is provided by the patient.  Foot Pain  This is a new problem. The current episode started 2 days ago. The problem occurs constantly. The problem has been gradually worsening. Pertinent negatives include no chest pain, no headaches and no shortness of breath. The symptoms are aggravated by bending, twisting and walking. Nothing relieves the symptoms. She has tried nothing for the symptoms. The treatment provided no relief.    Past Medical History:  Diagnosis Date  . Allergy   . Anemia   . Depression   . Glaucoma   . Hyperlipidemia   . Hypertension     Patient Active Problem List   Diagnosis Date Noted  . Hypothyroidism 04/21/2015  . Diarrhea 12/10/2014  . Sciatica of right side 12/02/2014  . Sinusitis, acute maxillary 09/05/2014  . Viral gastroenteritis 02/18/2014  . Anemia, iron deficiency 11/19/2013  . IBS (irritable bowel syndrome) 06/17/2013  . Lumbar back pain 06/17/2013  . GERD (gastroesophageal reflux disease) 06/17/2013  . Hypertriglyceridemia 03/13/2013  . Routine general medical examination at a health care facility 09/19/2012  . Plantar fasciitis 08/17/2012  . Gout 08/17/2012  . Obesity 07/26/2011  . Caregiver stress 07/26/2011  . Epigastric abdominal pain 06/21/2011  . Bloating 06/12/2011  . Post herpetic neuralgia 06/02/2011  . Insomnia 05/18/2011  . Fatigue 12/06/2010  . Dry skin 12/06/2010  . Leg pain 12/06/2010  . HTN  (hypertension) 10/17/2010  . Depression with anxiety 10/17/2010  . Glaucoma 10/17/2010    Past Surgical History:  Procedure Laterality Date  . CHOLECYSTECTOMY    . CORNEAL TRANSPLANT     x2  . DILATION AND CURETTAGE OF UTERUS    . RETINAL DETACHMENT SURGERY       OB History   No obstetric history on file.      Home Medications    Prior to Admission medications   Medication Sig Start Date End Date Taking? Authorizing Provider  amoxicillin-clavulanate (AUGMENTIN) 875-125 MG tablet Take 1 tablet by mouth 2 (two) times daily. 01/08/18   Willow OraAndy, Camille L, MD  aspirin 81 MG tablet Take 81 mg by mouth daily.      [provider]  benzonatate (TESSALON) 100 MG capsule Take 1 capsule (100 mg total) by mouth 2 (two) times daily as needed for cough. 01/08/18   Willow OraAndy, Camille L, MD  cetirizine (ZYRTEC) 10 MG tablet Take 10 mg by mouth daily.    [provider]  clonazePAM (KLONOPIN) 0.5 MG tablet TAKE 1 TABLET TWICE A DAY AS NEEDED Patient not taking: Reported on 01/08/2018 10/26/16   Sheliah Hatchabori, Katherine E, MD  diclofenac sodium (VOLTAREN) 1 % GEL Apply 4 g topically 4 (four) times daily. 02/09/18   Melene PlanFloyd, Erskin Zinda, DO  dorzolamide (TRUSOPT) 2 % ophthalmic solution INSTILL ONE DROP INTO LEFT EYE TWICE DAILY 08/29/14   [provider]  erythromycin ophthalmic ointment APPLY TO  RIGHT LOWER EYELID 07/07/17   [provider]  fenofibrate 160 MG tablet TAKE 1 TABLET BY MOUTH EVERY DAY 06/26/17   Sheliah Hatch, MD  ferrous sulfate 325 (65 FE) MG tablet Take 1 tablet (325 mg total) by mouth daily with breakfast. 02/09/16   Sheliah Hatch, MD  fluticasone (FLONASE) 50 MCG/ACT nasal spray PLACE 2 SPRAYS INTO BOTH NOSTRILS DAILY. 05/02/17   Sheliah Hatch, MD  hydrochlorothiazide (HYDRODIURIL) 12.5 MG tablet TAKE 1 TABLET (12.5 MG TOTAL) BY MOUTH DAILY. 01/29/18   Willow Ora, MD  latanoprost (XALATAN) 0.005 % ophthalmic solution Place 1 drop into the left eye daily.   09/13/10   [provider]  levothyroxine (SYNTHROID, LEVOTHROID) 88 MCG tablet TAKE 1 TABLET BY MOUTH EVERY DAY 10/13/17   Sheliah Hatch, MD  olmesartan (BENICAR) 40 MG tablet TAKE 1 TABLET BY MOUTH EVERY DAY 12/19/17   Sheliah Hatch, MD  omeprazole (PRILOSEC) 20 MG capsule TAKE 1 CAPSULE BY MOUTH EVERY DAY 01/29/18   Willow Ora, MD  PRED MILD 0.12 % ophthalmic suspension Place 1 drop into both eyes.  09/19/10   [provider]  Probiotic Product (PROBIOTIC DAILY) CAPS Take by mouth.    [provider]  sertraline (ZOLOFT) 50 MG tablet TAKE 1 TABLET BY MOUTH DAILY. 09/14/17   Sheliah Hatch, MD  XIIDRA 5 % SOLN INSTILL DROPS INTO AFFECTED EYE(S) AS DIRECTED 07/07/17   [provider]    Family History Family History  Problem Relation Age of Onset  . Cancer Mother   . Breast cancer Mother   . Cancer Father   . Pancreatic cancer Father   . Diabetes Sister     Social History Social History   Tobacco Use  . Smoking status: Former Smoker    Types: Cigarettes    Last attempt to quit: 01/04/1972    Years since quitting: 46.1  . Smokeless tobacco: Never Used  Substance Use Topics  . Alcohol use: No  . Drug use: No     Allergies   Morphine and related and Neurontin [gabapentin]   Review of Systems Review of Systems  Constitutional: Negative for chills and fever.  HENT: Negative for congestion and rhinorrhea.   Eyes: Negative for redness and visual disturbance.  Respiratory: Negative for shortness of breath and wheezing.   Cardiovascular: Negative for chest pain and palpitations.  Gastrointestinal: Negative for nausea and vomiting.  Genitourinary: Negative for dysuria and urgency.  Musculoskeletal: Positive for arthralgias and gait problem. Negative for myalgias.  Skin: Negative for pallor and wound.  Neurological: Negative for dizziness and headaches.     Physical Exam Updated Vital Signs BP (!) 182/80 (BP Location:  Right Arm)   Pulse (!) 102   Temp 98.3 F (36.8 C) (Oral)   Resp 18   Ht 5\' 7"  (1.702 m)   Wt 88 kg   SpO2 100%   BMI 30.38 kg/m   Physical Exam Vitals signs and nursing note reviewed.  Constitutional:      General: She is not in acute distress.    Appearance: She is well-developed. She is not diaphoretic.  HENT:     Head: Normocephalic and atraumatic.  Eyes:     Pupils: Pupils are equal, round, and reactive to light.  Neck:     Musculoskeletal: Normal range of motion and neck supple.  Cardiovascular:     Rate and Rhythm: Normal rate and regular rhythm.     Heart sounds:  No murmur. No friction rub. No gallop.   Pulmonary:     Effort: Pulmonary effort is normal.     Breath sounds: No wheezing or rales.  Abdominal:     General: There is no distension.     Palpations: Abdomen is soft.     Tenderness: There is no abdominal tenderness.  Musculoskeletal:        General: Tenderness present.     Comments: Tenderness at the base of the fifth metatarsal.  No significant swelling.  No crepitus.  Pulse motor and sensation is intact.  No pain about the lateral or medial malleolus.  Skin:    General: Skin is warm and dry.  Neurological:     Mental Status: She is alert and oriented to person, place, and time.  Psychiatric:        Behavior: Behavior normal.      ED Treatments / Results  Labs (all labs ordered are listed, but only abnormal results are displayed) Labs Reviewed - No data to display  EKG None  Radiology Dg Foot Complete Right  Result Date: 02/09/2018 CLINICAL DATA:  Right foot pain EXAM: RIGHT FOOT COMPLETE - 3+ VIEW COMPARISON:  None. FINDINGS: No fracture or dislocation is seen. Mild degenerative changes the 1st MTP joint. Small plantar calcaneal enthesophyte. The visualized soft tissues are unremarkable. IMPRESSION: Negative. Electronically Signed   By: Charline BillsSriyesh  Krishnan M.D.   On: 02/09/2018 17:29    Procedures Procedures (including critical care  time)  Medications Ordered in ED Medications  acetaminophen (TYLENOL) tablet 1,000 mg (1,000 mg Oral Given 02/09/18 1708)     Initial Impression / Assessment and Plan / ED Course  I have reviewed the triage vital signs and the nursing notes.  Pertinent labs & imaging results that were available during my care of the patient were reviewed by me and considered in my medical decision making (see chart for details).     85 yo F with a chief complaint of right foot pain.  Patient has pain at the base of the fifth metatarsal.  Will obtain a plain film to evaluate for fracture.  Most likely this sounds like plantar fasciitis based on history.  Plain films viewed by me is negative for acute fracture.  Will place in a postop shoe.  Have her follow-up with her family doctor.  5:55 PM: = I have discussed the diagnosis/risks/treatment options with the patient and family and believe the pt to be eligible for discharge home to follow-up with PCP. We also discussed returning to the ED immediately if new or worsening sx occur. We discussed the sx which are most concerning (e.g., sudden worsening pain, fever, inability to tolerate by mouth) that necessitate immediate return. Medications administered to the patient during their visit and any new prescriptions provided to the patient are listed below.  Medications given during this visit Medications  acetaminophen (TYLENOL) tablet 1,000 mg (1,000 mg Oral Given 02/09/18 1708)     The patient appears reasonably screen and/or stabilized for discharge and I doubt any other medical condition or other [facility] requiring further screening, evaluation, or treatment in the ED at this time prior to discharge.    Final Clinical Impressions(s) / ED Diagnoses   Final diagnoses:  Foot pain, right    ED Discharge Orders         Ordered    diclofenac sodium (VOLTAREN) 1 % GEL  4 times daily     02/09/18 1738  Melene Plan, DO 02/09/18 1755

## 2018-02-09 NOTE — Discharge Instructions (Signed)
Follow up with your family doc.   Take tylenol 1000mg (2 extra strength) four times a day.

## 2018-02-12 ENCOUNTER — Ambulatory Visit: Payer: Self-pay | Admitting: *Deleted

## 2018-02-12 NOTE — Telephone Encounter (Signed)
Attempted to contact pt; no answer at [phone].

## 2018-02-12 NOTE — Telephone Encounter (Signed)
Seen in ED for plantar fasciitis. Has a boot in place.States it is not very comfortable. Also taking Tylenol. States pain is better. Requests an appointment for follow up.Will see either Dr. Beverely Low or Dr. Mardelle Matte. Requests certain times due to daughter's work schedule. Appointment made.

## 2018-02-13 ENCOUNTER — Ambulatory Visit (INDEPENDENT_AMBULATORY_CARE_PROVIDER_SITE_OTHER): Payer: Medicare Other | Admitting: Family Medicine

## 2018-02-13 ENCOUNTER — Other Ambulatory Visit: Payer: Self-pay

## 2018-02-13 ENCOUNTER — Encounter: Payer: Self-pay | Admitting: Family Medicine

## 2018-02-13 VITALS — BP 152/74 | HR 77 | Temp 99.0°F | Resp 16 | Ht 67.0 in | Wt 193.0 lb

## 2018-02-13 DIAGNOSIS — M10071 Idiopathic gout, right ankle and foot: Secondary | ICD-10-CM

## 2018-02-13 MED ORDER — PREDNISONE 10 MG PO TABS: ORAL_TABLET | ORAL | 0 refills | Status: DC

## 2018-02-13 NOTE — Progress Notes (Signed)
Subjective  CC:  Chief Complaint  Patient presents with  . Follow-up    ED on 02/09/18 for right foot pain.. Reports that has  and is more in the great toe    HPI: Samantha Briggs is a 85 y.o. female who presents to the office today to address the problems listed above in the chief complaint.  85 yo here with daughter for f/u of right foot pain evaluated in ED 2/7; notes and xray reviewed. Pt reports awoke 2/6 with acute mid foot pain although she is vague where it started. Pain worsened over 24 hours making it hard for her to walk. She denies heel pain. ER dxd plantar fasciitis and put her in post op shoe. Since, pain has progressed, great toe hurts with movement and at night interferes with sleep; even covers hurt foot. Has swelling over mid-foot. No heel or ankle pain. Has been difficult to walk in post op shoe. No knee pain. No injuries. Taking tylenol with minimal relief.   HTN has been well controlled but up today. Pt is worried about that as well. No cp.    Assessment  1. Acute idiopathic gout of right foot      Plan   History and exam most c/w acute gout of foot, great toe:  rec ice, pred taper, stop post op shoe, cane for assisted ambulation and f/u in 3-5 days if not improving.   Will rec uric acid levels with next routine blood work. Last flare was many years ago.   Hydrate. Reassured, suspect elevated bp related to pain and poor sleep; will recheck at home once she feels better  Follow up: Return if symptoms worsen or fail to improve.  03/26/2018  No orders of the defined types were placed in this encounter.  Meds ordered this encounter  Medications  . predniSONE (DELTASONE) 10 MG tablet    Sig: Take 4 tabs qd x 2 days, 3 qd x 2 days, 2 qd x 2d, 1qd x 3 days    Dispense:  21 tablet    Refill:  0      I reviewed the patients updated PMH, FH, and SocHx.    Patient Active Problem List   Diagnosis Date Noted  . Hypothyroidism 04/21/2015  . Diarrhea 12/10/2014   . Sciatica of right side 12/02/2014  . Sinusitis, acute maxillary 09/05/2014  . Viral gastroenteritis 02/18/2014  . Anemia, iron deficiency 11/19/2013  . IBS (irritable bowel syndrome) 06/17/2013  . Lumbar back pain 06/17/2013  . GERD (gastroesophageal reflux disease) 06/17/2013  . Hypertriglyceridemia 03/13/2013  . Routine general medical examination at a health care facility 09/19/2012  . Plantar fasciitis 08/17/2012  . Gout 08/17/2012  . Obesity 07/26/2011  . Caregiver stress 07/26/2011  . Epigastric abdominal pain 06/21/2011  . Bloating 06/12/2011  . Post herpetic neuralgia 06/02/2011  . Insomnia 05/18/2011  . Fatigue 12/06/2010  . Dry skin 12/06/2010  . Leg pain 12/06/2010  . HTN (hypertension) 10/17/2010  . Depression with anxiety 10/17/2010  . Glaucoma 10/17/2010   No outpatient medications have been marked as taking for the 02/13/18 encounter (Office Visit) with Willow Ora, MD.    Allergies: Patient is allergic to morphine and related and neurontin [gabapentin]. Family History: Patient family history includes Breast cancer in her mother; Cancer in her father and mother; Diabetes in her sister; Pancreatic cancer in her father. Social History:  Patient  reports that she quit smoking about 46 years ago. Her smoking use included  cigarettes. She has never used smokeless tobacco. She reports that she does not drink alcohol or use drugs.  Review of Systems: Constitutional: Negative for fever malaise or anorexia Cardiovascular: negative for chest pain Respiratory: negative for SOB or persistent cough Gastrointestinal: negative for abdominal pain  Objective  Vitals: BP (!) 152/74   Pulse 77   Temp 99 F (37.2 C) (Oral)   Resp 16   Ht 5\' 7"  (1.702 m)   Wt 193 lb (87.5 kg)   SpO2 97%   BMI 30.23 kg/m  General: no acute distress , A&Ox3 HEENT: PEERL, conjunctiva normal, Oropharynx moist,neck is supple Cardiovascular:  RRR without murmur or gallop.  Respiratory:   Good breath sounds bilaterally, CTAB with normal respiratory effort Skin:  Warm, no rashes Right foot: dorsal swelling and very tender great 1st MTP; nl ankle. nontender heel. Antalgic gait with limp     Commons side effects, risks, benefits, and alternatives for medications and treatment plan prescribed today were discussed, and the patient expressed understanding of the given instructions. Patient is instructed to call or message via MyChart if he/she has any questions or concerns regarding our treatment plan. No barriers to understanding were identified. We discussed Red Flag symptoms and signs in detail. Patient expressed understanding regarding what to do in case of urgent or emergency type symptoms.   Medication list was reconciled, printed and provided to the patient in AVS. Patient instructions and summary information was reviewed with the patient as documented in the AVS. This note was prepared with assistance of Dragon voice recognition software. Occasional wrong-word or sound-a-like substitutions may have occurred due to the inherent limitations of voice recognition software

## 2018-02-13 NOTE — Patient Instructions (Signed)
Please follow up if symptoms do not improve or as needed.   Take the prednisone as directed.    Gout  Gout is a condition that causes painful swelling of the joints. Gout is a type of inflammation of the joints (arthritis). This condition is caused by having too much uric acid in the body. Uric acid is a chemical that forms when the body breaks down substances called purines. Purines are important for building body proteins. When the body has too much uric acid, sharp crystals can form and build up inside the joints. This causes pain and swelling. Gout attacks can happen quickly and may be very painful (acute gout). Over time, the attacks can affect more joints and become more frequent (chronic gout). Gout can also cause uric acid to build up under the skin and inside the kidneys. What are the causes? This condition is caused by too much uric acid in your blood. This can happen because:  Your kidneys do not remove enough uric acid from your blood. This is the most common cause.  Your body makes too much uric acid. This can happen with some cancers and cancer treatments. It can also occur if your body is breaking down too many red blood cells (hemolytic anemia).  You eat too many foods that are high in purines. These foods include organ meats and some seafood. Alcohol, especially beer, is also high in purines. A gout attack may be triggered by trauma or stress. What increases the risk? You are more likely to develop this condition if you:  Have a family history of gout.  Are female and middle-aged.  Are female and have gone through menopause.  Are obese.  Frequently drink alcohol, especially beer.  Are dehydrated.  Lose weight too quickly.  Have an organ transplant.  Have lead poisoning.  Take certain medicines, including aspirin, cyclosporine, diuretics, levodopa, and niacin.  Have kidney disease.  Have a skin condition called psoriasis. What are the signs or symptoms? An  attack of acute gout happens quickly. It usually occurs in just one joint. The most common place is the big toe. Attacks often start at night. Other joints that may be affected include joints of the feet, ankle, knee, fingers, wrist, or elbow. Symptoms of this condition may include:  Severe pain.  Warmth.  Swelling.  Stiffness.  Tenderness. The affected joint may be very painful to touch.  Shiny, red, or purple skin.  Chills and fever. Chronic gout may cause symptoms more frequently. More joints may be involved. You may also have white or yellow lumps (tophi) on your hands or feet or in other areas near your joints. How is this diagnosed? This condition is diagnosed based on your symptoms, medical history, and physical exam. You may have tests, such as:  Blood tests to measure uric acid levels.  Removal of joint fluid with a thin needle (aspiration) to look for uric acid crystals.  X-rays to look for joint damage. How is this treated? Treatment for this condition has two phases: treating an acute attack and preventing future attacks. Acute gout treatment may include medicines to reduce pain and swelling, including:  NSAIDs.  Steroids. These are strong anti-inflammatory medicines that can be taken by mouth (orally) or injected into a joint.  Colchicine. This medicine relieves pain and swelling when it is taken soon after an attack. It can be given by mouth or through an IV. Preventive treatment may include:  Daily use of smaller doses of NSAIDs or  colchicine.  Use of a medicine that reduces uric acid levels in your blood.  Changes to your diet. You may need to see a dietitian about what to eat and drink to prevent gout. Follow these instructions at home: During a gout attack   If directed, put ice on the affected area: ? Put ice in a plastic bag. ? Place a towel between your skin and the bag. ? Leave the ice on for 20 minutes, 2-3 times a day.  Raise (elevate) the  affected joint above the level of your heart as often as possible.  Rest the joint as much as possible. If the affected joint is in your leg, you may be given crutches to use.  Follow instructions from your health care provider about eating or drinking restrictions. Avoiding future gout attacks  Follow a low-purine diet as told by your dietitian or health care provider. Avoid foods and drinks that are high in purines, including liver, kidney, anchovies, asparagus, herring, mushrooms, mussels, and beer.  Maintain a healthy weight or lose weight if you are overweight. If you want to lose weight, talk with your health care provider. It is important that you do not lose weight too quickly.  Start or maintain an exercise program as told by your health care provider. Eating and drinking  Drink enough fluids to keep your urine pale yellow.  If you drink alcohol: ? Limit how much you use to:  0-1 drink a day for women.  0-2 drinks a day for men. ? Be aware of how much alcohol is in your drink. In the [place] one drink equals one 12 oz bottle of beer (355 mL) one 5 oz glass of wine (148 mL), or one 1 oz glass of hard liquor (44 mL). General instructions  Take over-the-counter and prescription medicines only as told by your health care provider.  Do not drive or use heavy machinery while taking prescription pain medicine.  Return to your normal activities as told by your health care provider. Ask your health care provider what activities are safe for you.  Keep all follow-up visits as told by your health care provider. This is important. Contact a health care provider if you have:  Another gout attack.  Continuing symptoms of a gout attack after 10 days of treatment.  Side effects from your medicines.  Chills or a fever.  Burning pain when you urinate.  Pain in your lower back or belly. Get help right away if you:  Have severe or uncontrolled pain.  Cannot  urinate. Summary  Gout is painful swelling of the joints caused by inflammation.  The most common site of pain is the big toe, but it can affect other joints in the body.  Medicines and dietary changes can help to prevent and treat gout attacks. This information is not intended to replace advice given to you by your health care provider. Make sure you discuss any questions you have with your health care provider. Document Released: 12/18/1999 Document Revised: 07/12/2017 Document Reviewed: 07/12/2017 Elsevier Interactive Patient Education  2019 [employer].

## 2018-02-22 DIAGNOSIS — H04123 Dry eye syndrome of bilateral lacrimal glands: Secondary | ICD-10-CM | POA: Diagnosis not present

## 2018-02-22 DIAGNOSIS — H401122 Primary open-angle glaucoma, left eye, moderate stage: Secondary | ICD-10-CM | POA: Diagnosis not present

## 2018-02-22 DIAGNOSIS — Z961 Presence of intraocular lens: Secondary | ICD-10-CM | POA: Diagnosis not present

## 2018-03-16 ENCOUNTER — Other Ambulatory Visit: Payer: Self-pay | Admitting: Family Medicine

## 2018-03-26 ENCOUNTER — Encounter: Payer: Medicare Other | Admitting: Family Medicine

## 2018-03-30 ENCOUNTER — Encounter: Payer: Self-pay | Admitting: Family Medicine

## 2018-03-30 ENCOUNTER — Ambulatory Visit (INDEPENDENT_AMBULATORY_CARE_PROVIDER_SITE_OTHER): Payer: Medicare Other | Admitting: Family Medicine

## 2018-03-30 VITALS — Temp 97.9°F | Ht 67.0 in | Wt 183.0 lb

## 2018-03-30 DIAGNOSIS — R300 Dysuria: Secondary | ICD-10-CM | POA: Diagnosis not present

## 2018-03-30 MED ORDER — CEPHALEXIN 500 MG PO CAPS: 500.0000 mg | ORAL_CAPSULE | Freq: Two times a day (BID) | ORAL | 0 refills | Status: AC

## 2018-03-30 NOTE — Progress Notes (Signed)
Virtual Visit via Video Note  I connected with [name] on 03/30/18 at 11:00 AM EDT by a video enabled telemedicine application and verified that I am speaking with the correct person using two identifiers.   I discussed the limitations of evaluation and management by telemedicine and the availability of in person appointments. The patient expressed understanding and agreed to proceed.  Pt is at home w/ daughter, I am in office  Interactive audio and video telecommunications were attempted between this provider and patient, however failed, due to patient having technical difficulties OR patient did not have access to video capability.  We continued and completed visit with audio only.   History of Present Illness: Dysuria- sxs started ~2 days ago.  Pt reports increased frequency, increased burning w/ urination.  No blood in urine.  No odor or cloudiness.  No suprapubic.  No fevers.   Observations/Objective: Pt is able to speak clearly, coherently without shortness of breath or increased work of breathing.   Thought process is linear.  Mood is appropriate.  Pt denies TTP over suprapubic area or CVA tenderness (daughter assessed)  Assessment and Plan: Suspected UTI- start abx.  If no improvement, pt will need to do a UA and culture or schedule an appt for a vaginal exam.  Pt expressed understanding and is in agreement w/ plan.   Follow Up Instructions: PRN   I discussed the assessment and treatment plan with the patient. The patient was provided an opportunity to ask questions and all were answered. The patient agreed with the plan and demonstrated an understanding of the instructions.   The patient was advised to call back or seek an in-person evaluation if the symptoms worsen or if the condition fails to improve as anticipated.  I provided 9 minutes of non-face-to-face time during this encounter.   Neena Rhymes, MD

## 2018-03-30 NOTE — Progress Notes (Signed)
I have discussed the procedure for the virtual visit with the patient who has given consent to proceed with assessment and treatment.   Myan Suit, CMA     

## 2018-04-14 ENCOUNTER — Other Ambulatory Visit: Payer: Self-pay | Admitting: Family Medicine

## 2018-04-19 ENCOUNTER — Other Ambulatory Visit: Payer: Self-pay | Admitting: Family Medicine

## 2018-04-19 MED ORDER — OMEPRAZOLE 20 MG PO CPDR: DELAYED_RELEASE_CAPSULE | ORAL | 0 refills | Status: DC

## 2018-04-19 MED ORDER — SERTRALINE HCL 50 MG PO TABS: 50.0000 mg | ORAL_TABLET | Freq: Every day | ORAL | 0 refills | Status: DC

## 2018-04-19 MED ORDER — FENOFIBRATE 160 MG PO TABS: 160.0000 mg | ORAL_TABLET | Freq: Every day | ORAL | 0 refills | Status: DC

## 2018-04-19 MED ORDER — HYDROCHLOROTHIAZIDE 12.5 MG PO TABS: ORAL_TABLET | ORAL | 0 refills | Status: DC

## 2018-06-10 ENCOUNTER — Other Ambulatory Visit: Payer: Self-pay | Admitting: Family Medicine

## 2018-06-14 ENCOUNTER — Telehealth: Payer: Self-pay | Admitting: Family Medicine

## 2018-06-14 ENCOUNTER — Other Ambulatory Visit: Payer: Self-pay | Admitting: Family Medicine

## 2018-06-14 MED ORDER — FENOFIBRATE 160 MG PO TABS: 160.0000 mg | ORAL_TABLET | Freq: Every day | ORAL | 2 refills | Status: DC

## 2018-06-14 MED ORDER — FENOFIBRATE 160 MG PO TABS: 160.0000 mg | ORAL_TABLET | Freq: Every day | ORAL | 1 refills | Status: DC

## 2018-06-14 NOTE — Telephone Encounter (Signed)
Medication filled to pharmacy as requested.   

## 2018-06-14 NOTE — Telephone Encounter (Signed)
Copied from [place] [id number]. Topic: Quick Communication - Rx Refill/Question >> Jun 14, 2018  9:38 AM Erick Blinks wrote: Medication: fenofibrate 160 MG tablet [[id number]]   Has the patient contacted their pharmacy? Yes  (Agent: If no, request that the patient contact the pharmacy for the refill.) (Agent: If yes, when and what did the pharmacy advise?)  Preferred Pharmacy (with phone number or street name): [facility] [address] [place] [address] Phone: [phone] Fax: [phone]    Agent: Please be advised that RX refills may take up to 3 business days. We ask that you follow-up with your pharmacy.

## 2018-06-22 ENCOUNTER — Other Ambulatory Visit: Payer: Self-pay | Admitting: Family Medicine

## 2018-06-25 ENCOUNTER — Ambulatory Visit: Payer: Self-pay | Admitting: *Deleted

## 2018-06-25 NOTE — Telephone Encounter (Signed)
Summary: on and off dizziness/sweats/weight loss   Patient's daughter called in stating that patient has been experiencing on and off dizzy/lightheaded spells for 2 weeks. Daughter states the patient has also been experiencing sweats with no fever for the same amount of time. States she does not lose balance or need to grab anything to remain stable. Patient recently moved in with daughter when these changes started happening. Daughter would also like to make aware patient has lost close to 30 lbs since Christmas. Patient's daughter has checked BP and fever, both read normal. Requesting advice to see if it is her medication or if it is something else. Please advise. Call back is [phone].          Returned call to pt and daughter regarding having dizziness or being lightheaded for the past 2 weeks. This is only when she is attempting to stand up. She is active. She has lost about 30 lbs since Christmas and wonders if she needs some readjustment on her medication. She is requesting an appointment. Advised that it may be a virtual appointment. Will route triage to [facility] at [facility] for an review and appointment. Advised to call back if lightheadedness increases, also shortness of breath, weakness or chest pain. She voiced understanding.  Reason for Disposition . [1] MILD dizziness (e.g., walking normally) AND [2] has NOT been evaluated by physician for this  (Exception: dizziness caused by heat exposure, sudden standing, or poor fluid intake)  Answer Assessment - Initial Assessment Questions 1. DESCRIPTION: "Describe your dizziness."     lightheaded 2. LIGHTHEADED: "Do you feel lightheaded?" (e.g., somewhat faint, woozy, weak upon standing)     At times 3. VERTIGO: "Do you feel like either you or the room is spinning or tilting?" (i.e. vertigo)     no 4. SEVERITY: "How bad is it?"  "Do you feel like you are going to faint?" "Can you stand and walk?"   - MILD - walking normally   -  MODERATE - interferes with normal activities (e.g., work, school)    - SEVERE - unable to stand, requires support to walk, feels like passing out now.      mild 5. ONSET:  "When did the dizziness begin?"     About 2 weeks ago 6. AGGRAVATING FACTORS: "Does anything make it worse?" (e.g., standing, change in head position)     Getting up real quick 7. HEART RATE: "Can you tell me your heart rate?" "How many beats in 15 seconds?"  (Note: not all patients can do this)       HR 88 just coming up a flight of steps 8. CAUSE: "What do you think is causing the dizziness?"     Maybe medication 9. RECURRENT SYMPTOM: "Have you had dizziness before?" If so, ask: "When was the last time?" "What happened that time?"     no 10. OTHER SYMPTOMS: "Do you have any other symptoms?" (e.g., fever, chest pain, vomiting, diarrhea, bleeding)       no 11. PREGNANCY: "Is there any chance you are pregnant?" "When was your last menstrual period?"       n/a  Protocols used: DIZZINESS [facility]

## 2018-06-26 NOTE — Telephone Encounter (Signed)
Please schedule in office

## 2018-06-26 NOTE — Telephone Encounter (Signed)
Pt needs appt to assess her dizziness

## 2018-06-26 NOTE — Telephone Encounter (Signed)
Pt has been scheduled for Thursday per her request, she was advised to call in her symptoms worsen.

## 2018-06-26 NOTE — Telephone Encounter (Signed)
Please advise 

## 2018-06-28 ENCOUNTER — Encounter: Payer: Self-pay | Admitting: Family Medicine

## 2018-06-28 ENCOUNTER — Other Ambulatory Visit: Payer: Self-pay

## 2018-06-28 ENCOUNTER — Ambulatory Visit (INDEPENDENT_AMBULATORY_CARE_PROVIDER_SITE_OTHER): Payer: Medicare Other | Admitting: Family Medicine

## 2018-06-28 VITALS — BP 138/89 | HR 68 | Temp 98.0°F | Resp 16 | Ht 67.0 in | Wt 180.2 lb

## 2018-06-28 DIAGNOSIS — E663 Overweight: Secondary | ICD-10-CM | POA: Diagnosis not present

## 2018-06-28 DIAGNOSIS — F418 Other specified anxiety disorders: Secondary | ICD-10-CM

## 2018-06-28 DIAGNOSIS — R42 Dizziness and giddiness: Secondary | ICD-10-CM | POA: Diagnosis not present

## 2018-06-28 MED ORDER — SERTRALINE HCL 100 MG PO TABS: 100.0000 mg | ORAL_TABLET | Freq: Every day | ORAL | 3 refills | Status: DC

## 2018-06-28 MED ORDER — FLUTICASONE PROPIONATE 50 MCG/ACT NA SUSP: 2.0000 | Freq: Every day | NASAL | 5 refills | Status: DC

## 2018-06-28 NOTE — Progress Notes (Signed)
Subjective:    Patient ID: Samantha Briggs, female    DOB: Jul 04, 1932, 85 y.o.   MRN: [id number]  HPI Dizziness- pt reports this occurs when she 'bend over and pop back up again'.  Denies falling.  Reports dizziness only occurs when bending over  Weight loss- pt is down nearly 30 lbs since September.  Pt was working quite hard on weight loss but family just noticed b/c she now lives with them.  She had eliminated snacking.  Is eating very healthy since moving in w/ her daughter (who is very health conscious).  Is more active due to playing w/ great grandkids- increased walking.  Adjustment disorder- she is having a hard time since moving in with her daughter.  She is feeling the loss of her independence.  Pt has increased anxiety due to a 'complete change in lifestyle'.  On Sertraline 50mg  daily.  Pt wants to keep her drivers license.   Review of Systems For ROS see HPI     Objective:   Physical Exam Vitals signs reviewed.  Constitutional:      General: She is not in acute distress.    Appearance: She is well-developed.  HENT:     Head: Normocephalic and atraumatic.  Eyes:     Conjunctiva/sclera: Conjunctivae normal.     Pupils: Pupils are equal, round, and reactive to light.  Neck:     Musculoskeletal: Normal range of motion and neck supple.     Thyroid: No thyromegaly.  Cardiovascular:     Rate and Rhythm: Normal rate and regular rhythm.     Heart sounds: Normal heart sounds. No murmur.  Pulmonary:     Effort: Pulmonary effort is normal. No respiratory distress.     Breath sounds: Normal breath sounds.  Abdominal:     General: There is no distension.     Palpations: Abdomen is soft.     Tenderness: There is no abdominal tenderness.  Lymphadenopathy:     Cervical: No cervical adenopathy.  Skin:    General: Skin is warm and dry.  Neurological:     Mental Status: She is alert and oriented to person, place, and time.  Psychiatric:        Behavior: Behavior normal.         Thought Content: Thought content normal.        Judgment: Judgment normal.           Assessment & Plan:  Dizziness- new.  Occurs only w/ bending over and standing up quickly.  Instructed her not to do this and to bend at the knees if possible.  If bending at the knees is not possible, then she needs to stand up slowly.  Pt expressed understanding and is in agreement w/ plan.   Overweight- pt has lost 30 lbs since September but this has been intentional and something she has worked on.  There is no cause for alarm and pt is actually interested in losing more weight.  Applauded her efforts.  Will follow.  Anxiety/depression- deteriorated in the setting of an adjustment disorder since moving in w/ daughter and her family.  She feels that they are smothering her.  She is used to doing everything independently and at this time, she is still able to.  She is still able to drive and exercise and make choices for herself.  Encouraged her to open the lines of communication and set boundaries.  Will increase Sertraline at this time as she feels very on edge.  Will follow.

## 2018-06-28 NOTE — Patient Instructions (Addendum)
Schedule your complete physical at your convenience Make sure you are drinking plenty of fluids DO NOT pop up after bending over- this is the cause of your dizziness I am PROUD of your weight loss.  I know you've been working hard and it shows! There is no reason to stop driving.  It's ok to go to the store and [employer]. You do not medically qualify for a handicapped placard- thank goodness! It is ok to be impacted by this change and to set boundaries while finding your new normal INCREASE the Sertraline to 100mg .  2 of what you have at home and 1 of the new prescription Check your allergy medication- if you are on Loratidine (Claritin) switch to Cetirizine (Zyrtec) or vice versa Call with any questions or concerns Hang in there!!!  Stay Safe!!!

## 2018-07-26 ENCOUNTER — Other Ambulatory Visit: Payer: Self-pay | Admitting: Family Medicine

## 2018-08-23 DIAGNOSIS — H401122 Primary open-angle glaucoma, left eye, moderate stage: Secondary | ICD-10-CM | POA: Diagnosis not present

## 2018-08-23 DIAGNOSIS — H04123 Dry eye syndrome of bilateral lacrimal glands: Secondary | ICD-10-CM | POA: Diagnosis not present

## 2018-08-29 DIAGNOSIS — H00011 Hordeolum externum right upper eyelid: Secondary | ICD-10-CM | POA: Diagnosis not present

## 2018-09-06 ENCOUNTER — Encounter: Payer: Self-pay | Admitting: Family Medicine

## 2018-09-06 ENCOUNTER — Encounter: Payer: Medicare Other | Admitting: Family Medicine

## 2018-09-06 ENCOUNTER — Ambulatory Visit (INDEPENDENT_AMBULATORY_CARE_PROVIDER_SITE_OTHER): Payer: Medicare Other | Admitting: Family Medicine

## 2018-09-06 ENCOUNTER — Other Ambulatory Visit: Payer: Self-pay

## 2018-09-06 VITALS — BP 123/78 | HR 80 | Temp 97.9°F | Resp 16 | Ht 67.0 in | Wt 174.5 lb

## 2018-09-06 DIAGNOSIS — I10 Essential (primary) hypertension: Secondary | ICD-10-CM

## 2018-09-06 DIAGNOSIS — Z0000 Encounter for general adult medical examination without abnormal findings: Secondary | ICD-10-CM

## 2018-09-06 DIAGNOSIS — Z23 Encounter for immunization: Secondary | ICD-10-CM

## 2018-09-06 NOTE — Patient Instructions (Addendum)
Follow up in 6 months to recheck BP and cholesterol We'll notify you of your lab results and make any changes if needed Keep up the good work on healthy diet and regular exercise- you look great! Call with any questions or concerns Stay Safe!!!

## 2018-09-06 NOTE — Progress Notes (Signed)
   Subjective:    Patient ID: Samantha Briggs, female    DOB: 02/08/1932, 85 y.o.   MRN: [id number]  HPI Here today for CPE.  Risk Factors: HTN- chronic problem, on HCTZ 12.5mg  daily, Olmesartan 40mg  daily w/ good control Hypothyroid- chronic problem, on Levothyroxine 36mcg daily.  Currently asymptomatic. Hypertriglyceridemia- chronic problem, on Fenofibrate 160mg  daily Physical Activity: active but no formal exercise Fall Risk: low Depression: denies current sxs, doing well on medication Hearing: wears hearing aides ADL's: independent Cognitive: normal linear thought process, memory and attention intact Home Safety: safe at home, lives w/ daughter and SIL Height, Weight, BMI, Visual Acuity: see vitals, vision corrected w/ glasses following w/ ophtho Counseling: UTD on colonoscopy, declines mammo/DEXA.  UTD on pneumonia vaccines, due for flu Labs Ordered: See A&P Care Plan: See A&P  Patient Care Team    Relationship Specialty Notifications Start End  Midge Minium, MD PCP - General Family Medicine  10/07/10    Comment: Jonna Munro, MD Consulting Physician Ophthalmology  09/06/18      Review of Systems Patient reports no vision/ hearing changes, adenopathy,fever, weight change,  persistant/recurrent hoarseness , swallowing issues, chest pain, palpitations, edema, persistant/recurrent cough, hemoptysis, dyspnea (rest/exertional/paroxysmal nocturnal), gastrointestinal bleeding (melena, rectal bleeding), abdominal pain, significant heartburn, bowel changes, GU symptoms (dysuria, hematuria, incontinence), Gyn symptoms (abnormal  bleeding, pain),  syncope, focal weakness, memory loss, numbness & tingling, skin/hair/nail changes, abnormal bruising or bleeding, anxiety, or depression.     Objective:   Physical Exam General Appearance:    Alert, cooperative, no distress, appears stated age  Head:    Normocephalic, without obvious abnormality, atraumatic  Eyes:    PERRL,  conjunctiva/corneas clear, EOM's intact, fundi    benign, both eyes  Ears:    Normal TM's and external ear canals, both ears  Nose:   Deferred due to COVID  Throat:   Neck:   Supple, symmetrical, trachea midline, no adenopathy;    Thyroid: no enlargement/tenderness/nodules  Back:     Symmetric, no curvature, ROM normal, no CVA tenderness  Lungs:     Clear to auscultation bilaterally, respirations unlabored  Chest Wall:    No tenderness or deformity   Heart:    Regular rate and rhythm, S1 and S2 normal, no murmur, rub   or gallop  Breast Exam:    Deferred  Abdomen:     Soft, non-tender, bowel sounds active all four quadrants,    no masses, no organomegaly  Genitalia:    Deferred  Rectal:    Extremities:   Extremities normal, atraumatic, no cyanosis or edema  Pulses:   2+ and symmetric all extremities  Skin:   Skin color, texture, turgor normal, no rashes or lesions  Lymph nodes:   Cervical, supraclavicular, and axillary nodes normal  Neurologic:   CNII-XII intact, normal strength, sensation and reflexes    throughout          Assessment & Plan:

## 2018-09-07 LAB — BASIC METABOLIC PANEL
BUN: 29 mg/dL — ABNORMAL HIGH (ref 6–23)
CO2: 27 mEq/L (ref 19–32)
Calcium: 9.3 mg/dL (ref 8.4–10.5)
Chloride: 102 mEq/L (ref 96–112)
Creatinine, Ser: 1.01 mg/dL (ref 0.40–1.20)
GFR: 52.26 mL/min — ABNORMAL LOW (ref >60.00)
Glucose, Bld: 76 mg/dL (ref 70–99)
Potassium: 4 mEq/L (ref 3.5–5.1)
Sodium: 139 mEq/L (ref 135–145)

## 2018-09-07 LAB — CBC WITH DIFFERENTIAL/PLATELET
Basophils Absolute: 0.1 10*3/uL (ref 0.0–0.1)
Basophils Relative: 1.2 % (ref 0.0–3.0)
Eosinophils Absolute: 0.1 10*3/uL (ref 0.0–0.7)
Eosinophils Relative: 2 % (ref 0.0–5.0)
HCT: 36.6 % (ref 36.0–46.0)
Hemoglobin: 11.9 g/dL — ABNORMAL LOW (ref 12.0–15.0)
Lymphocytes Relative: 26.1 % (ref 12.0–46.0)
Lymphs Abs: 1.1 10*3/uL (ref 0.7–4.0)
MCHC: 32.4 g/dL (ref 30.0–36.0)
MCV: 84.3 fl (ref 78.0–100.0)
Monocytes Absolute: 0.4 10*3/uL (ref 0.1–1.0)
Monocytes Relative: 8.6 % (ref 3.0–12.0)
Neutro Abs: 2.6 10*3/uL (ref 1.4–7.7)
Neutrophils Relative %: 62.1 % (ref 43.0–77.0)
Platelets: 187 10*3/uL (ref 150.0–400.0)
RBC: 4.34 Mil/uL (ref 3.87–5.11)
RDW: 15.4 % (ref 11.5–15.5)
WBC: 4.2 10*3/uL (ref 4.0–10.5)

## 2018-09-07 LAB — HEPATIC FUNCTION PANEL
ALT: 9 U/L (ref 0–35)
AST: 16 U/L (ref 0–37)
Albumin: 4.2 g/dL (ref 3.5–5.2)
Alkaline Phosphatase: 42 U/L (ref 39–117)
Bilirubin, Direct: 0.1 mg/dL (ref 0.0–0.3)
Total Bilirubin: 0.3 mg/dL (ref 0.2–1.2)
Total Protein: 6.9 g/dL (ref 6.0–8.3)

## 2018-09-07 LAB — LIPID PANEL
Cholesterol: 188 mg/dL (ref 0–200)
HDL: 31.9 mg/dL — ABNORMAL LOW (ref >39.00)
NonHDL: 155.87
Total CHOL/HDL Ratio: 6
Triglycerides: 211 mg/dL — ABNORMAL HIGH (ref 0.0–149.0)
VLDL: 42.2 mg/dL — ABNORMAL HIGH (ref 0.0–40.0)

## 2018-09-07 LAB — TSH: TSH: 3.19 u[IU]/mL (ref 0.35–4.50)

## 2018-09-07 LAB — LDL CHOLESTEROL, DIRECT: Direct LDL: 135 mg/dL

## 2018-09-11 ENCOUNTER — Other Ambulatory Visit: Payer: Self-pay | Admitting: Family Medicine

## 2018-10-17 ENCOUNTER — Other Ambulatory Visit: Payer: Self-pay | Admitting: Family Medicine

## 2018-10-23 ENCOUNTER — Other Ambulatory Visit: Payer: Self-pay | Admitting: Family Medicine

## 2018-11-22 ENCOUNTER — Telehealth: Payer: Self-pay | Admitting: Family Medicine

## 2018-11-22 MED ORDER — NAPROXEN 250 MG PO TABS: ORAL_TABLET | ORAL | 0 refills | Status: DC

## 2018-11-22 NOTE — Telephone Encounter (Signed)
Pt called Samantha Briggs

## 2018-11-22 NOTE — Telephone Encounter (Signed)
Please advise 

## 2018-11-22 NOTE — Telephone Encounter (Signed)
Pt called in stating she is having gout pain in there big toe again. She wanted to know if Dr. Birdie Riddle would be willing to call her in some medication for this. Pt can be reached at the home #

## 2018-11-22 NOTE — Telephone Encounter (Signed)
[place] for Naproxen 250mg - 2 tabs for first dose followed by 1 tab q8 hrs x5 days, #16, no refills.  Take w/ food

## 2018-11-22 NOTE — Telephone Encounter (Signed)
Medication filled to pharmacy as requested.  Pt made aware.  

## 2018-11-23 ENCOUNTER — Other Ambulatory Visit: Payer: Self-pay | Admitting: Family Medicine

## 2018-11-28 ENCOUNTER — Telehealth: Payer: Self-pay | Admitting: *Deleted

## 2018-11-28 NOTE — Telephone Encounter (Signed)
Patient states about 5 years ago she got hearing aids at [facility] - she is due for more but wanted to check to see if Dr. Birdie Riddle had any good suggestions on brands or places to go.   No rush on anything just yet, but she's starting to look and wanted an opinion.

## 2018-12-02 NOTE — Telephone Encounter (Signed)
[place] is a good option

## 2018-12-03 ENCOUNTER — Telehealth: Payer: Self-pay | Admitting: Family Medicine

## 2018-12-03 DIAGNOSIS — Z0110 Encounter for examination of ears and hearing without abnormal findings: Secondary | ICD-10-CM

## 2018-12-03 NOTE — Telephone Encounter (Signed)
Ok for referral?

## 2018-12-03 NOTE — Telephone Encounter (Signed)
Patient daughter notified of PCP recommendations and is agreement and expresses an understanding.   Ok for PEC to Discuss results / PCP recommendations / Schedule patient.   

## 2018-12-03 NOTE — Telephone Encounter (Signed)
Referral placed.

## 2018-12-03 NOTE — Telephone Encounter (Signed)
Patient daughter, Leveda Anna, called asking for a referral to [facility] on [address] for an hearing test for the patient.

## 2018-12-26 ENCOUNTER — Other Ambulatory Visit: Payer: Self-pay | Admitting: Family Medicine

## 2019-01-25 ENCOUNTER — Other Ambulatory Visit: Payer: Self-pay | Admitting: Family Medicine

## 2019-02-15 ENCOUNTER — Other Ambulatory Visit: Payer: Self-pay | Admitting: Family Medicine

## 2019-03-07 ENCOUNTER — Ambulatory Visit (INDEPENDENT_AMBULATORY_CARE_PROVIDER_SITE_OTHER): Payer: Medicare Other | Admitting: Family Medicine

## 2019-03-07 ENCOUNTER — Other Ambulatory Visit: Payer: Self-pay

## 2019-03-07 ENCOUNTER — Encounter: Payer: Self-pay | Admitting: Family Medicine

## 2019-03-07 VITALS — BP 121/67 | HR 59 | Ht 67.0 in | Wt 172.0 lb

## 2019-03-07 DIAGNOSIS — I10 Essential (primary) hypertension: Secondary | ICD-10-CM | POA: Diagnosis not present

## 2019-03-07 DIAGNOSIS — E781 Pure hyperglyceridemia: Secondary | ICD-10-CM

## 2019-03-07 DIAGNOSIS — E038 Other specified hypothyroidism: Secondary | ICD-10-CM | POA: Diagnosis not present

## 2019-03-07 NOTE — Progress Notes (Signed)
Virtual Visit via Video   I connected with patient on 03/07/19 at  2:30 PM EST by a video enabled telemedicine application and verified that I am speaking with the correct person using two identifiers.  Location patient: Home Location provider: Acupuncturist, Office Persons participating in the virtual visit: Patient, Provider, Wilmer (Jess B)  I discussed the limitations of evaluation and management by telemedicine and the availability of in person appointments. The patient expressed understanding and agreed to proceed.  Subjective:   HPI:  HTN- chronic problem, on HCTZ 12.5mg  daily and Olmesartan 40mg  daily w/ good control.  'I am feeling really good'.  Denies CP, SOB, HAs, visual changes, edema.  Hypothyroid- chronic problem, on Levothyroxine 55mcg daily.  Denies fatigue, changes to skin/hair/nails.  Hyperlipidemia- chronic problem, on Fenofibrate 160mg  daily.  No abd pain, N/V.  ROS:   See pertinent positives and negatives per HPI.  Patient Active Problem List   Diagnosis Date Noted  . Overweight (BMI 25.0-29.9) 06/28/2018  . Hypothyroidism 04/21/2015  . Sciatica of right side 12/02/2014  . Anemia, iron deficiency 11/19/2013  . IBS (irritable bowel syndrome) 06/17/2013  . GERD (gastroesophageal reflux disease) 06/17/2013  . Hypertriglyceridemia 03/13/2013  . Routine general medical examination at a health care facility 09/19/2012  . Plantar fasciitis 08/17/2012  . Gout 08/17/2012  . Post herpetic neuralgia 06/02/2011  . Insomnia 05/18/2011  . HTN (hypertension) 10/17/2010  . Depression with anxiety 10/17/2010  . Glaucoma 10/17/2010    Social History   Tobacco Use  . Smoking status: Former Smoker    Types: Cigarettes    Quit date: 01/04/1972    Years since quitting: 47.2  . Smokeless tobacco: Never Used  Substance Use Topics  . Alcohol use: No    Current Outpatient Medications:  .  aspirin 81 MG tablet, Take 81 mg by mouth daily.  , Disp: , Rfl:  .   cetirizine (ZYRTEC) 10 MG tablet, Take 10 mg by mouth daily., Disp: , Rfl:  .  dorzolamide (TRUSOPT) 2 % ophthalmic solution, INSTILL ONE DROP INTO LEFT EYE TWICE DAILY, Disp: , Rfl: 11 .  erythromycin ophthalmic ointment, APPLY TO RIGHT LOWER EYELID, Disp: , Rfl: 1 .  fenofibrate 160 MG tablet, TAKE ONE TABLET BY MOUTH DAILY, Disp: 90 tablet, Rfl: 0 .  ferrous sulfate 325 (65 FE) MG tablet, Take 1 tablet (325 mg total) by mouth daily with breakfast., Disp: 30 tablet, Rfl: 6 .  fluticasone (FLONASE) 50 MCG/ACT nasal spray, Place 2 sprays into both nostrils daily., Disp: 16 g, Rfl: 5 .  hydrochlorothiazide (HYDRODIURIL) 12.5 MG tablet, TAKE ONE TABLET BY MOUTH DAILY, Disp: 90 tablet, Rfl: 1 .  latanoprost (XALATAN) 0.005 % ophthalmic solution, Place 1 drop into the left eye daily. , Disp: , Rfl:  .  levothyroxine (SYNTHROID) 88 MCG tablet, TAKE ONE TABLET BY MOUTH DAILY, Disp: 90 tablet, Rfl: 0 .  naproxen (NAPROSYN) 250 MG tablet, Take 2 tablet by mouth for the first dose, then one tablet by mouth every 8 hours for 5 days., Disp: 16 tablet, Rfl: 0 .  olmesartan (BENICAR) 40 MG tablet, TAKE ONE TABLET BY MOUTH DAILY, Disp: 90 tablet, Rfl: 1 .  omeprazole (PRILOSEC) 20 MG capsule, TAKE ONE CAPSULE BY MOUTH DAILY, Disp: 90 capsule, Rfl: 0 .  PRED MILD 0.12 % ophthalmic suspension, Place 1 drop into both eyes. , Disp: , Rfl:  .  Probiotic Product (PROBIOTIC DAILY) CAPS, Take by mouth., Disp: , Rfl:  .  sertraline (  ZOLOFT) 100 MG tablet, TAKE ONE TABLET BY MOUTH DAILY (Patient taking differently: Take 50 mg by mouth daily. ), Disp: 30 tablet, Rfl: 3 .  XIIDRA 5 % SOLN, INSTILL DROPS INTO AFFECTED EYE(S) AS DIRECTED, Disp: , Rfl: 3  Allergies  Allergen Reactions  . Morphine And Related Hives  . Neurontin [Gabapentin]     hives    Objective:   BP 121/67   Pulse (!) 59   Ht 5\' 7"  (1.702 m)   Wt 172 lb (78 kg)   BMI 26.94 kg/m   AAOx3, NAD NCAT, EOMI No obvious CN deficits Coloring WNL Pt  is able to speak clearly, coherently without shortness of breath or increased work of breathing.  Thought process is linear.  Mood is appropriate.   Assessment and Plan:   HTN- chronic problem, well controlled.  Asymptomatic.  Check labs.  No anticipated med changes.  Will follow.  Hyperlipidemia- chronic problem, on Fenofibrate daily w/o difficulty.  Check labs.  Adjust meds prn   Hypothyroid- currently asymptomatic.  Check labs.  Adjust meds prn    , MD 03/07/2019

## 2019-03-07 NOTE — Progress Notes (Signed)
I have discussed the procedure for the virtual visit with the patient who has given consent to proceed with assessment and treatment.   Jessica L Brodmerkel, CMA     

## 2019-03-11 ENCOUNTER — Other Ambulatory Visit (INDEPENDENT_AMBULATORY_CARE_PROVIDER_SITE_OTHER): Payer: Medicare Other

## 2019-03-11 ENCOUNTER — Other Ambulatory Visit: Payer: Self-pay

## 2019-03-11 DIAGNOSIS — E781 Pure hyperglyceridemia: Secondary | ICD-10-CM

## 2019-03-11 DIAGNOSIS — I10 Essential (primary) hypertension: Secondary | ICD-10-CM

## 2019-03-11 DIAGNOSIS — E038 Other specified hypothyroidism: Secondary | ICD-10-CM

## 2019-03-12 LAB — BASIC METABOLIC PANEL WITH GFR
BUN: 34 mg/dL — ABNORMAL HIGH (ref 6–23)
CO2: 28 meq/L (ref 19–32)
Calcium: 9.4 mg/dL (ref 8.4–10.5)
Chloride: 103 meq/L (ref 96–112)
Creatinine, Ser: 1.36 mg/dL — ABNORMAL HIGH (ref 0.40–1.20)
GFR: 37.03 mL/min — ABNORMAL LOW
Glucose, Bld: 78 mg/dL (ref 70–99)
Potassium: 4.4 meq/L (ref 3.5–5.1)
Sodium: 139 meq/L (ref 135–145)

## 2019-03-12 LAB — HEPATIC FUNCTION PANEL
ALT: 8 U/L (ref 0–35)
AST: 14 U/L (ref 0–37)
Albumin: 4.1 g/dL (ref 3.5–5.2)
Alkaline Phosphatase: 49 U/L (ref 39–117)
Bilirubin, Direct: 0 mg/dL (ref 0.0–0.3)
Total Bilirubin: 0.3 mg/dL (ref 0.2–1.2)
Total Protein: 6.8 g/dL (ref 6.0–8.3)

## 2019-03-12 LAB — CBC WITH DIFFERENTIAL/PLATELET
Basophils Absolute: 0 10*3/uL (ref 0.0–0.1)
Basophils Relative: 0.6 % (ref 0.0–3.0)
Eosinophils Absolute: 0.1 10*3/uL (ref 0.0–0.7)
Eosinophils Relative: 1.9 % (ref 0.0–5.0)
HCT: 36.1 % (ref 36.0–46.0)
Hemoglobin: 11.6 g/dL — ABNORMAL LOW (ref 12.0–15.0)
Lymphocytes Relative: 29.5 % (ref 12.0–46.0)
Lymphs Abs: 1.5 10*3/uL (ref 0.7–4.0)
MCHC: 32.2 g/dL (ref 30.0–36.0)
MCV: 82.5 fl (ref 78.0–100.0)
Monocytes Absolute: 0.6 10*3/uL (ref 0.1–1.0)
Monocytes Relative: 11 % (ref 3.0–12.0)
Neutro Abs: 2.9 10*3/uL (ref 1.4–7.7)
Neutrophils Relative %: 57 % (ref 43.0–77.0)
Platelets: 186 10*3/uL (ref 150.0–400.0)
RBC: 4.37 Mil/uL (ref 3.87–5.11)
RDW: 15.9 % — ABNORMAL HIGH (ref 11.5–15.5)
WBC: 5.2 10*3/uL (ref 4.0–10.5)

## 2019-03-12 LAB — LDL CHOLESTEROL, DIRECT: Direct LDL: 107 mg/dL

## 2019-03-12 LAB — LIPID PANEL
Cholesterol: 172 mg/dL (ref 0–200)
HDL: 32.7 mg/dL — ABNORMAL LOW
NonHDL: 138.82
Total CHOL/HDL Ratio: 5
Triglycerides: 249 mg/dL — ABNORMAL HIGH (ref 0.0–149.0)
VLDL: 49.8 mg/dL — ABNORMAL HIGH (ref 0.0–40.0)

## 2019-03-12 LAB — TSH: TSH: 2.71 u[IU]/mL (ref 0.35–4.50)

## 2019-03-13 NOTE — Progress Notes (Signed)
Called pt and lmovm to return call.

## 2019-03-14 ENCOUNTER — Other Ambulatory Visit: Payer: Self-pay | Admitting: Family Medicine

## 2019-03-14 DIAGNOSIS — R7989 Other specified abnormal findings of blood chemistry: Secondary | ICD-10-CM

## 2019-03-20 ENCOUNTER — Other Ambulatory Visit: Payer: Medicare Other

## 2019-03-21 ENCOUNTER — Other Ambulatory Visit: Payer: Self-pay

## 2019-03-21 ENCOUNTER — Other Ambulatory Visit (INDEPENDENT_AMBULATORY_CARE_PROVIDER_SITE_OTHER): Payer: Medicare Other

## 2019-03-21 DIAGNOSIS — R7989 Other specified abnormal findings of blood chemistry: Secondary | ICD-10-CM

## 2019-03-21 LAB — BASIC METABOLIC PANEL
BUN: 23 mg/dL (ref 6–23)
CO2: 31 mEq/L (ref 19–32)
Calcium: 9.1 mg/dL (ref 8.4–10.5)
Chloride: 104 mEq/L (ref 96–112)
Creatinine, Ser: 1.03 mg/dL (ref 0.40–1.20)
GFR: 51.02 mL/min — ABNORMAL LOW (ref >60.00)
Glucose, Bld: 107 mg/dL — ABNORMAL HIGH (ref 70–99)
Potassium: 3.9 mEq/L (ref 3.5–5.1)
Sodium: 140 mEq/L (ref 135–145)

## 2019-03-28 DIAGNOSIS — Z961 Presence of intraocular lens: Secondary | ICD-10-CM | POA: Diagnosis not present

## 2019-03-28 DIAGNOSIS — H524 Presbyopia: Secondary | ICD-10-CM | POA: Diagnosis not present

## 2019-03-28 DIAGNOSIS — H52203 Unspecified astigmatism, bilateral: Secondary | ICD-10-CM | POA: Diagnosis not present

## 2019-03-28 DIAGNOSIS — H401123 Primary open-angle glaucoma, left eye, severe stage: Secondary | ICD-10-CM | POA: Diagnosis not present

## 2019-04-02 ENCOUNTER — Telehealth: Payer: Self-pay | Admitting: Family Medicine

## 2019-04-02 NOTE — Progress Notes (Signed)
  Chronic Care Management   Note  04/02/2019 Name: CHAMARI CUTBIRTH MRN: [id number] DOB: [birth year]  NKECHI LINEHAN is a 86 y.o. year old female who is a primary care patient of Beverely Low, Helane Rima, MD. I reached out to [name] by phone today in response to a referral sent by Ms. Jimmy Footman Kopera's PCP, Sheliah Hatch, MD.   Ms. Arcand was given information about Chronic Care Management services today including:  1. CCM service includes personalized support from designated clinical staff supervised by her physician, including individualized plan of care and coordination with other care providers 2. 24/7 contact phone numbers for assistance for urgent and routine care needs. 3. Service will only be billed when office clinical staff spend 20 minutes or more in a month to coordinate care. 4. Only one practitioner may furnish and bill the service in a calendar month. 5. The patient may stop CCM services at any time (effective at the end of the month) by phone call to the office staff.   Patient agreed to services and verbal consent obtained.   Follow up plan:   Lynnae January [facility] Scheduler

## 2019-04-09 ENCOUNTER — Ambulatory Visit: Payer: Medicare Other

## 2019-04-09 DIAGNOSIS — I10 Essential (primary) hypertension: Secondary | ICD-10-CM

## 2019-04-09 DIAGNOSIS — E781 Pure hyperglyceridemia: Secondary | ICD-10-CM

## 2019-04-09 DIAGNOSIS — E038 Other specified hypothyroidism: Secondary | ICD-10-CM

## 2019-04-09 NOTE — Patient Instructions (Addendum)
Visit Information  Goals Addressed            This Visit's Progress   . PharmD Care Plan       CARE PLAN ENTRY  Current Barriers:  . Chronic Disease Management support, education, and care coordination needs related to HTN, HLD, and hypothyroidism    Pharmacist Clinical Goal(s):  . TSH 0.35-4.5 . BP <140/90 . LDL <100  Interventions: . Comprehensive medication review performed.  Patient Self Care Activities:  . Patient verbalizes understanding of plan to call pharmacist with any questions.  Initial goal documentation       Samantha Briggs was given information about Chronic Care Management services today including:  1. CCM service includes personalized support from designated clinical staff supervised by her physician, including individualized plan of care and coordination with other care providers 2. 24/7 contact phone numbers for assistance for urgent and routine care needs. 3. Standard insurance, coinsurance, copays and deductibles apply for chronic care management only during months in which we provide at least 20 minutes of these services. Most insurances cover these services at 100%, however patients may be responsible for any copay, coinsurance and/or deductible if applicable. This service may help you avoid the need for more expensive face-to-face services. 4. Only one practitioner may furnish and bill the service in a calendar month. 5. The patient may stop CCM services at any time (effective at the end of the month) by phone call to the office staff.  Patient agreed to services and verbal consent obtained.   The patient verbalized understanding of instructions provided today and agreed to receive a mailed copy of patient instruction and/or educational materials. Face to Face appointment with pharmacist scheduled for:  10/4  Madelin Rear, Pharm.D. Clinical Pharmacist [facility] [phone]   High Cholesterol  High cholesterol  is a condition in which the blood has high levels of a white, waxy, fat-like substance (cholesterol). The human body needs small amounts of cholesterol. The liver makes all the cholesterol that the body needs. Extra (excess) cholesterol comes from the food that we eat. Cholesterol is carried from the liver by the blood through the blood vessels. If you have high cholesterol, deposits (plaques) may build up on the walls of your blood vessels (arteries). Plaques make the arteries narrower and stiffer. Cholesterol plaques increase your risk for heart attack and stroke. Work with your health care provider to keep your cholesterol levels in a healthy range. What increases the risk? This condition is more likely to develop in people who:  Eat foods that are high in animal fat (saturated fat) or cholesterol.  Are overweight.  Are not getting enough exercise.  Have a family history of high cholesterol. What are the signs or symptoms? There are no symptoms of this condition. How is this diagnosed? This condition may be diagnosed from the results of a blood test.  If you are older than age 38, your health care provider may check your cholesterol every 4-6 years.  You may be checked more often if you already have high cholesterol or other risk factors for heart disease. The blood test for cholesterol measures:  "Bad" cholesterol (LDL cholesterol). This is the main type of cholesterol that causes heart disease. The desired level for LDL is less than 100.  "Good" cholesterol (HDL cholesterol). This type helps to protect against heart disease by cleaning the arteries and carrying the LDL away. The desired level for HDL is 60 or higher.  Triglycerides. These are fats that the body can store or burn for energy. The desired number for triglycerides is lower than 150.  Total cholesterol. This is a measure of the total amount of cholesterol in your blood, including LDL cholesterol, HDL cholesterol, and  triglycerides. A healthy number is less than 200. How is this treated? This condition is treated with diet changes, lifestyle changes, and medicines. Diet changes  This may include eating more whole grains, fruits, vegetables, nuts, and fish.  This may also include cutting back on red meat and foods that have a lot of added sugar. Lifestyle changes  Changes may include getting at least 40 minutes of aerobic exercise 3 times a week. Aerobic exercises include walking, biking, and swimming. Aerobic exercise along with a healthy diet can help you maintain a healthy weight.  Changes may also include quitting smoking. Medicines  Medicines are usually given if diet and lifestyle changes have failed to reduce your cholesterol to healthy levels.  Your health care provider may prescribe a statin medicine. Statin medicines have been shown to reduce cholesterol, which can reduce the risk of heart disease. Follow these instructions at home: Eating and drinking If told by your health care provider:  Eat chicken (without skin), fish, veal, shellfish, ground [place] breast, and round or loin cuts of red meat.  Do not eat fried foods or fatty meats, such as hot dogs and salami.  Eat plenty of fruits, such as apples.  Eat plenty of vegetables, such as broccoli, potatoes, and carrots.  Eat beans, peas, and lentils.  Eat grains such as barley, rice, couscous, and bulgur wheat.  Eat pasta without cream sauces.  Use skim or nonfat milk, and eat low-fat or nonfat yogurt and cheeses.  Do not eat or drink whole milk, cream, ice cream, egg yolks, or hard cheeses.  Do not eat stick margarine or tub margarines that contain trans fats (also called partially hydrogenated oils).  Do not eat saturated tropical oils, such as coconut oil and palm oil.  Do not eat cakes, cookies, crackers, or other baked goods that contain trans fats.  General instructions  Exercise as directed by your health care  provider. Increase your activity level with activities such as gardening, walking, and taking the stairs.  Take over-the-counter and prescription medicines only as told by your health care provider.  Do not use any products that contain nicotine or tobacco, such as cigarettes and e-cigarettes. If you need help quitting, ask your health care provider.  Keep all follow-up visits as told by your health care provider. This is important. Contact a health care provider if:  You are struggling to maintain a healthy diet or weight.  You need help to start on an exercise program.  You need help to stop smoking. Get help right away if:  You have chest pain.  You have trouble breathing. This information is not intended to replace advice given to you by your health care provider. Make sure you discuss any questions you have with your health care provider. Document Revised: 12/23/2016 Document Reviewed: 06/20/2015 Elsevier Patient Education  2020 [employer].

## 2019-04-09 NOTE — Progress Notes (Signed)
Chronic Care Management Pharmacy  Name: Samantha Briggs  MRN: [id number] DOB: March 12, 1932  Chief Complaint/HPI Samantha Briggs,  86 y.o. , female presents for their Initial CCM visit with the clinical pharmacist via telephone due to COVID-19 Pandemic.  PCP : Samantha Hatch, MD  Their chronic conditions include: HTN, HLD and Hypothyroidism  Office Visits: 03/2019: Scr intermittently elevated; advised to inc water intake then stabilized on f/u lab.  Outpatient Encounter Medications as of 04/09/2019  Medication Sig Note  . cetirizine (ZYRTEC) 10 MG tablet Take 10 mg by mouth daily.   . dorzolamide (TRUSOPT) 2 % ophthalmic solution INSTILL ONE DROP INTO LEFT EYE TWICE DAILY 09/05/2014: Received from: External Pharmacy  . erythromycin ophthalmic ointment APPLY TO RIGHT LOWER EYELID   . fenofibrate 160 MG tablet TAKE ONE TABLET BY MOUTH DAILY   . fluticasone (FLONASE) 50 MCG/ACT nasal spray Place 2 sprays into both nostrils daily.   . hydrochlorothiazide (HYDRODIURIL) 12.5 MG tablet TAKE ONE TABLET BY MOUTH DAILY   . latanoprost (XALATAN) 0.005 % ophthalmic solution Place 1 drop into the left eye daily.    Marland Kitchen levothyroxine (SYNTHROID) 88 MCG tablet TAKE ONE TABLET BY MOUTH DAILY   . naproxen (NAPROSYN) 250 MG tablet Take 2 tablet by mouth for the first dose, then one tablet by mouth every 8 hours for 5 days.   Marland Kitchen olmesartan (BENICAR) 40 MG tablet TAKE ONE TABLET BY MOUTH DAILY   . omeprazole (PRILOSEC) 20 MG capsule TAKE ONE CAPSULE BY MOUTH DAILY   . PRED MILD 0.12 % ophthalmic suspension Place 1 drop into both eyes.    Marland Kitchen sertraline (ZOLOFT) 100 MG tablet TAKE ONE TABLET BY MOUTH DAILY (Patient taking differently: Take 50 mg by mouth daily. )   . XIIDRA 5 % SOLN INSTILL DROPS INTO AFFECTED EYE(S) AS DIRECTED   . aspirin 81 MG tablet Take 81 mg by mouth daily.     . ferrous sulfate 325 (65 FE) MG tablet Take 1 tablet (325 mg total) by mouth daily with breakfast. (Patient not taking:  Reported on 04/09/2019)   . Probiotic Product (PROBIOTIC DAILY) CAPS Take by mouth.    No facility-administered encounter medications on file as of 04/09/2019.   Current Diagnosis/Assessment:  Goals Addressed            This Visit's Progress   . PharmD Care Plan       CARE PLAN ENTRY  Current Barriers:  . Chronic Disease Management support, education, and care coordination needs related to HTN, HLD, and hypothyroidism    Pharmacist Clinical Goal(s):  . TSH 0.35-4.5 . BP <140/90 . LDL <100  Interventions: . Comprehensive medication review performed.  Patient Self Care Activities:  . Patient verbalizes understanding of plan to call pharmacist with any questions.  Initial goal documentation       Hypertension  BP today is:  <130/80  Office blood pressures are : BP Readings from Last 3 Encounters:  03/07/19 121/67  09/06/18 123/78  06/28/18 138/89  Currently taking olmesartan 40 mg daily, HCTZ 12.5 mg daily  Patient checks BP at home several times per month.  Patient home BP readings are ranging: 120s/70s. Denies dizziness, SOB, chest pain. Denies any recent falls.   Plan Continue current medications    Hyperlipidemia   Lipid Panel     Component Value Date/Time   CHOL 172 03/11/2019 1452   TRIG 249.0 (H) 03/11/2019 1452   HDL 32.70 (L) 03/11/2019 1452   CHOLHDL 5  03/11/2019 1452   VLDL 49.8 (H) 03/11/2019 1452   LDLCALC 130 (H) 04/10/2014 1045   LDLDIRECT 107.0 03/11/2019 1452    The ASCVD Risk score (Goff DC Jr., et al., 2013) failed to calculate for the following reasons:   The 2013 ASCVD risk score is only valid for ages 23 to 51   Patient is currently controlled on the following medications: fenofibrate 160 mg daily.  Denies any side effects at this time such as muscle pain. Reported increased bleeding with aspirin 81mg , so longer using. No Hx of CVD.   We discussed diet, with consideration to reducing simple carb intake.  Recent example of  diet:  Breakfast: bagel, coffee  Lunch: leftovers, cooked meal  Dinner: tuna, chicken  Plan Continue current medications and control with diet and exercise   Hypothyroidism   TSH  Date Value Ref Range Status  03/11/2019 2.71 0.35 - 4.50 uIU/mL Final  Patient is currently controlled on the following medications: levothyroxine 88 mcg. We discussed: proper administration techniques for levothyroxine, patient expresses great understanding. Denies any symptoms of feeling more tired/sluggish than usual.   Plan Continue current medications   Anxiety/Depression  Patient is currently controlled on the following medications: sertraline 50 mg daily. Was started at the time she transitioned into living with family, and notes continued benefit.   Plan Continue current medications   GERD  Patient is currently controlled on the following medications: omeprazole 20mg  daily. Has had for a number of years. Counseled on medication use, patient is well aware of triggers to avoid such as cooked onions, tomato sauce, spicy foods. Denies any symptoms of acid reflux over the last month   Plan Continue current medication    Vaccines  Reviewed and discussed patient's vaccination history.  Patient is up to date on recommended vaccines, including COVID.   Immunization History  Administered Date(s) Administered  . Fluad Quad(high Dose 65+) 09/06/2018  . Influenza, High Dose Seasonal PF 09/25/2013, 08/29/2014, 10/21/2015, 08/29/2016, 09/07/2017  . Influenza,inj,Quad PF,6+ Mos 09/19/2012  . PFIZER SARS-COV-2 Vaccination 01/16/2019, 02/06/2019  . Pneumococcal Conjugate-13 04/10/2014  . Pneumococcal Polysaccharide-23 11/04/2011  . Td 12/12/2012  . Tdap 10/28/2013  . Zoster 06/02/2011  . Zoster Recombinat (Shingrix) 06/02/2017, 06/29/2017   Plan No recommendations. Patient is up to date on recommended vaccines   Medication Management  Pt uses [facility] [employer] for all medications. Currently  uses home delivery/synch services through them. May consider pill packaging in the future. Reports great compliance, rarely misses dose. ______________ Visit Information Ms. Samantha Briggs was given information about Chronic Care Management services today including:  1. CCM service includes personalized support from designated clinical staff supervised by her physician, including individualized plan of care and coordination with other care providers 2. 24/7 contact phone numbers for assistance for urgent and routine care needs. 3. Standard insurance, coinsurance, copays and deductibles apply for chronic care management only during months in which we provide at least 20 minutes of these services. Most insurances cover these services at 100%, however patients may be responsible for any copay, coinsurance and/or deductible if applicable. This service may help you avoid the need for more expensive face-to-face services. 4. Only one practitioner may furnish and bill the service in a calendar month. 5. The patient may stop CCM services at any time (effective at the end of the month) by phone call to the office staff.  Patient agreed to services and verbal consent obtained.   Madelin Rear, Pharm.D. Clinical Pharmacist [facility] at  [facility] [employer] [phone]

## 2019-04-11 ENCOUNTER — Other Ambulatory Visit: Payer: Self-pay | Admitting: Family Medicine

## 2019-04-22 IMAGING — DX DG FOOT COMPLETE 3+V*R*
3 series · 3 of 3 positions shown · non-contrast
Comparison: None.

CLINICAL DATA: Right foot pain

EXAM:
RIGHT FOOT COMPLETE - 3+ VIEW

[foot ap]
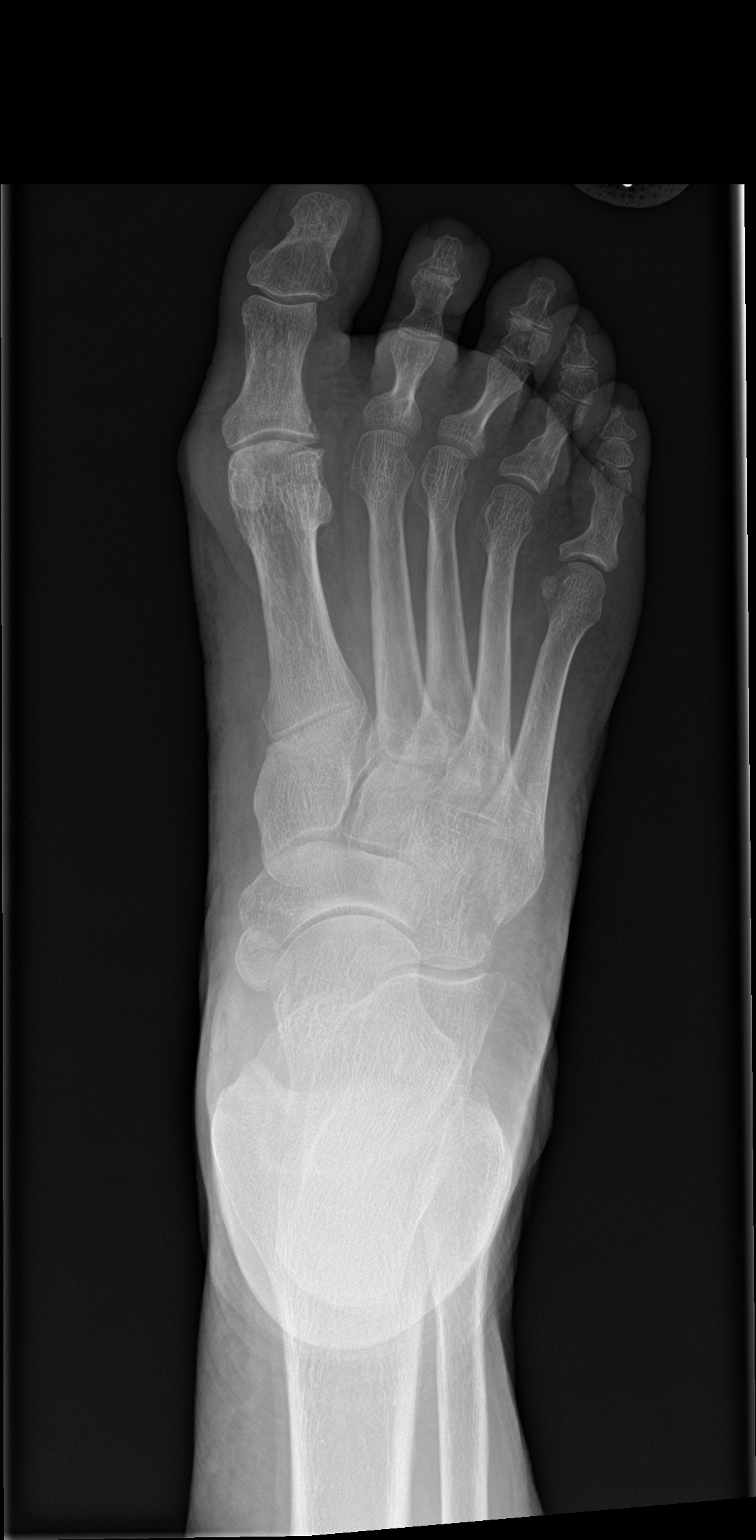

[foot obl]
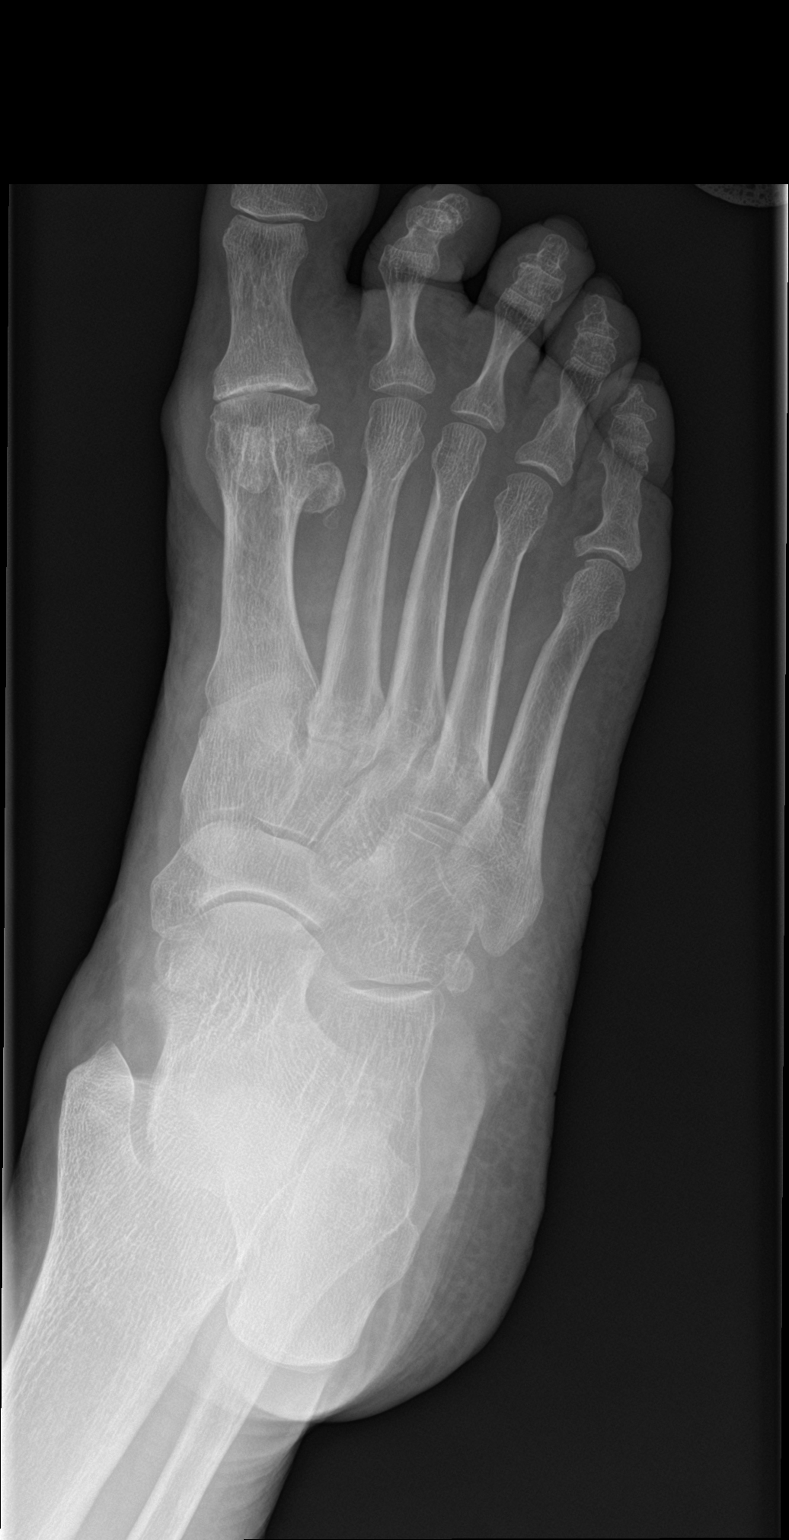

[foot lat]
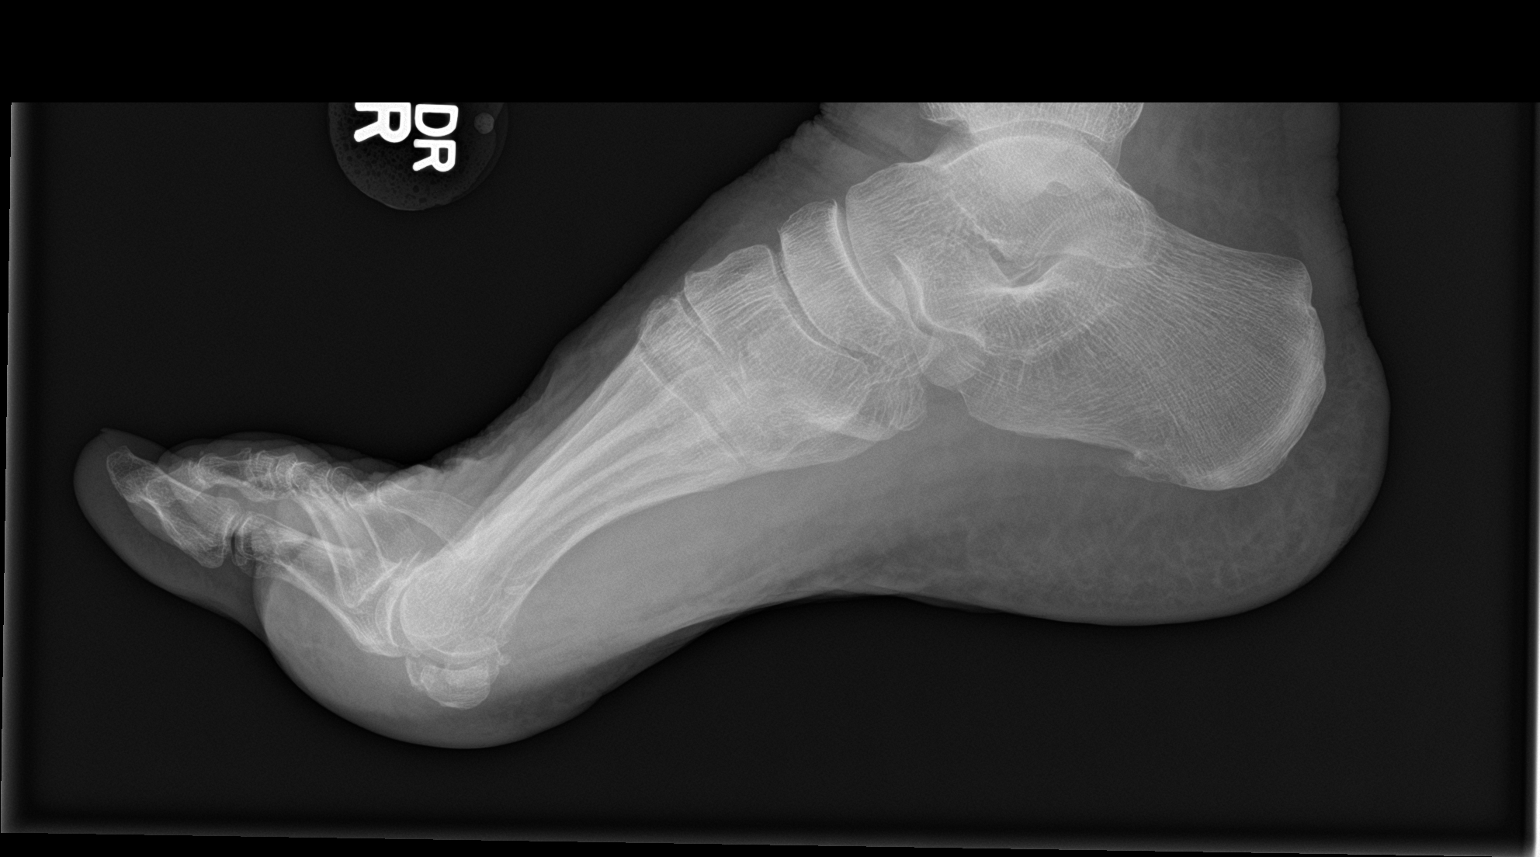

[3 of 3 positions shown; findings below may reference images not displayed]

FINDINGS: No fracture or dislocation is seen.

Mild degenerative changes the 1st MTP joint.

Small plantar calcaneal enthesophyte.

The visualized soft tissues are unremarkable.
IMPRESSION: Negative.

## 2019-05-03 ENCOUNTER — Other Ambulatory Visit: Payer: Self-pay | Admitting: Family Medicine

## 2019-06-04 ENCOUNTER — Encounter: Payer: Self-pay | Admitting: Physician Assistant

## 2019-06-04 ENCOUNTER — Ambulatory Visit (INDEPENDENT_AMBULATORY_CARE_PROVIDER_SITE_OTHER): Payer: Medicare Other | Admitting: Physician Assistant

## 2019-06-04 ENCOUNTER — Other Ambulatory Visit: Payer: Self-pay

## 2019-06-04 VITALS — BP 131/81 | HR 81 | Temp 97.9°F | Resp 16 | Ht 67.0 in | Wt 176.5 lb

## 2019-06-04 DIAGNOSIS — M545 Low back pain, unspecified: Secondary | ICD-10-CM

## 2019-06-04 DIAGNOSIS — R102 Pelvic and perineal pain: Secondary | ICD-10-CM

## 2019-06-04 LAB — COMPREHENSIVE METABOLIC PANEL
ALT: 7 U/L (ref 0–35)
AST: 11 U/L (ref 0–37)
Albumin: 4.2 g/dL (ref 3.5–5.2)
Alkaline Phosphatase: 42 U/L (ref 39–117)
BUN: 35 mg/dL — ABNORMAL HIGH (ref 6–23)
CO2: 28 mEq/L (ref 19–32)
Calcium: 9.4 mg/dL (ref 8.4–10.5)
Chloride: 103 mEq/L (ref 96–112)
Creatinine, Ser: 0.96 mg/dL (ref 0.40–1.20)
GFR: 55.31 mL/min — ABNORMAL LOW (ref >60.00)
Glucose, Bld: 93 mg/dL (ref 70–99)
Potassium: 4.2 mEq/L (ref 3.5–5.1)
Sodium: 137 mEq/L (ref 135–145)
Total Bilirubin: 0.3 mg/dL (ref 0.2–1.2)
Total Protein: 6.9 g/dL (ref 6.0–8.3)

## 2019-06-04 LAB — POCT URINALYSIS DIPSTICK
Bilirubin, UA: NEGATIVE
Blood, UA: NEGATIVE
Glucose, UA: NEGATIVE
Ketones, UA: NEGATIVE
Leukocytes, UA: NEGATIVE
Nitrite, UA: NEGATIVE
Protein, UA: NEGATIVE
Spec Grav, UA: 1.015 (ref 1.010–1.025)
Urobilinogen, UA: 0.2 E.U./dL
pH, UA: 5.5 (ref 5.0–8.0)

## 2019-06-04 LAB — CBC WITH DIFFERENTIAL/PLATELET
Basophils Absolute: 0 10*3/uL (ref 0.0–0.1)
Basophils Relative: 0.4 % (ref 0.0–3.0)
Eosinophils Absolute: 0.1 10*3/uL (ref 0.0–0.7)
Eosinophils Relative: 1 % (ref 0.0–5.0)
HCT: 34.1 % — ABNORMAL LOW (ref 36.0–46.0)
Hemoglobin: 11.2 g/dL — ABNORMAL LOW (ref 12.0–15.0)
Lymphocytes Relative: 18.9 % (ref 12.0–46.0)
Lymphs Abs: 1.1 10*3/uL (ref 0.7–4.0)
MCHC: 32.8 g/dL (ref 30.0–36.0)
MCV: 82.9 fl (ref 78.0–100.0)
Monocytes Absolute: 0.5 10*3/uL (ref 0.1–1.0)
Monocytes Relative: 8.6 % (ref 3.0–12.0)
Neutro Abs: 4.2 10*3/uL (ref 1.4–7.7)
Neutrophils Relative %: 71.1 % (ref 43.0–77.0)
Platelets: 189 10*3/uL (ref 150.0–400.0)
RBC: 4.12 Mil/uL (ref 3.87–5.11)
RDW: 15 % (ref 11.5–15.5)
WBC: 5.8 10*3/uL (ref 4.0–10.5)

## 2019-06-04 MED ORDER — CELECOXIB 100 MG PO CAPS: 100.0000 mg | ORAL_CAPSULE | Freq: Two times a day (BID) | ORAL | 0 refills | Status: DC

## 2019-06-04 NOTE — Patient Instructions (Signed)
Please avoid heavy lifting and overexertion.  This includes those grandchildren! Apply heating pad to lower back for 10-15 minutes a few times per day.  Take the Celebrex twice daily with food. This should be easier on your stomach than other antiinflammatories.   Your urine was normal today. Will call with lab results but giving that pain is reproduced with movement, these seems musculoskeletal in nature.   Please notify us of any new or worsening symptoms while we await the rest of your results   Hang in there!

## 2019-06-04 NOTE — Progress Notes (Signed)
Patient presents to clinic today c/o few days of bilateral lower back pain wrapping around to her lower abdomen.  Symptoms initially noted Friday morning.  Describes as a slight discomfort, worsened with movement.  Noted symptoms seem to ease up later in the day Friday.  Was asymptomatic up until yesterday when symptoms recurred.  Denies nausea or vomiting.  Denies fever, chills or URI symptoms.  Denies change to bowel or bladder habits.  Denies any trauma or injury.  Does note lifting her great grandchildren and chasing after them.  Has not taken anything for her symptoms.  Past Medical History:  Diagnosis Date  . Allergy   . Anemia   . Depression   . Glaucoma   . Hyperlipidemia   . Hypertension     Current Outpatient Medications on File Prior to Visit  Medication Sig Dispense Refill  . aspirin 81 MG tablet Take 81 mg by mouth daily.      . cetirizine (ZYRTEC) 10 MG tablet Take 10 mg by mouth daily.    . dorzolamide (TRUSOPT) 2 % ophthalmic solution INSTILL ONE DROP INTO LEFT EYE TWICE DAILY  11  . erythromycin ophthalmic ointment APPLY TO RIGHT LOWER EYELID  1  . fenofibrate 160 MG tablet TAKE ONE TABLET BY MOUTH DAILY 90 tablet 0  . fluticasone (FLONASE) 50 MCG/ACT nasal spray Place 2 sprays into both nostrils daily. 16 g 5  . hydrochlorothiazide (HYDRODIURIL) 12.5 MG tablet TAKE ONE TABLET BY MOUTH DAILY 90 tablet 0  . latanoprost (XALATAN) 0.005 % ophthalmic solution Place 1 drop into the left eye daily.     Marland Kitchen levothyroxine (SYNTHROID) 88 MCG tablet TAKE ONE TABLET BY MOUTH DAILY 90 tablet 0  . naproxen (NAPROSYN) 250 MG tablet Take 2 tablet by mouth for the first dose, then one tablet by mouth every 8 hours for 5 days. 16 tablet 0  . olmesartan (BENICAR) 40 MG tablet TAKE ONE TABLET BY MOUTH DAILY 90 tablet 0  . omeprazole (PRILOSEC) 20 MG capsule TAKE ONE CAPSULE BY MOUTH DAILY 90 capsule 0  . PRED MILD 0.12 % ophthalmic suspension Place 1 drop into both eyes.     . Probiotic  Product (PROBIOTIC DAILY) CAPS Take by mouth.    . sertraline (ZOLOFT) 100 MG tablet TAKE ONE TABLET BY MOUTH DAILY (Patient taking differently: Take 50 mg by mouth daily. ) 30 tablet 3  . XIIDRA 5 % SOLN INSTILL DROPS INTO AFFECTED EYE(S) AS DIRECTED  3  . ferrous sulfate 325 (65 FE) MG tablet Take 1 tablet (325 mg total) by mouth daily with breakfast. (Patient not taking: Reported on 04/09/2019) 30 tablet 6   No current facility-administered medications on file prior to visit.    Allergies  Allergen Reactions  . Morphine And Related Hives  . Neurontin [Gabapentin]     hives    Family History  Problem Relation Age of Onset  . Cancer Mother   . Breast cancer Mother   . Cancer Father   . Pancreatic cancer Father   . Diabetes Sister     Social History   Socioeconomic History  . Marital status: Married    Spouse name: Not on file  . Number of children: Not on file  . Years of education: Not on file  . Highest education level: Not on file  Occupational History  . Not on file  Tobacco Use  . Smoking status: Former Smoker    Types: Cigarettes    Quit date: 01/04/1972  Years since quitting: 47.4  . Smokeless tobacco: Never Used  Substance and Sexual Activity  . Alcohol use: No  . Drug use: No  . Sexual activity: Not on file  Other Topics Concern  . Not on file  Social History Narrative  . Not on file   Social Determinants of Health   Financial Resource Strain:   . Difficulty of Paying Living Expenses:   Food Insecurity: No Food Insecurity  . Worried About Charity fundraiser in the Last Year: Never true  . Ran Out of Food in the Last Year: Never true  Transportation Needs: No Transportation Needs  . Lack of Transportation (Medical): No  . Lack of Transportation (Non-Medical): No  Physical Activity:   . Days of Exercise per Week:   . Minutes of Exercise per Session:   Stress:   . Feeling of Stress :   Social Connections:   . Frequency of Communication with  Friends and Family:   . Frequency of Social Gatherings with Friends and Family:   . Attends Religious Services:   . Active Member of Clubs or Organizations:   . Attends Archivist Meetings:   Marland Kitchen Marital Status:     Review of Systems - See HPI.  All other ROS are negative.  There were no vitals taken for this visit.  Physical Exam Vitals reviewed.  Constitutional:      Appearance: She is well-developed.  HENT:     Head: Normocephalic and atraumatic.  Cardiovascular:     Rate and Rhythm: Normal rate and regular rhythm.  Pulmonary:     Effort: Pulmonary effort is normal.     Breath sounds: Normal breath sounds.  Abdominal:     General: Abdomen is flat. Bowel sounds are normal. There is no distension.     Palpations: Abdomen is soft.     Tenderness: There is no abdominal tenderness.  Musculoskeletal:     Lumbar back: Tenderness present. No spasms or bony tenderness.     Comments: Pain of bilateral lower back with lateral bending, flexion and extension.   Neurological:     Mental Status: She is alert.     Recent Results (from the past 2160 hour(s))  CBC with Differential/Platelet     Status: Abnormal   Collection Time: 03/11/19  2:52 PM  Result Value Ref Range   WBC 5.2 4.0 - 10.5 K/uL   RBC 4.37 3.87 - 5.11 Mil/uL   Hemoglobin 11.6 (L) 12.0 - 15.0 g/dL   HCT 36.1 36.0 - 46.0 %   MCV 82.5 78.0 - 100.0 fl   MCHC 32.2 30.0 - 36.0 g/dL   RDW 15.9 (H) 11.5 - 15.5 %   Platelets 186.0 150.0 - 400.0 K/uL   Neutrophils Relative % 57.0 43.0 - 77.0 %   Lymphocytes Relative 29.5 12.0 - 46.0 %   Monocytes Relative 11.0 3.0 - 12.0 %   Eosinophils Relative 1.9 0.0 - 5.0 %   Basophils Relative 0.6 0.0 - 3.0 %   Neutro Abs 2.9 1.4 - 7.7 K/uL   Lymphs Abs 1.5 0.7 - 4.0 K/uL   Monocytes Absolute 0.6 0.1 - 1.0 K/uL   Eosinophils Absolute 0.1 0.0 - 0.7 K/uL   Basophils Absolute 0.0 0.0 - 0.1 K/uL  Hepatic function panel     Status: None   Collection Time: 03/11/19  2:52 PM    Result Value Ref Range   Total Bilirubin 0.3 0.2 - 1.2 mg/dL   Bilirubin, Direct 0.0 0.0 -  0.3 mg/dL   Alkaline Phosphatase 49 39 - 117 U/L   AST 14 0 - 37 U/L   ALT 8 0 - 35 U/L   Total Protein 6.8 6.0 - 8.3 g/dL   Albumin 4.1 3.5 - 5.2 g/dL  TSH     Status: None   Collection Time: 03/11/19  2:52 PM  Result Value Ref Range   TSH 2.71 0.35 - 4.50 uIU/mL  Basic metabolic panel     Status: Abnormal   Collection Time: 03/11/19  2:52 PM  Result Value Ref Range   Sodium 139 135 - 145 mEq/L   Potassium 4.4 3.5 - 5.1 mEq/L   Chloride 103 96 - 112 mEq/L   CO2 28 19 - 32 mEq/L   Glucose, Bld 78 70 - 99 mg/dL   BUN 34 (H) 6 - 23 mg/dL   Creatinine, Ser 1.36 (H) 0.40 - 1.20 mg/dL   GFR 37.03 (L) >60.00 mL/min   Calcium 9.4 8.4 - 10.5 mg/dL  Lipid panel     Status: Abnormal   Collection Time: 03/11/19  2:52 PM  Result Value Ref Range   Cholesterol 172 0 - 200 mg/dL    Comment: ATP III Classification       Desirable:  < 200 mg/dL               Borderline High:  200 - 239 mg/dL          High:  > = 240 mg/dL   Triglycerides 249.0 (H) 0.0 - 149.0 mg/dL    Comment: Normal:  <150 mg/dLBorderline High:  150 - 199 mg/dL   HDL 32.70 (L) >39.00 mg/dL   VLDL 49.8 (H) 0.0 - 40.0 mg/dL   Total CHOL/HDL Ratio 5     Comment:                Men          Women1/2 Average Risk     3.4          3.3Average Risk          5.0          4.42X Average Risk          9.6          7.13X Average Risk          15.0          11.0                       NonHDL 138.82     Comment: NOTE:  Non-HDL goal should be 30 mg/dL higher than patient's LDL goal (i.e. LDL goal of < 70 mg/dL, would have non-HDL goal of < 100 mg/dL)  LDL cholesterol, direct     Status: None   Collection Time: 03/11/19  2:52 PM  Result Value Ref Range   Direct LDL 107.0 mg/dL    Comment: Optimal:  <100 mg/dLNear or Above Optimal:  100-129 mg/dLBorderline High:  130-159 mg/dLHigh:  160-189 mg/dLVery High:  >190 mg/dL  Basic metabolic panel     Status:  Abnormal   Collection Time: 03/21/19 12:53 PM  Result Value Ref Range   Sodium 140 135 - 145 mEq/L   Potassium 3.9 3.5 - 5.1 mEq/L   Chloride 104 96 - 112 mEq/L   CO2 31 19 - 32 mEq/L   Glucose, Bld 107 (H) 70 - 99 mg/dL   BUN 23 6 - 23 mg/dL   Creatinine,  Ser 1.03 0.40 - 1.20 mg/dL   GFR 51.02 (L) >60.00 mL/min   Calcium 9.1 8.4 - 10.5 mg/dL    Assessment/Plan: 1. Acute bilateral low back pain without sciatica Atraumatic. Her abdominal pain seems MSK in nature as well. Pain in lower back with ROM, described as a pulling discomfort. No bony tenderness, CVA tenderness. No abdominal tenderness on exam. UA negative. Will check labs giving fatigue but it sounds like she has been extremely busy with her grandchildren, often lifting them up and taking full care of them. This is likely what cause MSK strain in lower back and abdominal wall. Rx Celebrex. Tylenol if needed. Supportive measures reviewed. Follow-up if not resolving, anything worsens or new symptoms develop. - POCT urinalysis dipstick - CBC with Differential/Platelet - Comp Met (CMET)  This visit occurred during the SARS-CoV-2 public health emergency.  Safety protocols were in place, including screening questions prior to the visit, additional usage of staff PPE, and extensive cleaning of exam room while observing appropriate contact time as indicated for disinfecting solutions.   Leeanne Rio, PA-C

## 2019-06-05 ENCOUNTER — Other Ambulatory Visit: Payer: Self-pay | Admitting: Emergency Medicine

## 2019-06-05 DIAGNOSIS — D509 Iron deficiency anemia, unspecified: Secondary | ICD-10-CM

## 2019-06-05 MED ORDER — FERROUS SULFATE 325 (65 FE) MG PO TABS: 325.0000 mg | ORAL_TABLET | Freq: Every day | ORAL | 6 refills | Status: DC

## 2019-06-06 ENCOUNTER — Other Ambulatory Visit: Payer: Self-pay | Admitting: Family Medicine

## 2019-06-12 ENCOUNTER — Other Ambulatory Visit: Payer: Self-pay | Admitting: Family Medicine

## 2019-06-12 NOTE — Telephone Encounter (Signed)
Pt called in asking if this could be taken care of today or tomorrow. Pt is leaving to go out of town on Friday and will be out of her medication. Please advise

## 2019-07-10 ENCOUNTER — Other Ambulatory Visit: Payer: Self-pay | Admitting: Family Medicine

## 2019-07-17 ENCOUNTER — Other Ambulatory Visit: Payer: Self-pay

## 2019-07-23 ENCOUNTER — Other Ambulatory Visit: Payer: Self-pay | Admitting: Family Medicine

## 2019-07-23 ENCOUNTER — Encounter: Payer: Self-pay | Admitting: Physician Assistant

## 2019-07-23 ENCOUNTER — Telehealth (INDEPENDENT_AMBULATORY_CARE_PROVIDER_SITE_OTHER): Payer: Medicare Other | Admitting: Physician Assistant

## 2019-07-23 ENCOUNTER — Other Ambulatory Visit: Payer: Self-pay

## 2019-07-23 DIAGNOSIS — R0981 Nasal congestion: Secondary | ICD-10-CM | POA: Diagnosis not present

## 2019-07-23 DIAGNOSIS — J302 Other seasonal allergic rhinitis: Secondary | ICD-10-CM

## 2019-07-23 MED ORDER — BENZONATATE 100 MG PO CAPS: 100.0000 mg | ORAL_CAPSULE | Freq: Three times a day (TID) | ORAL | 0 refills | Status: DC | PRN

## 2019-07-23 NOTE — Progress Notes (Signed)
Virtual Visit via Telephone Note  I connected with Samantha Briggs on 07/23/19 at 10:30 AM EDT by telephone and verified that I am speaking with the correct person using two identifiers.  Location: Patient: Home Provider: [facility] at [facility]   I discussed the limitations, risks, security and privacy concerns of performing an evaluation and management service by telephone and the availability of in person appointments. I also discussed with the patient that there may be a patient responsible charge related to this service. The patient expressed understanding and agreed to proceed.  History of Present Illness: Patient endorses nasal allergy symptoms starting on Saturday after going to a flower market. Notes she came home with nasal congestion and rhinorrhea, developing a cough over the next 24 hours. Notes significant post-nasal drip along with clear-yellow nasal congestion. Denies chest congestion, chest tightness or SOB.  Denies recent travel or sick contact..   Observations/Objective: No labored breathing.  Speech is clear and coherent with logical content.  Patient is alert and oriented at baseline.   Assessment and Plan: 1. Seasonal allergic rhinitis, unspecified trigger 2. Nasal congestion Will have patient continue her Nettie pot along with Flonase and daily Zyrtec.  We will add on plain Mucinex.  Rx Tessalon for cough.  Wear mask when outside to help avoid further trigger exposure.  Strict return precautions reviewed with patient who voiced understanding and agreement with plan. - benzonatate (TESSALON) 100 MG capsule; Take 1 capsule (100 mg total) by mouth 3 (three) times daily as needed for cough.  Dispense: 30 capsule; Refill: 0  Follow Up Instructions:  I discussed the assessment and treatment plan with the patient. The patient was provided an opportunity to ask questions and all were answered. The patient agreed with the plan and demonstrated an  understanding of the instructions.   The patient was advised to call back or seek an in-person evaluation if the symptoms worsen or if the condition fails to improve as anticipated.  I provided 10 minutes of non-face-to-face time during this encounter.   Piedad Climes, PA-C

## 2019-07-23 NOTE — Progress Notes (Signed)
I have discussed the procedure for the virtual visit with the patient who has given consent to proceed with assessment and treatment.   Shayann Garbutt S Alin Hutchins, CMA     

## 2019-07-25 ENCOUNTER — Telehealth: Payer: Self-pay | Admitting: Family Medicine

## 2019-07-25 NOTE — Telephone Encounter (Signed)
Patient called back and stated that the mucinex and the cough meds that [facility] had prescribed earlier in the week does not seem to be helping - still congested and coughing more.  Please call her on [phone]

## 2019-07-26 NOTE — Telephone Encounter (Signed)
Given her worsening symptoms and the fatigue (which is concerning) I would like her to be tested for COVID (we are seeing some breakthrough infections in vaccinated people).  In the meantime, continue the Zyrtec, Mucinex, and Tessalon and add either Delsym or Robitussin.  A steamy shower will also help.  If symptoms change or worsen- shortness of breath, dizziness, inability to eat or drink, fever- she needs to go to ER or UC over the weekend

## 2019-07-26 NOTE — Telephone Encounter (Signed)
Advised patient daughter Augusto Gamble of PCP recommendations-Covid testing, continuing the Zyrtec, Mucinex, Tessalon, and add on Delysm or Robitussin. If worsens over the weekend to go to ED or urgent care. She is agreeable.

## 2019-07-26 NOTE — Telephone Encounter (Signed)
For cough, switch to Delsym. Please verify she is taking the daily antihistamine as well as symptoms were most indicative of allergic inflammation at time of visit.

## 2019-07-26 NOTE — Telephone Encounter (Signed)
Spoke with patient daughter Augusto Gamble and states that patient cough is worsening. She is not getting any sputum up but the cough is harsh and deep. She states patient does not have a fever or chills, no sob or wheezing. She is very fatigued and decreased energy. She is taking her Zyrtec, Mucinex and Tessalon with no relief. Please advise since Selena Batten is out of the office

## 2019-07-27 DIAGNOSIS — Z20822 Contact with and (suspected) exposure to covid-19: Secondary | ICD-10-CM | POA: Diagnosis not present

## 2019-08-30 ENCOUNTER — Other Ambulatory Visit: Payer: Self-pay | Admitting: Family Medicine

## 2019-09-16 ENCOUNTER — Encounter: Payer: Self-pay | Admitting: Family Medicine

## 2019-09-16 ENCOUNTER — Other Ambulatory Visit: Payer: Self-pay

## 2019-09-16 ENCOUNTER — Ambulatory Visit (INDEPENDENT_AMBULATORY_CARE_PROVIDER_SITE_OTHER): Payer: Medicare Other | Admitting: Family Medicine

## 2019-09-16 VITALS — BP 133/80 | HR 78 | Temp 98.0°F | Resp 16 | Ht 67.0 in | Wt 178.1 lb

## 2019-09-16 DIAGNOSIS — I10 Essential (primary) hypertension: Secondary | ICD-10-CM | POA: Diagnosis not present

## 2019-09-16 DIAGNOSIS — Z0000 Encounter for general adult medical examination without abnormal findings: Secondary | ICD-10-CM | POA: Diagnosis not present

## 2019-09-16 DIAGNOSIS — Z23 Encounter for immunization: Secondary | ICD-10-CM

## 2019-09-16 LAB — HEPATIC FUNCTION PANEL
ALT: 9 U/L (ref 0–35)
AST: 15 U/L (ref 0–37)
Albumin: 4.5 g/dL (ref 3.5–5.2)
Alkaline Phosphatase: 40 U/L (ref 39–117)
Bilirubin, Direct: 0 mg/dL (ref 0.0–0.3)
Total Bilirubin: 0.4 mg/dL (ref 0.2–1.2)
Total Protein: 7 g/dL (ref 6.0–8.3)

## 2019-09-16 LAB — CBC WITH DIFFERENTIAL/PLATELET
Basophils Absolute: 0.1 10*3/uL (ref 0.0–0.1)
Basophils Relative: 1.5 % (ref 0.0–3.0)
Eosinophils Absolute: 0.1 10*3/uL (ref 0.0–0.7)
Eosinophils Relative: 1.7 % (ref 0.0–5.0)
HCT: 36.9 % (ref 36.0–46.0)
Hemoglobin: 11.9 g/dL — ABNORMAL LOW (ref 12.0–15.0)
Lymphocytes Relative: 22.9 % (ref 12.0–46.0)
Lymphs Abs: 0.8 10*3/uL (ref 0.7–4.0)
MCHC: 32.1 g/dL (ref 30.0–36.0)
MCV: 85.3 fl (ref 78.0–100.0)
Monocytes Absolute: 0.3 10*3/uL (ref 0.1–1.0)
Monocytes Relative: 7.2 % (ref 3.0–12.0)
Neutro Abs: 2.5 10*3/uL (ref 1.4–7.7)
Neutrophils Relative %: 66.7 % (ref 43.0–77.0)
Platelets: 171 10*3/uL (ref 150.0–400.0)
RBC: 4.33 Mil/uL (ref 3.87–5.11)
RDW: 15.3 % (ref 11.5–15.5)
WBC: 3.7 10*3/uL — ABNORMAL LOW (ref 4.0–10.5)

## 2019-09-16 LAB — BASIC METABOLIC PANEL
BUN: 23 mg/dL (ref 6–23)
CO2: 30 mEq/L (ref 19–32)
Calcium: 9.7 mg/dL (ref 8.4–10.5)
Chloride: 103 mEq/L (ref 96–112)
Creatinine, Ser: 0.99 mg/dL (ref 0.40–1.20)
GFR: 53.35 mL/min — ABNORMAL LOW (ref >60.00)
Glucose, Bld: 95 mg/dL (ref 70–99)
Potassium: 3.7 mEq/L (ref 3.5–5.1)
Sodium: 142 mEq/L (ref 135–145)

## 2019-09-16 LAB — LIPID PANEL
Cholesterol: 179 mg/dL (ref 0–200)
HDL: 37.1 mg/dL — ABNORMAL LOW (ref >39.00)
NonHDL: 141.66
Total CHOL/HDL Ratio: 5
Triglycerides: 236 mg/dL — ABNORMAL HIGH (ref 0.0–149.0)
VLDL: 47.2 mg/dL — ABNORMAL HIGH (ref 0.0–40.0)

## 2019-09-16 LAB — LDL CHOLESTEROL, DIRECT: Direct LDL: 115 mg/dL

## 2019-09-16 LAB — TSH: TSH: 3.58 u[IU]/mL (ref 0.35–4.50)

## 2019-09-16 NOTE — Assessment & Plan Note (Signed)
Chronic problem.  Adequate control.  Asymptomatic.  Check labs.  No anticipated med changes.  Will follow. 

## 2019-09-16 NOTE — Progress Notes (Signed)
   Subjective:    Patient ID: Samantha Briggs, female    DOB: 1932-11-29, 86 y.o.   MRN: [id number]  HPI CPE- no longer having mammos, colonoscopy, pap.  UTD on pneumonia vaccines, Tdap, COVID.  Due for flu.  Reviewed past medical, surgical, family and social histories.   Patient Care Team    Relationship Specialty Notifications Start End  Sheliah Hatch, MD PCP - General Family Medicine  10/07/10    Comment: Royetta Car, MD Consulting Physician Ophthalmology  09/06/18   [name], [facility] Pharmacist Pharmacist  04/02/19    Comment: phone number [phone]      Review of Systems Patient reports no vision/ hearing changes, adenopathy,fever, weight change,  persistant/recurrent hoarseness , swallowing issues, chest pain, palpitations, edema, persistant/recurrent cough, hemoptysis, dyspnea (rest/exertional/paroxysmal nocturnal), gastrointestinal bleeding (melena, rectal bleeding), abdominal pain, significant heartburn, bowel changes, GU symptoms (dysuria, hematuria, incontinence), Gyn symptoms (abnormal  bleeding, pain),  syncope, focal weakness, memory loss, numbness & tingling, skin/hair/nail changes, abnormal bruising or bleeding, anxiety, or depression.   This visit occurred during the SARS-CoV-2 public health emergency.  Safety protocols were in place, including screening questions prior to the visit, additional usage of staff PPE, and extensive cleaning of exam room while observing appropriate contact time as indicated for disinfecting solutions.       Objective:   Physical Exam General Appearance:    Alert, cooperative, no distress, appears stated age  Head:    Normocephalic, without obvious abnormality, atraumatic  Eyes:    PERRL, conjunctiva/corneas clear, EOM's intact, fundi    benign, both eyes  Ears:    Normal TM's and external ear canals, both ears  Nose:   Deferred due to COVID  Throat:   Neck:   Supple, symmetrical, trachea midline, no adenopathy;     Thyroid: no enlargement/tenderness/nodules  Back:     Symmetric, no curvature, ROM normal, no CVA tenderness  Lungs:     Clear to auscultation bilaterally, respirations unlabored  Chest Wall:    No tenderness or deformity   Heart:    Regular rate and rhythm, S1 and S2 normal, no murmur, rub   or gallop  Breast Exam:    Deferred  Abdomen:     Soft, non-tender, bowel sounds active all four quadrants,    no masses, no organomegaly  Genitalia:    Deferred  Rectal:    Extremities:   Extremities normal, atraumatic, no cyanosis or edema  Pulses:   2+ and symmetric all extremities  Skin:   Skin color, texture, turgor normal, no rashes or lesions  Lymph nodes:   Cervical, supraclavicular, and axillary nodes normal  Neurologic:   CNII-XII intact, normal strength, sensation and reflexes    throughout          Assessment & Plan:

## 2019-09-16 NOTE — Assessment & Plan Note (Signed)
Pt's PE WNL and unchanged from previous.  No longer doing mammograms or colonoscopy.  UTD on COVID, Tdap, pneumonia vaccines.  Flu shot given.  Check labs.  Anticipatory guidance provided.

## 2019-09-16 NOTE — Patient Instructions (Addendum)
Follow up in 6 months to recheck BP and cholesterol We'll notify you of your lab results and make any changes if needed Keep up the good work on healthy diet and exercise as able Get your 3rd shot when they become available Call with any questions or concerns Stay Safe!  Stay Healthy!

## 2019-09-17 ENCOUNTER — Telehealth: Payer: Self-pay | Admitting: Family Medicine

## 2019-09-17 ENCOUNTER — Encounter: Payer: Self-pay | Admitting: General Practice

## 2019-09-17 NOTE — Telephone Encounter (Signed)
Patient called and said that the date of her 3rd Covid shot was on 08/20/2019 - She got it at [facility] - store # [phone] - Lot # [id number] - [employer]

## 2019-09-17 NOTE — Telephone Encounter (Signed)
Chart updated to reflect 

## 2019-09-18 ENCOUNTER — Other Ambulatory Visit: Payer: Self-pay | Admitting: Family Medicine

## 2019-10-02 ENCOUNTER — Telehealth: Payer: Self-pay

## 2019-10-02 NOTE — Progress Notes (Signed)
Chronic Care Management Pharmacy Assistant   Name: Samantha Briggs  MRN: [id number] DOB: February 24, 1932  Reason for Encounter: Disease State   PCP : Sheliah Hatch, MD  Allergies:   Allergies  Allergen Reactions  . Morphine And Related Hives  . Neurontin [Gabapentin]     hives    Medications: Outpatient Encounter Medications as of 10/02/2019  Medication Sig Note  . aspirin 81 MG tablet Take 81 mg by mouth daily.     . benzonatate (TESSALON) 100 MG capsule Take 1 capsule (100 mg total) by mouth 3 (three) times daily as needed for cough.   . celecoxib (CELEBREX) 100 MG capsule Take 1 capsule (100 mg total) by mouth 2 (two) times daily.   . cetirizine (ZYRTEC) 10 MG tablet Take 10 mg by mouth daily.   . dorzolamide (TRUSOPT) 2 % ophthalmic solution INSTILL ONE DROP INTO LEFT EYE TWICE DAILY 09/05/2014: Received from: External Pharmacy  . erythromycin ophthalmic ointment APPLY TO RIGHT LOWER EYELID   . fenofibrate 160 MG tablet TAKE ONE TABLET BY MOUTH DAILY   . ferrous sulfate 325 (65 FE) MG tablet Take 1 tablet (325 mg total) by mouth daily with breakfast.   . fluticasone (FLONASE) 50 MCG/ACT nasal spray Place 2 sprays into both nostrils daily.   . hydrochlorothiazide (HYDRODIURIL) 12.5 MG tablet TAKE ONE TABLET BY MOUTH DAILY   . latanoprost (XALATAN) 0.005 % ophthalmic solution Place 1 drop into the left eye daily.    Marland Kitchen levothyroxine (SYNTHROID) 88 MCG tablet TAKE ONE TABLET BY MOUTH DAILY   . naproxen (NAPROSYN) 250 MG tablet Take 2 tablet by mouth for the first dose, then one tablet by mouth every 8 hours for 5 days.   Marland Kitchen olmesartan (BENICAR) 40 MG tablet TAKE ONE TABLET BY MOUTH DAILY   . omeprazole (PRILOSEC) 20 MG capsule TAKE ONE CAPSULE BY MOUTH DAILY   . PRED MILD 0.12 % ophthalmic suspension Place 1 drop into both eyes.    . Probiotic Product (PROBIOTIC DAILY) CAPS Take by mouth.   . sertraline (ZOLOFT) 100 MG tablet TAKE ONE TABLET BY MOUTH DAILY (Patient taking  differently: Take 50 mg by mouth daily. )   . XIIDRA 5 % SOLN INSTILL DROPS INTO AFFECTED EYE(S) AS DIRECTED    No facility-administered encounter medications on file as of 10/02/2019.    Current Diagnosis: Patient Active Problem List   Diagnosis Date Noted  . Overweight (BMI 25.0-29.9) 06/28/2018  . Hypothyroidism 04/21/2015  . Sciatica of right side 12/02/2014  . Anemia, iron deficiency 11/19/2013  . IBS (irritable bowel syndrome) 06/17/2013  . GERD (gastroesophageal reflux disease) 06/17/2013  . Hypertriglyceridemia 03/13/2013  . Routine general medical examination at a health care facility 09/19/2012  . Plantar fasciitis 08/17/2012  . Gout 08/17/2012  . Post herpetic neuralgia 06/02/2011  . Insomnia 05/18/2011  . HTN (hypertension) 10/17/2010  . Depression with anxiety 10/17/2010  . Glaucoma 10/17/2010    Reviewed chart prior to disease state call. Spoke with patient regarding BP  Recent Office Vitals: BP Readings from Last 3 Encounters:  09/16/19 133/80  06/04/19 131/81  03/07/19 121/67   Pulse Readings from Last 3 Encounters:  09/16/19 78  06/04/19 81  03/07/19 (!) 59    Wt Readings from Last 3 Encounters:  09/16/19 178 lb 2 oz (80.8 kg)  06/04/19 176 lb 8 oz (80.1 kg)  03/07/19 172 lb (78 kg)     Kidney Function Lab Results  Component Value Date/Time  CREATININE 0.99 09/16/2019 09:47 AM   CREATININE 0.96 06/04/2019 11:31 AM   CREATININE 0.67 09/05/2014 03:41 PM   GFR 53.35 (L) 09/16/2019 09:47 AM   GFRNONAA 61 (L) 06/17/2011 04:39 PM   GFRAA 70 (L) 06/17/2011 04:39 PM    BMP Latest Ref Rng & Units 09/16/2019 06/04/2019 03/21/2019  Glucose 70 - 99 mg/dL 95 93 409(W)  BUN 6 - 23 mg/dL 23 11(B) 23  Creatinine 0.40 - 1.20 mg/dL 1.47 8.29 5.62  Sodium 135 - 145 mEq/L 142 137 140  Potassium 3.5 - 5.1 mEq/L 3.7 4.2 3.9  Chloride 96 - 112 mEq/L 103 103 104  CO2 19 - 32 mEq/L 30 28 31   Calcium 8.4 - 10.5 mg/dL 9.7 9.4 9.1    . Current antihypertensive  regimen:  o Hydrochlorothiazide 12.5mg  tab Take one tab by mouth daily         . How often are you checking your Blood Pressure? daily . Current home BP readings: N/A . What recent interventions/DTPs have been made by any provider to improve Blood Pressure control since last CPP Visit: None noted . Any recent hospitalizations or ED visits since last visit with CPP? No . What diet changes have been made to improve Blood Pressure Control?  o N/A . What exercise is being done to improve your Blood Pressure Control?  o N/A  Adherence Review: Is the patient currently on ACE/ARB medication? No Does the patient have >5 day gap between last estimated fill dates? No   Samantha Briggs   Spoke with patient and rescheduled appointment from 10-10-19 to 11-08-19 at 3:30pm.    Follow-Up:  Pharmacist Review

## 2019-10-03 DIAGNOSIS — H401123 Primary open-angle glaucoma, left eye, severe stage: Secondary | ICD-10-CM | POA: Diagnosis not present

## 2019-10-07 ENCOUNTER — Telehealth: Payer: Medicare Other

## 2019-10-10 ENCOUNTER — Telehealth: Payer: Medicare Other

## 2019-10-15 ENCOUNTER — Other Ambulatory Visit: Payer: Self-pay | Admitting: Family Medicine

## 2019-11-07 ENCOUNTER — Other Ambulatory Visit: Payer: Self-pay | Admitting: Family Medicine

## 2019-11-08 ENCOUNTER — Telehealth: Payer: Medicare Other

## 2019-11-12 ENCOUNTER — Telehealth: Payer: Self-pay | Admitting: Family Medicine

## 2019-11-12 NOTE — Chronic Care Management (AMB) (Signed)
°  Chronic Care Management   Outreach Note  11/12/2019 Name: JASMON MATTICE MRN: [id number] DOB: Feb 08, 1932  Referred by: Sheliah Hatch, MD Reason for referral : No chief complaint on file.   An unsuccessful telephone outreach was attempted today. The patient was referred to the pharmacist for assistance with care management and care coordination.   Follow Up Plan:   Carmell [place] [employer] Scheduler

## 2019-11-29 ENCOUNTER — Other Ambulatory Visit: Payer: Self-pay | Admitting: Pulmonary Disease

## 2019-11-29 ENCOUNTER — Ambulatory Visit (INDEPENDENT_AMBULATORY_CARE_PROVIDER_SITE_OTHER): Payer: Medicare Other

## 2019-11-29 ENCOUNTER — Telehealth: Payer: Self-pay | Admitting: Pulmonary Disease

## 2019-11-29 DIAGNOSIS — S92355A Nondisplaced fracture of fifth metatarsal bone, left foot, initial encounter for closed fracture: Secondary | ICD-10-CM | POA: Diagnosis not present

## 2019-11-29 DIAGNOSIS — M79672 Pain in left foot: Secondary | ICD-10-CM | POA: Diagnosis not present

## 2019-11-29 DIAGNOSIS — M7989 Other specified soft tissue disorders: Secondary | ICD-10-CM | POA: Diagnosis not present

## 2019-11-29 DIAGNOSIS — Z87828 Personal history of other (healed) physical injury and trauma: Secondary | ICD-10-CM | POA: Diagnosis not present

## 2019-11-29 NOTE — Telephone Encounter (Signed)
11/29/2019  Assessed patient yesterday 11/28/2019 and had swollen left foot.  Twisted ankle at home.  Having some pain to palpation.  No visible bruising difficult to have full weightbearing.  Plan: We will order DG foot complete to further evaluate left foot pain

## 2019-11-29 NOTE — Progress Notes (Signed)
Called and notified family.  Will refer to orthopedics.  Family aware to contact [facility] on Monday.  Nothing further needed.Elisha Headland, FNP

## 2019-12-02 DIAGNOSIS — H527 Unspecified disorder of refraction: Secondary | ICD-10-CM | POA: Diagnosis not present

## 2019-12-02 DIAGNOSIS — M79672 Pain in left foot: Secondary | ICD-10-CM | POA: Diagnosis not present

## 2019-12-03 ENCOUNTER — Other Ambulatory Visit: Payer: Self-pay | Admitting: Family Medicine

## 2019-12-16 DIAGNOSIS — S92355D Nondisplaced fracture of fifth metatarsal bone, left foot, subsequent encounter for fracture with routine healing: Secondary | ICD-10-CM | POA: Diagnosis not present

## 2020-01-10 ENCOUNTER — Other Ambulatory Visit: Payer: Self-pay | Admitting: Family Medicine

## 2020-01-13 DIAGNOSIS — S92355D Nondisplaced fracture of fifth metatarsal bone, left foot, subsequent encounter for fracture with routine healing: Secondary | ICD-10-CM | POA: Diagnosis not present

## 2020-01-30 ENCOUNTER — Other Ambulatory Visit: Payer: Self-pay | Admitting: Family Medicine

## 2020-02-04 ENCOUNTER — Telehealth: Payer: Self-pay

## 2020-02-04 NOTE — Chronic Care Management (AMB) (Signed)
    Chronic Care Management Pharmacy Assistant   Name: Samantha Briggs  MRN: [id number] DOB: 1932/01/25  Patient's daughter declines to reschedule telephone appointment with [name], CPP.  April D Calhoun, [facility] Clinical Pharmacist Assistant [phone]

## 2020-03-10 ENCOUNTER — Other Ambulatory Visit: Payer: Self-pay | Admitting: Family Medicine

## 2020-03-10 ENCOUNTER — Other Ambulatory Visit: Payer: Self-pay | Admitting: Emergency Medicine

## 2020-03-10 DIAGNOSIS — F418 Other specified anxiety disorders: Secondary | ICD-10-CM

## 2020-03-10 MED ORDER — SERTRALINE HCL 100 MG PO TABS: 100.0000 mg | ORAL_TABLET | Freq: Every day | ORAL | 1 refills | Status: DC

## 2020-03-17 ENCOUNTER — Encounter: Payer: Self-pay | Admitting: Family Medicine

## 2020-03-17 ENCOUNTER — Ambulatory Visit (INDEPENDENT_AMBULATORY_CARE_PROVIDER_SITE_OTHER): Payer: Medicare Other | Admitting: Family Medicine

## 2020-03-17 ENCOUNTER — Other Ambulatory Visit: Payer: Self-pay

## 2020-03-17 VITALS — BP 122/80 | HR 82 | Temp 97.0°F | Resp 16 | Ht 67.0 in | Wt 180.8 lb

## 2020-03-17 DIAGNOSIS — I10 Essential (primary) hypertension: Secondary | ICD-10-CM

## 2020-03-17 DIAGNOSIS — E781 Pure hyperglyceridemia: Secondary | ICD-10-CM

## 2020-03-17 DIAGNOSIS — E038 Other specified hypothyroidism: Secondary | ICD-10-CM | POA: Diagnosis not present

## 2020-03-17 LAB — HEPATIC FUNCTION PANEL
ALT: 9 U/L (ref 0–35)
AST: 16 U/L (ref 0–37)
Albumin: 3.9 g/dL (ref 3.5–5.2)
Alkaline Phosphatase: 45 U/L (ref 39–117)
Bilirubin, Direct: 0 mg/dL (ref 0.0–0.3)
Total Bilirubin: 0.3 mg/dL (ref 0.2–1.2)
Total Protein: 6.8 g/dL (ref 6.0–8.3)

## 2020-03-17 LAB — CBC WITH DIFFERENTIAL/PLATELET
Basophils Absolute: 0 10*3/uL (ref 0.0–0.1)
Basophils Relative: 0.4 % (ref 0.0–3.0)
Eosinophils Absolute: 0.1 10*3/uL (ref 0.0–0.7)
Eosinophils Relative: 1.7 % (ref 0.0–5.0)
HCT: 35.9 % — ABNORMAL LOW (ref 36.0–46.0)
Hemoglobin: 11.8 g/dL — ABNORMAL LOW (ref 12.0–15.0)
Lymphocytes Relative: 22.7 % (ref 12.0–46.0)
Lymphs Abs: 1 10*3/uL (ref 0.7–4.0)
MCHC: 32.9 g/dL (ref 30.0–36.0)
MCV: 83 fl (ref 78.0–100.0)
Monocytes Absolute: 0.3 10*3/uL (ref 0.1–1.0)
Monocytes Relative: 7.7 % (ref 3.0–12.0)
Neutro Abs: 2.9 10*3/uL (ref 1.4–7.7)
Neutrophils Relative %: 67.5 % (ref 43.0–77.0)
Platelets: 165 10*3/uL (ref 150.0–400.0)
RBC: 4.32 Mil/uL (ref 3.87–5.11)
RDW: 15.4 % (ref 11.5–15.5)
WBC: 4.3 10*3/uL (ref 4.0–10.5)

## 2020-03-17 LAB — BASIC METABOLIC PANEL
BUN: 28 mg/dL — ABNORMAL HIGH (ref 6–23)
CO2: 28 mEq/L (ref 19–32)
Calcium: 9.5 mg/dL (ref 8.4–10.5)
Chloride: 104 mEq/L (ref 96–112)
Creatinine, Ser: 0.97 mg/dL (ref 0.40–1.20)
GFR: 53.39 mL/min — ABNORMAL LOW (ref >60.00)
Glucose, Bld: 86 mg/dL (ref 70–99)
Potassium: 4 mEq/L (ref 3.5–5.1)
Sodium: 138 mEq/L (ref 135–145)

## 2020-03-17 LAB — LIPID PANEL
Cholesterol: 168 mg/dL (ref 0–200)
HDL: 36.4 mg/dL — ABNORMAL LOW (ref >39.00)
LDL Cholesterol: 92 mg/dL (ref 0–99)
NonHDL: 131.84
Total CHOL/HDL Ratio: 5
Triglycerides: 197 mg/dL — ABNORMAL HIGH (ref 0.0–149.0)
VLDL: 39.4 mg/dL (ref 0.0–40.0)

## 2020-03-17 LAB — TSH: TSH: 3.11 u[IU]/mL (ref 0.35–4.50)

## 2020-03-17 NOTE — Progress Notes (Signed)
   Subjective:    Patient ID: Samantha Briggs, female    DOB: 12-04-1932, 87 y.o.   MRN: [id number]  HPI HTN- chronic problem, on HCTZ 12.5mg  daily, Olmesartan 40mg  daily w/ good control.  No CP, SOB unless going up stairs, HAs, edema.  Hypertriglyceridemia- chronic problem, on Fenofibrate 160mg  daily.  No abd pain, N/V.  Pt is very active w/ her great grandchildren  Hypothyroid- chronic problem, on Levothyroxine daily.  No change in energy level.  Denies changes to skin/hair/nails   Review of Systems For ROS see HPI   This visit occurred during the SARS-CoV-2 public health emergency.  Safety protocols were in place, including screening questions prior to the visit, additional usage of staff PPE, and extensive cleaning of exam room while observing appropriate contact time as indicated for disinfecting solutions.       Objective:   Physical Exam Vitals reviewed.  Constitutional:      General: She is not in acute distress.    Appearance: Normal appearance. She is well-developed. She is not ill-appearing.  HENT:     Head: Normocephalic and atraumatic.  Eyes:     Conjunctiva/sclera: Conjunctivae normal.     Pupils: Pupils are equal, round, and reactive to light.  Neck:     Thyroid: No thyromegaly.  Cardiovascular:     Rate and Rhythm: Normal rate and regular rhythm.     Pulses: Normal pulses.     Heart sounds: Normal heart sounds. No murmur heard.   Pulmonary:     Effort: Pulmonary effort is normal. No respiratory distress.     Breath sounds: Normal breath sounds.  Abdominal:     General: There is no distension.     Palpations: Abdomen is soft.     Tenderness: There is no abdominal tenderness.  Musculoskeletal:     Cervical back: Normal range of motion and neck supple.     Right lower leg: No edema.     Left lower leg: No edema.  Lymphadenopathy:     Cervical: No cervical adenopathy.  Skin:    General: Skin is warm and dry.  Neurological:     Mental Status:  She is alert and oriented to person, place, and time.  Psychiatric:        Behavior: Behavior normal.           Assessment & Plan:

## 2020-03-17 NOTE — Assessment & Plan Note (Signed)
Chronic problem.  Good control on HCTZ 12.5mg  daily and Olmesartan 40mg  daily.  Check labs.  No anticipated med changes.

## 2020-03-17 NOTE — Assessment & Plan Note (Signed)
Chronic problem.  Pt reports sxs are stable on Levothyroxine daily.  Check labs.  Adjust meds prn

## 2020-03-17 NOTE — Assessment & Plan Note (Signed)
Chronic problem.  Tolerating Fenofibrate w/o difficulty.  Check labs.  Adjust meds prn  °

## 2020-03-17 NOTE — Patient Instructions (Signed)
Schedule your complete physical in 6 months We'll notify you of your lab results and make any changes if needed Continue to work on healthy diet and regular exercise- you look great! Vit D and a multivitamin daily is all you need! No need for probiotics unless you are having issues with your bowels Call with any questions or concerns Stay Safe!  Stay Healthy! Happy Spring!!!

## 2020-04-03 ENCOUNTER — Encounter: Payer: Self-pay | Admitting: Family Medicine

## 2020-04-03 ENCOUNTER — Telehealth (INDEPENDENT_AMBULATORY_CARE_PROVIDER_SITE_OTHER): Payer: Medicare Other | Admitting: Family Medicine

## 2020-04-03 VITALS — Ht 67.0 in | Wt 171.0 lb

## 2020-04-03 DIAGNOSIS — M10072 Idiopathic gout, left ankle and foot: Secondary | ICD-10-CM

## 2020-04-03 MED ORDER — PREDNISONE 10 MG PO TABS: ORAL_TABLET | ORAL | 0 refills | Status: AC

## 2020-04-03 NOTE — Progress Notes (Signed)
[facility] [address] [place] [address] Dept: [phone] Dept Fax: [phone]  Virtual Video Visit  I connected with Samantha Briggs on 04/03/20 at  4:00 PM EDT by a video enabled telemedicine application and verified that I am speaking with the correct person using two identifiers.  Location patient: Home Location provider: Clinic Persons participating in the virtual visit: Patient, Provider  I discussed the limitations of evaluation and management by telemedicine and the availability of in person appointments. The patient expressed understanding and agreed to proceed.  Chief Complaint  Patient presents with  . Acute Visit    C/o LT big toe is swollen, painful x 1 day.  She has taken Ibuprofen.     SUBJECTIVE:  HPI: Samantha Briggs is a 87 y.o. female who presents with with a 2 -day history of a swollen tender left great toe. She states this does feel like previous episodes she has had of gout. She did take some ibuprofen overnight, which provided some relief, but did not resolve the pain.  Patient Active Problem List   Diagnosis Date Noted  . Left foot pain 11/29/2019  . Closed nondisplaced fracture of fifth left metatarsal bone 11/29/2019  . Overweight (BMI 25.0-29.9) 06/28/2018  . Hypothyroidism 04/21/2015  . Sciatica of right side 12/02/2014  . Anemia, iron deficiency 11/19/2013  . IBS (irritable bowel syndrome) 06/17/2013  . GERD (gastroesophageal reflux disease) 06/17/2013  . Hypertriglyceridemia 03/13/2013  . Routine general medical examination at a health care facility 09/19/2012  . Plantar fasciitis 08/17/2012  . Gout 08/17/2012  . Post herpetic neuralgia 06/02/2011  . Insomnia 05/18/2011  . HTN (hypertension) 10/17/2010  . Depression with anxiety 10/17/2010  . Glaucoma 10/17/2010   Past Surgical History:  Procedure Laterality Date  . CHOLECYSTECTOMY    . CORNEAL TRANSPLANT     x2   . DILATION AND CURETTAGE OF UTERUS    . RETINAL DETACHMENT SURGERY     Family History  Problem Relation Age of Onset  . Cancer Mother   . Breast cancer Mother   . Cancer Father   . Pancreatic cancer Father   . Diabetes Sister    Social History   Tobacco Use  . Smoking status: Former Smoker    Types: Cigarettes    Quit date: 01/04/1972    Years since quitting: 48.2  . Smokeless tobacco: Never Used  Vaping Use  . Vaping Use: Never used  Substance Use Topics  . Alcohol use: No  . Drug use: No    Current Outpatient Medications:  .  cetirizine (ZYRTEC) 10 MG tablet, Take 10 mg by mouth daily., Disp: , Rfl:  .  dorzolamide (TRUSOPT) 2 % ophthalmic solution, INSTILL ONE DROP INTO LEFT EYE TWICE DAILY, Disp: , Rfl: 11 .  fenofibrate 160 MG tablet, TAKE ONE TABLET BY MOUTH DAILY, Disp: 90 tablet, Rfl: 0 .  ferrous sulfate 325 (65 FE) MG tablet, Take 1 tablet (325 mg total) by mouth daily with breakfast., Disp: 30 tablet, Rfl: 6 .  fluticasone (FLONASE) 50 MCG/ACT nasal spray, PLACE TWO SPRAYS INTO BOTH NOSTRILS DAILY, Disp: 16 mL, Rfl: 4 .  hydrochlorothiazide (HYDRODIURIL) 12.5 MG tablet, TAKE ONE TABLET BY MOUTH DAILY, Disp: 90 tablet, Rfl: 0 .  latanoprost (XALATAN) 0.005 % ophthalmic solution, Place 1 drop into the left eye daily. , Disp: , Rfl:  .  levothyroxine (SYNTHROID) 88 MCG tablet, TAKE ONE TABLET BY MOUTH DAILY, Disp: 90 tablet, Rfl: 0 .  olmesartan (BENICAR) 40 MG tablet, TAKE ONE TABLET BY MOUTH DAILY, Disp: 90 tablet, Rfl: 0 .  omeprazole (PRILOSEC) 20 MG capsule, TAKE ONE CAPSULE BY MOUTH DAILY, Disp: 90 capsule, Rfl: 0 .  PRED MILD 0.12 % ophthalmic suspension, Place 1 drop into both eyes. , Disp: , Rfl:  .  sertraline (ZOLOFT) 100 MG tablet, Take 1 tablet (100 mg total) by mouth daily., Disp: 90 tablet, Rfl: 1 .  erythromycin ophthalmic ointment, APPLY TO RIGHT LOWER EYELID (Patient not taking: Reported on 04/03/2020), Disp: , Rfl: 1 .  XIIDRA 5 % SOLN, INSTILL DROPS  INTO AFFECTED EYE(S) AS DIRECTED (Patient not taking: Reported on 04/03/2020), Disp: , Rfl: 3  Allergies  Allergen Reactions  . Morphine And Related Hives  . Neurontin [Gabapentin]     hives    ROS: See pertinent positives and negatives per HPI.  OBSERVATIONS/OBJECTIVE:  VITALS per patient if applicable: Today's Vitals   04/03/20 1610  Weight: 171 lb (77.6 kg)  Height: 5\' 7"  (1.702 m)   Body mass index is 26.78 kg/m.   GENERAL: Alert and oriented. Appears well and in no acute distress.  Foot: The left 1st MCP is moderately swollen with redness.  PSYCH/NEURO: Pleasant and cooperative. No obvious depression or anxiety. Speech and thought processing grossly intact.  ASSESSMENT AND PLAN:  1. Acute idiopathic gout of left foot I reviewed prior ER and office records from her first episode of gout in 2020. She was treated with a tapering dose of prednisone and recalls this helped to resolve her issue. I will prescribe a similar steroid taper. She may continue to use ibuprofen PRN.  - predniSONE (DELTASONE) 10 MG tablet; Take 4 tablets (40 mg total) by mouth daily with breakfast for 2 days, THEN 3 tablets (30 mg total) daily with breakfast for 2 days, THEN 2 tablets (20 mg total) daily with breakfast for 2 days, THEN 1 tablet (10 mg total) daily with breakfast for 2 days.  Dispense: 20 tablet; Refill: 0  I discussed the assessment and treatment plan with the patient. The patient was provided an opportunity to ask questions and all were answered. The patient agreed with the plan and demonstrated an understanding of the instructions.   The patient was advised to call back or seek an in-person evaluation if the symptoms worsen or if the condition fails to improve as anticipated.   2021, MD

## 2020-04-03 NOTE — Patient Instructions (Signed)
Gout  Gout is a condition that causes painful swelling of the joints. Gout is a type of inflammation of the joints (arthritis). This condition is caused by having too much uric acid in the body. Uric acid is a chemical that forms when the body breaks down substances called purines. Purines are important for building body proteins. When the body has too much uric acid, sharp crystals can form and build up inside the joints. This causes pain and swelling. Gout attacks can happen quickly and may be very painful (acute gout). Over time, the attacks can affect more joints and become more frequent (chronic gout). Gout can also cause uric acid to build up under the skin and inside the kidneys. What are the causes? This condition is caused by too much uric acid in your blood. This can happen because:  Your kidneys do not remove enough uric acid from your blood. This is the most common cause.  Your body makes too much uric acid. This can happen with some cancers and cancer treatments. It can also occur if your body is breaking down too many red blood cells (hemolytic anemia).  You eat too many foods that are high in purines. These foods include organ meats and some seafood. Alcohol, especially beer, is also high in purines. A gout attack may be triggered by trauma or stress. What increases the risk? You are more likely to develop this condition if you:  Have a family history of gout.  Are female and middle-aged.  Are female and have gone through menopause.  Are obese.  Frequently drink alcohol, especially beer.  Are dehydrated.  Lose weight too quickly.  Have an organ transplant.  Have lead poisoning.  Take certain medicines, including aspirin, cyclosporine, diuretics, levodopa, and niacin.  Have kidney disease.  Have a skin condition called psoriasis. What are the signs or symptoms? An attack of acute gout happens quickly. It usually occurs in just one joint. The most common place is  the big toe. Attacks often start at night. Other joints that may be affected include joints of the feet, ankle, knee, fingers, wrist, or elbow. Symptoms of this condition may include:  Severe pain.  Warmth.  Swelling.  Stiffness.  Tenderness. The affected joint may be very painful to touch.  Shiny, red, or purple skin.  Chills and fever. Chronic gout may cause symptoms more frequently. More joints may be involved. You may also have white or yellow lumps (tophi) on your hands or feet or in other areas near your joints.   How is this diagnosed? This condition is diagnosed based on your symptoms, medical history, and physical exam. You may have tests, such as:  Blood tests to measure uric acid levels.  Removal of joint fluid with a thin needle (aspiration) to look for uric acid crystals.  X-rays to look for joint damage. How is this treated? Treatment for this condition has two phases: treating an acute attack and preventing future attacks. Acute gout treatment may include medicines to reduce pain and swelling, including:  NSAIDs.  Steroids. These are strong anti-inflammatory medicines that can be taken by mouth (orally) or injected into a joint.  Colchicine. This medicine relieves pain and swelling when it is taken soon after an attack. It can be given by mouth or through an IV. Preventive treatment may include:  Daily use of smaller doses of NSAIDs or colchicine.  Use of a medicine that reduces uric acid levels in your blood.  Changes to your diet.   You may need to see a dietitian about what to eat and drink to prevent gout. Follow these instructions at home: During a gout attack  If directed, put ice on the affected area: ? Put ice in a plastic bag. ? Place a towel between your skin and the bag. ? Leave the ice on for 20 minutes, 2-3 times a day.  Raise (elevate) the affected joint above the level of your heart as often as possible.  Rest the joint as much as possible.  If the affected joint is in your leg, you may be given crutches to use.  Follow instructions from your health care provider about eating or drinking restrictions.   Avoiding future gout attacks  Follow a low-purine diet as told by your dietitian or health care provider. Avoid foods and drinks that are high in purines, including liver, kidney, anchovies, asparagus, herring, mushrooms, mussels, and beer.  Maintain a healthy weight or lose weight if you are overweight. If you want to lose weight, talk with your health care provider. It is important that you do not lose weight too quickly.  Start or maintain an exercise program as told by your health care provider. Eating and drinking  Drink enough fluids to keep your urine pale yellow.  If you drink alcohol: ? Limit how much you use to:  0-1 drink a day for women.  0-2 drinks a day for men. ? Be aware of how much alcohol is in your drink. In the [place] one drink equals one 12 oz bottle of beer (355 mL) one 5 oz glass of wine (148 mL), or one 1 oz glass of hard liquor (44 mL). General instructions  Take over-the-counter and prescription medicines only as told by your health care provider.  Do not drive or use heavy machinery while taking prescription pain medicine.  Return to your normal activities as told by your health care provider. Ask your health care provider what activities are safe for you.  Keep all follow-up visits as told by your health care provider. This is important. Contact a health care provider if you have:  Another gout attack.  Continuing symptoms of a gout attack after 10 days of treatment.  Side effects from your medicines.  Chills or a fever.  Burning pain when you urinate.  Pain in your lower back or belly. Get help right away if you:  Have severe or uncontrolled pain.  Cannot urinate. Summary  Gout is painful swelling of the joints caused by inflammation.  The most common site of pain is the big  toe, but it can affect other joints in the body.  Medicines and dietary changes can help to prevent and treat gout attacks. This information is not intended to replace advice given to you by your health care provider. Make sure you discuss any questions you have with your health care provider. Document Revised: 07/12/2017 Document Reviewed: 07/12/2017 Elsevier Patient Education  2021 [employer]  

## 2020-04-09 ENCOUNTER — Other Ambulatory Visit: Payer: Self-pay | Admitting: Family Medicine

## 2020-05-06 ENCOUNTER — Other Ambulatory Visit: Payer: Self-pay | Admitting: Family Medicine

## 2020-06-06 ENCOUNTER — Other Ambulatory Visit: Payer: Self-pay | Admitting: Family Medicine

## 2020-06-17 ENCOUNTER — Other Ambulatory Visit: Payer: Self-pay | Admitting: Family Medicine

## 2020-07-01 ENCOUNTER — Encounter: Payer: Self-pay | Admitting: *Deleted

## 2020-07-14 ENCOUNTER — Encounter: Payer: Self-pay | Admitting: Registered Nurse

## 2020-07-14 ENCOUNTER — Other Ambulatory Visit: Payer: Self-pay

## 2020-07-14 ENCOUNTER — Other Ambulatory Visit: Payer: Self-pay | Admitting: Family Medicine

## 2020-07-14 ENCOUNTER — Telehealth (INDEPENDENT_AMBULATORY_CARE_PROVIDER_SITE_OTHER): Payer: Medicare Other | Admitting: Registered Nurse

## 2020-07-14 DIAGNOSIS — U071 COVID-19: Secondary | ICD-10-CM | POA: Diagnosis not present

## 2020-07-14 MED ORDER — DM-GUAIFENESIN ER 30-600 MG PO TB12: 1.0000 | ORAL_TABLET | Freq: Two times a day (BID) | ORAL | 0 refills | Status: DC

## 2020-07-14 MED ORDER — MOLNUPIRAVIR EUA 200MG CAPSULE: 4.0000 | ORAL_CAPSULE | Freq: Two times a day (BID) | ORAL | 0 refills | Status: AC

## 2020-07-14 NOTE — Progress Notes (Signed)
Telemedicine Encounter- SOAP NOTE Established Patient  This telephone encounter was conducted with the patient's (or proxy's) verbal consent via audio telecommunications: yes/no: Yes Patient was instructed to have this encounter in a suitably private space; and to only have persons present to whom they give permission to participate. In addition, patient identity was confirmed by use of name plus two identifiers (DOB and address).  I discussed the limitations, risks, security and privacy concerns of performing an evaluation and management service by telephone and the availability of in person appointments. I also discussed with the patient that there may be a patient responsible charge related to this service. The patient expressed understanding and agreed to proceed.  I spent a total of 14 minutes talking with the patient or their proxy.  Patient at home Provider in office  Participants: Samantha Sportsman, NP and Samantha Briggs, and daughter Lennox Laity  Chief Complaint  Patient presents with   Covid Positive    Patient is experiencing a cough, headache, congestion, and fatigue since Sunday. Patient tested positive for COVID this morning.    Subjective   NYRIAH Briggs is a 87 y.o. established patient. Telephone visit today for COVID +  HPI Symptoms onset Sunday - Tested with home test this morning, positive  Notes symptoms include cough, headache, nasal and sinus congestion, and fatigue. Symptoms stable since onset.   Vaccinated x 2 boosted x1  Denies shob, doe, chest pain, nvd, fevers, chills, sweats, lightheadedness, dizziness.  Patient Active Problem List   Diagnosis Date Noted   Left foot pain 11/29/2019   Closed nondisplaced fracture of fifth left metatarsal bone 11/29/2019   Overweight (BMI 25.0-29.9) 06/28/2018   Hypothyroidism 04/21/2015   Sciatica of right side 12/02/2014   Anemia, iron deficiency 11/19/2013   IBS (irritable bowel syndrome) 06/17/2013   GERD  (gastroesophageal reflux disease) 06/17/2013   Hypertriglyceridemia 03/13/2013   Routine general medical examination at a health care facility 09/19/2012   Plantar fasciitis 08/17/2012   Gout 08/17/2012   Post herpetic neuralgia 06/02/2011   Insomnia 05/18/2011   HTN (hypertension) 10/17/2010   Depression with anxiety 10/17/2010   Glaucoma 10/17/2010    Past Medical History:  Diagnosis Date   Allergy    Anemia    Depression    Glaucoma    Hyperlipidemia    Hypertension     Current Outpatient Medications  Medication Sig Dispense Refill   cetirizine (ZYRTEC) 10 MG tablet Take 10 mg by mouth daily.     dextromethorphan-guaiFENesin (MUCINEX DM) 30-600 MG 12hr tablet Take 1 tablet by mouth 2 (two) times daily. 20 tablet 0   dorzolamide (TRUSOPT) 2 % ophthalmic solution INSTILL ONE DROP INTO LEFT EYE TWICE DAILY  11   erythromycin ophthalmic ointment   1   fenofibrate 160 MG tablet TAKE ONE TABLET BY MOUTH DAILY 90 tablet 0   ferrous sulfate 325 (65 FE) MG tablet Take 1 tablet (325 mg total) by mouth daily with breakfast. 30 tablet 6   fluticasone (FLONASE) 50 MCG/ACT nasal spray PLACE TWO SPRAYS INTO BOTH NOSTRILS DAILY 16 mL 4   hydrochlorothiazide (HYDRODIURIL) 12.5 MG tablet TAKE ONE TABLET BY MOUTH DAILY 90 tablet 0   latanoprost (XALATAN) 0.005 % ophthalmic solution Place 1 drop into the left eye daily.      levothyroxine (SYNTHROID) 88 MCG tablet TAKE ONE TABLET BY MOUTH DAILY 90 tablet 0   molnupiravir EUA 200 mg CAPS Take 4 capsules (800 mg total) by mouth 2 (two) times  daily for 5 days. 40 capsule 0   olmesartan (BENICAR) 40 MG tablet TAKE ONE TABLET BY MOUTH DAILY 90 tablet 0   omeprazole (PRILOSEC) 20 MG capsule TAKE ONE CAPSULE BY MOUTH DAILY 90 capsule 0   PRED MILD 0.12 % ophthalmic suspension Place 1 drop into both eyes.      sertraline (ZOLOFT) 100 MG tablet Take 1 tablet (100 mg total) by mouth daily. 90 tablet 1   XIIDRA 5 % SOLN   3   No current  facility-administered medications for this visit.    Allergies  Allergen Reactions   Morphine And Related Hives   Neurontin [Gabapentin]     hives    Social History   Socioeconomic History   Marital status: Married    Spouse name: Not on file   Number of children: Not on file   Years of education: Not on file   Highest education level: Not on file  Occupational History   Not on file  Tobacco Use   Smoking status: Former    Pack years: 0.00    Types: Cigarettes    Quit date: 01/04/1972    Years since quitting: 48.5   Smokeless tobacco: Never  Vaping Use   Vaping Use: Never used  Substance and Sexual Activity   Alcohol use: No   Drug use: No   Sexual activity: Not on file  Other Topics Concern   Not on file  Social History Narrative   Not on file   Social Determinants of Health   Financial Resource Strain: Not on file  Food Insecurity: Not on file  Transportation Needs: Not on file  Physical Activity: Not on file  Stress: Not on file  Social Connections: Not on file  Intimate Partner Violence: Not on file    Review of Systems  Constitutional:  Positive for malaise/fatigue. Negative for chills, diaphoresis, fever and weight loss.  HENT:  Positive for congestion. Negative for ear discharge, ear pain, hearing loss, nosebleeds, sinus pain, sore throat and tinnitus.   Eyes: Negative.   Respiratory:  Positive for cough. Negative for hemoptysis, sputum production, shortness of breath, wheezing and stridor.   Cardiovascular: Negative.   Gastrointestinal: Negative.   Genitourinary: Negative.   Musculoskeletal: Negative.   Skin: Negative.   Neurological:  Positive for headaches. Negative for dizziness, tingling, tremors, sensory change, speech change, focal weakness, seizures, loss of consciousness and weakness.  Endo/Heme/Allergies: Negative.   Psychiatric/Behavioral: Negative.    All other systems reviewed and are negative.  Objective   Vitals as reported by the  patient: There were no vitals filed for this visit.  Samantha Briggs was seen today for covid positive.  Diagnoses and all orders for this visit:  COVID-19 -     molnupiravir EUA 200 mg CAPS; Take 4 capsules (800 mg total) by mouth 2 (two) times daily for 5 days. -     dextromethorphan-guaiFENesin (MUCINEX DM) 30-600 MG 12hr tablet; Take 1 tablet by mouth 2 (two) times daily.   PLAN Discussed risks, benefits, and alternatives to antivirals and will proceed. Molnupiravir as above Supportive care: mucinex dm once or twice daily as needed. Discussed nonpharm and encouraged adequate hydration and regular meals, as well as deep breathing exercises. Reviewed ER precautions with patient and daughter who both voiced understanding. Patient encouraged to call clinic with any questions, comments, or concerns.  I discussed the assessment and treatment plan with the patient. The patient was provided an opportunity to ask questions and all were answered.  The patient agreed with the plan and demonstrated an understanding of the instructions.   The patient was advised to call back or seek an in-person evaluation if the symptoms worsen or if the condition fails to improve as anticipated.  I provided 14 minutes of non-face-to-face time during this encounter.  Janeece Agee, NP  Primary Care at [facility]

## 2020-07-14 NOTE — Patient Instructions (Signed)
° ° ° °  If you have lab work done today you will be contacted with your lab results within the next 2 weeks.  If you have not heard from us then please contact us. The fastest way to get your results is to register for My Chart. ° ° °IF you received an x-ray today, you will receive an invoice from [facility]. Please contact [facility] at [phone] with questions or concerns regarding your invoice.  ° °IF you received labwork today, you will receive an invoice from [facility]. Please contact [facility] at [phone] with questions or concerns regarding your invoice.  ° °Our billing staff will not be able to assist you with questions regarding bills from these companies. ° °You will be contacted with the lab results as soon as they are available. The fastest way to get your results is to activate your My Chart account. Instructions are located on the last page of this paperwork. If you have not heard from us regarding the results in 2 weeks, please contact this office. °  ° ° ° °

## 2020-08-07 ENCOUNTER — Other Ambulatory Visit: Payer: Self-pay | Admitting: Family Medicine

## 2020-08-10 ENCOUNTER — Telehealth: Payer: Self-pay | Admitting: Family Medicine

## 2020-08-10 ENCOUNTER — Encounter: Payer: Self-pay | Admitting: Family Medicine

## 2020-08-10 ENCOUNTER — Ambulatory Visit (INDEPENDENT_AMBULATORY_CARE_PROVIDER_SITE_OTHER): Payer: Medicare Other | Admitting: Family Medicine

## 2020-08-10 ENCOUNTER — Other Ambulatory Visit: Payer: Self-pay

## 2020-08-10 VITALS — BP 158/78 | HR 87 | Temp 97.3°F | Ht 67.0 in | Wt 188.6 lb

## 2020-08-10 DIAGNOSIS — H6123 Impacted cerumen, bilateral: Secondary | ICD-10-CM

## 2020-08-10 MED ORDER — DEBROX 6.5 % OT SOLN: 5.0000 [drp] | Freq: Two times a day (BID) | OTIC | 2 refills | Status: DC

## 2020-08-10 NOTE — Telephone Encounter (Signed)
Left message for patient to call back and schedule Medicare Annual Wellness Visit (AWV).   Please offer to do virtually or by telephone. Patient can reach me directly at [phone].  Last AWV:09/06/2018  Please schedule at anytime with Nurse Health Advisor.

## 2020-08-10 NOTE — Progress Notes (Signed)
Established Patient Office Visit  Subjective:  Patient ID: Samantha Briggs, female    DOB: 1932/10/06  Age: 87 y.o. MRN: [id number]  CC:  Chief Complaint  Patient presents with   Cerumen Impaction    Ear irrigation    HPI Samantha Briggs presents for evaluation of cerumen gnosis.  Audiologist had noted cerumen in both canals.  She needs new hearing aids.  She was referred over here for possible irrigation.  History of presbycusis.  History of cerumen gnosis.  Admits to using Q-tips.    Past Medical History:  Diagnosis Date   Allergy    Anemia    Depression    Glaucoma    Hyperlipidemia    Hypertension     Past Surgical History:  Procedure Laterality Date   CHOLECYSTECTOMY     CORNEAL TRANSPLANT     x2   DILATION AND CURETTAGE OF UTERUS     RETINAL DETACHMENT SURGERY      Family History  Problem Relation Age of Onset   Cancer Mother    Breast cancer Mother    Cancer Father    Pancreatic cancer Father    Diabetes Sister     Social History   Socioeconomic History   Marital status: Married    Spouse name: Not on file   Number of children: Not on file   Years of education: Not on file   Highest education level: Not on file  Occupational History   Not on file  Tobacco Use   Smoking status: Former    Types: Cigarettes    Quit date: 01/04/1972    Years since quitting: 48.6   Smokeless tobacco: Never  Vaping Use   Vaping Use: Never used  Substance and Sexual Activity   Alcohol use: No   Drug use: No   Sexual activity: Not on file  Other Topics Concern   Not on file  Social History Narrative   Not on file   Social Determinants of Health   Financial Resource Strain: Not on file  Food Insecurity: Not on file  Transportation Needs: Not on file  Physical Activity: Not on file  Stress: Not on file  Social Connections: Not on file  Intimate Partner Violence: Not on file    Outpatient Medications Prior to Visit  Medication Sig Dispense Refill    cetirizine (ZYRTEC) 10 MG tablet Take 10 mg by mouth daily.     dextromethorphan-guaiFENesin (MUCINEX DM) 30-600 MG 12hr tablet Take 1 tablet by mouth 2 (two) times daily. 20 tablet 0   dorzolamide (TRUSOPT) 2 % ophthalmic solution INSTILL ONE DROP INTO LEFT EYE TWICE DAILY  11   erythromycin ophthalmic ointment   1   fenofibrate 160 MG tablet TAKE ONE TABLET BY MOUTH DAILY 90 tablet 0   ferrous sulfate 325 (65 FE) MG tablet Take 1 tablet (325 mg total) by mouth daily with breakfast. 30 tablet 6   fluticasone (FLONASE) 50 MCG/ACT nasal spray PLACE TWO SPRAYS INTO BOTH NOSTRILS DAILY 16 mL 4   hydrochlorothiazide (HYDRODIURIL) 12.5 MG tablet TAKE ONE TABLET BY MOUTH DAILY 90 tablet 0   latanoprost (XALATAN) 0.005 % ophthalmic solution Place 1 drop into the left eye daily.      levothyroxine (SYNTHROID) 88 MCG tablet TAKE ONE TABLET BY MOUTH DAILY 90 tablet 0   olmesartan (BENICAR) 40 MG tablet TAKE ONE TABLET BY MOUTH DAILY 90 tablet 0   omeprazole (PRILOSEC) 20 MG capsule TAKE ONE CAPSULE BY MOUTH DAILY  90 capsule 0   PRED MILD 0.12 % ophthalmic suspension Place 1 drop into both eyes.      sertraline (ZOLOFT) 100 MG tablet Take 1 tablet (100 mg total) by mouth daily. 90 tablet 1   XIIDRA 5 % SOLN   3   No facility-administered medications prior to visit.    Allergies  Allergen Reactions   Morphine And Related Hives   Neurontin [Gabapentin]     hives    ROS Review of Systems  Constitutional: Negative.   HENT:  Positive for hearing loss. Negative for ear pain.   Psychiatric/Behavioral: Negative.       Objective:    Physical Exam Vitals and nursing note reviewed.  Constitutional:      Appearance: Normal appearance.  HENT:     Head: Normocephalic and atraumatic.     Right Ear: There is impacted cerumen.     Left Ear: There is impacted cerumen.  Eyes:     General: No scleral icterus.       Right eye: No discharge.        Left eye: No discharge.     Conjunctiva/sclera:  Conjunctivae normal.  Pulmonary:     Effort: Pulmonary effort is normal.  Neurological:     Mental Status: She is alert and oriented to person, place, and time.  Psychiatric:        Mood and Affect: Mood normal.        Behavior: Behavior normal.    BP (!) 158/78 (BP Location: Right Arm, Patient Position: Sitting, Cuff Size: Large)   Pulse 87   Temp (!) 97.3 F (36.3 C) (Temporal)   Ht 5\' 7"  (1.702 m)   Wt 188 lb 9.6 oz (85.5 kg)   SpO2 97%   BMI 29.54 kg/m  Wt Readings from Last 3 Encounters:  08/10/20 188 lb 9.6 oz (85.5 kg)  04/03/20 171 lb (77.6 kg)  03/17/20 180 lb 12.8 oz (82 kg)     Health Maintenance Due  Topic Date Due   INFLUENZA VACCINE  08/03/2020    There are no preventive care reminders to display for this patient.  Lab Results  Component Value Date   TSH 3.11 03/17/2020   Lab Results  Component Value Date   WBC 4.3 03/17/2020   HGB 11.8 (L) 03/17/2020   HCT 35.9 (L) 03/17/2020   MCV 83.0 03/17/2020   PLT 165.0 03/17/2020   Lab Results  Component Value Date   NA 138 03/17/2020   K 4.0 03/17/2020   CO2 28 03/17/2020   GLUCOSE 86 03/17/2020   BUN 28 (H) 03/17/2020   CREATININE 0.97 03/17/2020   BILITOT 0.3 03/17/2020   ALKPHOS 45 03/17/2020   AST 16 03/17/2020   ALT 9 03/17/2020   PROT 6.8 03/17/2020   ALBUMIN 3.9 03/17/2020   CALCIUM 9.5 03/17/2020   GFR 53.39 (L) 03/17/2020   Lab Results  Component Value Date   CHOL 168 03/17/2020   Lab Results  Component Value Date   HDL 36.40 (L) 03/17/2020   Lab Results  Component Value Date   LDLCALC 92 03/17/2020   Lab Results  Component Value Date   TRIG 197.0 (H) 03/17/2020   Lab Results  Component Value Date   CHOLHDL 5 03/17/2020   No results found for: HGBA1C    Assessment & Plan:   Problem List Items Addressed This Visit   None Visit Diagnoses     Excessive cerumen in both ear canals    -  Primary   Relevant Medications   carbamide peroxide (DEBROX) 6.5 % OTIC  solution       Meds ordered this encounter  Medications   carbamide peroxide (DEBROX) 6.5 % OTIC solution    Sig: Place 5 drops into both ears 2 (two) times daily.    Dispense:  15 mL    Refill:  2    Follow-up: Return in about 2 weeks (around 08/24/2020).    Mliss Sax, MD

## 2020-08-24 ENCOUNTER — Encounter: Payer: Self-pay | Admitting: Family Medicine

## 2020-08-24 ENCOUNTER — Ambulatory Visit (INDEPENDENT_AMBULATORY_CARE_PROVIDER_SITE_OTHER): Payer: Medicare Other | Admitting: Family Medicine

## 2020-08-24 ENCOUNTER — Other Ambulatory Visit: Payer: Self-pay

## 2020-08-24 VITALS — BP 158/70 | HR 110 | Temp 97.0°F | Ht 67.0 in | Wt 183.6 lb

## 2020-08-24 DIAGNOSIS — I10 Essential (primary) hypertension: Secondary | ICD-10-CM | POA: Diagnosis not present

## 2020-08-24 DIAGNOSIS — H6123 Impacted cerumen, bilateral: Secondary | ICD-10-CM | POA: Diagnosis not present

## 2020-08-24 NOTE — Progress Notes (Signed)
Established Patient Office Visit  Subjective:  Patient ID: Samantha Briggs, female    DOB: [birth year]  Age: 87 y.o. MRN: [id number]  CC:  Chief Complaint  Patient presents with   Cerumen Impaction    Cerumen removed in both ears.     HPI Samantha Briggs presents for follow-up on ceruminosis.  Has been using Debrox eardrops faithfully over the last few weeks.  Cannot tell if that helped or not.  Claims compliance with her blood pressure medicines.  Past Medical History:  Diagnosis Date   Allergy    Anemia    Depression    Glaucoma    Hyperlipidemia    Hypertension     Past Surgical History:  Procedure Laterality Date   CHOLECYSTECTOMY     CORNEAL TRANSPLANT     x2   DILATION AND CURETTAGE OF UTERUS     RETINAL DETACHMENT SURGERY      Family History  Problem Relation Age of Onset   Cancer Mother    Breast cancer Mother    Cancer Father    Pancreatic cancer Father    Diabetes Sister     Social History   Socioeconomic History   Marital status: Married    Spouse name: Not on file   Number of children: Not on file   Years of education: Not on file   Highest education level: Not on file  Occupational History   Not on file  Tobacco Use   Smoking status: Former    Types: Cigarettes    Quit date: 01/04/1972    Years since quitting: 48.6   Smokeless tobacco: Never  Vaping Use   Vaping Use: Never used  Substance and Sexual Activity   Alcohol use: No   Drug use: No   Sexual activity: Not on file  Other Topics Concern   Not on file  Social History Narrative   Not on file   Social Determinants of Health   Financial Resource Strain: Not on file  Food Insecurity: Not on file  Transportation Needs: Not on file  Physical Activity: Not on file  Stress: Not on file  Social Connections: Not on file  Intimate Partner Violence: Not on file    Outpatient Medications Prior to Visit  Medication Sig Dispense Refill   carbamide peroxide (DEBROX) 6.5 %  OTIC solution Place 5 drops into both ears 2 (two) times daily. 15 mL 2   cetirizine (ZYRTEC) 10 MG tablet Take 10 mg by mouth daily.     dorzolamide (TRUSOPT) 2 % ophthalmic solution INSTILL ONE DROP INTO LEFT EYE TWICE DAILY  11   erythromycin ophthalmic ointment   1   fenofibrate 160 MG tablet TAKE ONE TABLET BY MOUTH DAILY 90 tablet 0   ferrous sulfate 325 (65 FE) MG tablet Take 1 tablet (325 mg total) by mouth daily with breakfast. 30 tablet 6   fluticasone (FLONASE) 50 MCG/ACT nasal spray PLACE TWO SPRAYS INTO BOTH NOSTRILS DAILY 16 mL 4   hydrochlorothiazide (HYDRODIURIL) 12.5 MG tablet TAKE ONE TABLET BY MOUTH DAILY 90 tablet 0   latanoprost (XALATAN) 0.005 % ophthalmic solution Place 1 drop into the left eye daily.      levothyroxine (SYNTHROID) 88 MCG tablet TAKE ONE TABLET BY MOUTH DAILY 90 tablet 0   olmesartan (BENICAR) 40 MG tablet TAKE ONE TABLET BY MOUTH DAILY 90 tablet 0   omeprazole (PRILOSEC) 20 MG capsule TAKE ONE CAPSULE BY MOUTH DAILY 90 capsule 0   PRED MILD  0.12 % ophthalmic suspension Place 1 drop into both eyes.      sertraline (ZOLOFT) 100 MG tablet Take 1 tablet (100 mg total) by mouth daily. 90 tablet 1   XIIDRA 5 % SOLN   3   dextromethorphan-guaiFENesin (MUCINEX DM) 30-600 MG 12hr tablet Take 1 tablet by mouth 2 (two) times daily. 20 tablet 0   No facility-administered medications prior to visit.    Allergies  Allergen Reactions   Morphine And Related Hives   Neurontin [Gabapentin]     hives    ROS Review of Systems  Constitutional: Negative.   HENT:  Positive for hearing loss. Negative for ear discharge and ear pain.   Respiratory: Negative.    Cardiovascular: Negative.   Gastrointestinal: Negative.      Objective:    Physical Exam Vitals and nursing note reviewed.  Constitutional:      General: She is not in acute distress.    Appearance: Normal appearance. She is not ill-appearing, toxic-appearing or diaphoretic.  HENT:     Head:  Normocephalic and atraumatic.     Ears:      Mouth/Throat:     Mouth: Mucous membranes are moist.     Pharynx: Oropharynx is clear. No oropharyngeal exudate or posterior oropharyngeal erythema.  Eyes:     General: No scleral icterus.       Right eye: No discharge.        Left eye: No discharge.     Conjunctiva/sclera: Conjunctivae normal.  Pulmonary:     Effort: Pulmonary effort is normal.  Skin:    General: Skin is warm and dry.  Neurological:     Mental Status: She is alert and oriented to person, place, and time.  Psychiatric:        Mood and Affect: Mood normal.        Behavior: Behavior normal.     MATTHEW PAIS is a 87 y.o. female whom I am asked to see for evaluation of diminished hearing in both ears for the past 9 month. There is a prior history of cerumen impaction. The patient has been using ear drops to loosen wax immediately prior to this visit. The patient denies ear pain.  The patient's history has been marked as reviewed and updated as appropriate.  Review of Systems Pertinent items are noted in HPI.    Objective:    Auditory canal(s) of both ears are partially obstructed with cerumen.   Cerumen was removed using gentle irrigation. Tympanic membranes are intact following the procedure.  Auditory canals are normal.    Assessment:    Cerumen Impaction without otitis externa.    Plan:    1. Care instructions given. 2. Home treatment: none. 3. Follow-up as needed.    BP (!) 158/70 (BP Location: Right Arm, Patient Position: Sitting, Cuff Size: Normal)   Pulse (!) 110   Temp (!) 97 F (36.1 C) (Temporal)   Ht 5\' 7"  (1.702 m)   Wt 183 lb 9.6 oz (83.3 kg)   SpO2 98%   BMI 28.76 kg/m  Wt Readings from Last 3 Encounters:  08/24/20 183 lb 9.6 oz (83.3 kg)  08/10/20 188 lb 9.6 oz (85.5 kg)  04/03/20 171 lb (77.6 kg)     Health Maintenance Due  Topic Date Due   INFLUENZA VACCINE  08/03/2020    There are no preventive care reminders to  display for this patient.  Lab Results  Component Value Date   TSH 3.11 03/17/2020  Lab Results  Component Value Date   WBC 4.3 03/17/2020   HGB 11.8 (L) 03/17/2020   HCT 35.9 (L) 03/17/2020   MCV 83.0 03/17/2020   PLT 165.0 03/17/2020   Lab Results  Component Value Date   NA 138 03/17/2020   K 4.0 03/17/2020   CO2 28 03/17/2020   GLUCOSE 86 03/17/2020   BUN 28 (H) 03/17/2020   CREATININE 0.97 03/17/2020   BILITOT 0.3 03/17/2020   ALKPHOS 45 03/17/2020   AST 16 03/17/2020   ALT 9 03/17/2020   PROT 6.8 03/17/2020   ALBUMIN 3.9 03/17/2020   CALCIUM 9.5 03/17/2020   GFR 53.39 (L) 03/17/2020   Lab Results  Component Value Date   CHOL 168 03/17/2020   Lab Results  Component Value Date   HDL 36.40 (L) 03/17/2020   Lab Results  Component Value Date   LDLCALC 92 03/17/2020   Lab Results  Component Value Date   TRIG 197.0 (H) 03/17/2020   Lab Results  Component Value Date   CHOLHDL 5 03/17/2020   No results found for: HGBA1C    Assessment & Plan:   Problem List Items Addressed This Visit       Cardiovascular and Mediastinum   Essential hypertension - Primary     Nervous and Auditory   Excessive cerumen in both ear canals    No orders of the defined types were placed in this encounter.   Follow-up: Return BP elevated for 2 clinic visits. Will see Dr. Beverely Low for physical in next few weeks.Mliss Sax, MD

## 2020-09-11 ENCOUNTER — Other Ambulatory Visit: Payer: Self-pay | Admitting: Family Medicine

## 2020-09-17 ENCOUNTER — Encounter: Payer: Self-pay | Admitting: Family Medicine

## 2020-09-17 ENCOUNTER — Ambulatory Visit (INDEPENDENT_AMBULATORY_CARE_PROVIDER_SITE_OTHER): Payer: Medicare Other | Admitting: Family Medicine

## 2020-09-17 ENCOUNTER — Other Ambulatory Visit: Payer: Self-pay

## 2020-09-17 VITALS — BP 132/78 | HR 69 | Temp 97.6°F | Resp 17 | Ht 66.0 in | Wt 182.4 lb

## 2020-09-17 DIAGNOSIS — I10 Essential (primary) hypertension: Secondary | ICD-10-CM | POA: Diagnosis not present

## 2020-09-17 DIAGNOSIS — Z23 Encounter for immunization: Secondary | ICD-10-CM

## 2020-09-17 DIAGNOSIS — Z0000 Encounter for general adult medical examination without abnormal findings: Secondary | ICD-10-CM

## 2020-09-17 LAB — HEPATIC FUNCTION PANEL
ALT: 8 U/L (ref 0–35)
AST: 13 U/L (ref 0–37)
Albumin: 4.2 g/dL (ref 3.5–5.2)
Alkaline Phosphatase: 48 U/L (ref 39–117)
Bilirubin, Direct: 0.1 mg/dL (ref 0.0–0.3)
Total Bilirubin: 0.4 mg/dL (ref 0.2–1.2)
Total Protein: 6.9 g/dL (ref 6.0–8.3)

## 2020-09-17 LAB — CBC WITH DIFFERENTIAL/PLATELET
Basophils Absolute: 0 10*3/uL (ref 0.0–0.1)
Basophils Relative: 0.9 % (ref 0.0–3.0)
Eosinophils Absolute: 0.1 10*3/uL (ref 0.0–0.7)
Eosinophils Relative: 2.3 % (ref 0.0–5.0)
HCT: 36.3 % (ref 36.0–46.0)
Hemoglobin: 11.6 g/dL — ABNORMAL LOW (ref 12.0–15.0)
Lymphocytes Relative: 23.5 % (ref 12.0–46.0)
Lymphs Abs: 0.9 10*3/uL (ref 0.7–4.0)
MCHC: 32 g/dL (ref 30.0–36.0)
MCV: 82.3 fl (ref 78.0–100.0)
Monocytes Absolute: 0.3 10*3/uL (ref 0.1–1.0)
Monocytes Relative: 7.1 % (ref 3.0–12.0)
Neutro Abs: 2.6 10*3/uL (ref 1.4–7.7)
Neutrophils Relative %: 66.2 % (ref 43.0–77.0)
Platelets: 145 10*3/uL — ABNORMAL LOW (ref 150.0–400.0)
RBC: 4.41 Mil/uL (ref 3.87–5.11)
RDW: 16.3 % — ABNORMAL HIGH (ref 11.5–15.5)
WBC: 3.9 10*3/uL — ABNORMAL LOW (ref 4.0–10.5)

## 2020-09-17 LAB — LIPID PANEL
Cholesterol: 191 mg/dL (ref 0–200)
HDL: 38.7 mg/dL — ABNORMAL LOW (ref >39.00)
LDL Cholesterol: 115 mg/dL — ABNORMAL HIGH (ref 0–99)
NonHDL: 152.42
Total CHOL/HDL Ratio: 5
Triglycerides: 187 mg/dL — ABNORMAL HIGH (ref 0.0–149.0)
VLDL: 37.4 mg/dL (ref 0.0–40.0)

## 2020-09-17 LAB — BASIC METABOLIC PANEL
BUN: 26 mg/dL — ABNORMAL HIGH (ref 6–23)
CO2: 30 mEq/L (ref 19–32)
Calcium: 9.7 mg/dL (ref 8.4–10.5)
Chloride: 104 mEq/L (ref 96–112)
Creatinine, Ser: 1.1 mg/dL (ref 0.40–1.20)
GFR: 45.75 mL/min — ABNORMAL LOW (ref >60.00)
Glucose, Bld: 92 mg/dL (ref 70–99)
Potassium: 4 mEq/L (ref 3.5–5.1)
Sodium: 142 mEq/L (ref 135–145)

## 2020-09-17 LAB — TSH: TSH: 4.52 u[IU]/mL (ref 0.35–5.50)

## 2020-09-17 NOTE — Patient Instructions (Addendum)
Follow up in 6 months to recheck BP and cholesterol We'll notify you of your lab results and make any changes if needed Keep up the good work on healthy diet and regular physical activity- you look great! Call with any questions or concerns Happy Fall!!

## 2020-09-17 NOTE — Progress Notes (Signed)
   Subjective:    Patient ID: Samantha Briggs, female    DOB: 08/09/1932, 87 y.o.   MRN: [id number]  HPI CPE- UTD on pneumonia vaccines, Tdap.  Due for flu today.  No longer doing colon cancer screening or mammograms.  Pt reports feeling well- eating well, sleeping well, active  Patient Care Team    Relationship Specialty Notifications Start End  Sheliah Hatch, MD PCP - General Family Medicine  10/07/10    Comment: Royetta Car, MD Consulting Physician Ophthalmology  09/06/18   [name], [facility] Pharmacist Pharmacist  04/02/19    Comment: phone number [phone]    Health Maintenance  Topic Date Due  . INFLUENZA VACCINE  08/03/2020  . TETANUS/TDAP  10/29/2023  . DEXA SCAN  Completed  . PNA vac Low Risk Adult  Completed  . Zoster Vaccines- Shingrix  Completed  . HPV VACCINES  Aged Out  . COVID-19 Vaccine  Discontinued      Review of Systems Patient reports no vision/ hearing changes, adenopathy,fever, weight change,  persistant/recurrent hoarseness , swallowing issues, chest pain, palpitations, edema, persistant/recurrent cough, hemoptysis, dyspnea (rest/exertional/paroxysmal nocturnal), gastrointestinal bleeding (melena, rectal bleeding), abdominal pain, significant heartburn, bowel changes, GU symptoms (dysuria, hematuria, incontinence), Gyn symptoms (abnormal  bleeding, pain),  syncope, focal weakness, memory loss, numbness & tingling, skin/hair/nail changes, abnormal bruising or bleeding, anxiety, or depression.   This visit occurred during the SARS-CoV-2 public health emergency.  Safety protocols were in place, including screening questions prior to the visit, additional usage of staff PPE, and extensive cleaning of exam room while observing appropriate contact time as indicated for disinfecting solutions.      Objective:   Physical Exam General Appearance:    Alert, cooperative, no distress, appears stated age  Head:    Normocephalic, without obvious  abnormality, atraumatic  Eyes:    PERRL, conjunctiva/corneas clear, EOM's intact, fundi    benign, both eyes  Ears:    Normal TM's and external ear canals, both ears  Nose:   Deferred due to COVID  Throat:   Neck:   Supple, symmetrical, trachea midline, no adenopathy;    Thyroid: no enlargement/tenderness/nodules  Back:     Symmetric, no curvature, ROM normal, no CVA tenderness  Lungs:     Clear to auscultation bilaterally, respirations unlabored  Chest Wall:    No tenderness or deformity   Heart:    Regular rate and rhythm, S1 and S2 normal, no murmur, rub   or gallop  Breast Exam:    Deferred  Abdomen:     Soft, non-tender, bowel sounds active all four quadrants,    no masses, no organomegaly  Genitalia:    Deferred  Rectal:    Extremities:   Extremities normal, atraumatic, no cyanosis or edema  Pulses:   2+ and symmetric all extremities  Skin:   Skin color, texture, turgor normal, no rashes or lesions  Lymph nodes:   Cervical, supraclavicular, and axillary nodes normal  Neurologic:   CNII-XII intact, normal strength, sensation and reflexes    throughout          Assessment & Plan:

## 2020-09-17 NOTE — Assessment & Plan Note (Signed)
Pt's PE unchanged from previous.  No longer doing colon cancer screening or mammograms.  Flu shot given today.  UTD on pneumonia vaccines.  Check labs.  Anticipatory guidance provided.

## 2020-09-17 NOTE — Assessment & Plan Note (Signed)
Chronic problem.  Currently well controlled and asymptomatic.  Check labs but no anticipated med changes.

## 2020-09-21 ENCOUNTER — Telehealth: Payer: Self-pay

## 2020-09-21 NOTE — Progress Notes (Addendum)
Chronic Care Management Pharmacy Assistant   Name: Samantha Briggs  MRN: [id number] DOB: 01/17/1932   Reason for Encounter: Disease State - General Adherence Call / Scheduled CPP Follow up     Recent office visits:  09/17/20 Neena Rhymes, MD  (PCP) - Routine General Medical Exam - Labs were ordered -  No medications changes. Follow up in 6 months.   08/24/20 Cena Benton, MD - Family Medicine - Hypertension - No medication changes. Follow up as needed.   08/10/20 Nadene Rubins, MD - Family Medicine - Excessive Cerumen in ear - Carbamide peroxide (DEBROX) 6.5 % OTIC solution prescribed. Follow up in 2 weeks.  07/14/20 Janeece Agee, NP - Family Medicine -(Video visit) COVID 19 - Dextromethorphan-guaiFENesin (MUCINEX DM) 30-600 MG 12hr tablet Take 1 tablet by mouth 2 (two) times daily and Molnupiravir EUA 200 mg CAPS Take 4 capsules (800 mg total) by mouth 2 (two) times daily for 5 days prescribed. Follow up not indicated.  04/03/20 Herbie Drape, MD - Family Medicine - (Video Visit) - Acute Idiopathic Gout of left foot - Prednisone (DELTASONE) 10 MG tablet taper instructions prescribed. Follow up not indicated.   03/17/20 Neena Rhymes, MD (PCP) -  Family Medicine - Labs were ordered - No medication changes - Follow up in 6 months for Physical   Recent consult visits:  None noted.   Hospital visits:  None in previous 6 months  Medications: Outpatient Encounter Medications as of 09/21/2020  Medication Sig Note   carbamide peroxide (DEBROX) 6.5 % OTIC solution Place 5 drops into both ears 2 (two) times daily.    cetirizine (ZYRTEC) 10 MG tablet Take 10 mg by mouth daily.    dorzolamide (TRUSOPT) 2 % ophthalmic solution INSTILL ONE DROP INTO LEFT EYE TWICE DAILY 09/05/2014: Received from: External Pharmacy   erythromycin ophthalmic ointment     fenofibrate 160 MG tablet TAKE ONE TABLET BY MOUTH DAILY    ferrous sulfate 325 (65 FE) MG tablet Take 1 tablet (325 mg total) by  mouth daily with breakfast.    fluticasone (FLONASE) 50 MCG/ACT nasal spray PLACE TWO SPRAYS INTO BOTH NOSTRILS DAILY    hydrochlorothiazide (HYDRODIURIL) 12.5 MG tablet TAKE ONE TABLET BY MOUTH DAILY    latanoprost (XALATAN) 0.005 % ophthalmic solution Place 1 drop into the left eye daily.     levothyroxine (SYNTHROID) 88 MCG tablet TAKE ONE TABLET BY MOUTH DAILY    olmesartan (BENICAR) 40 MG tablet TAKE ONE TABLET BY MOUTH DAILY    omeprazole (PRILOSEC) 20 MG capsule TAKE ONE CAPSULE BY MOUTH DAILY    PRED MILD 0.12 % ophthalmic suspension Place 1 drop into both eyes.     sertraline (ZOLOFT) 100 MG tablet Take 1 tablet (100 mg total) by mouth daily. (Patient taking differently: Take 100 mg by mouth daily. 1/2 tab)    XIIDRA 5 % SOLN     No facility-administered encounter medications on file as of 09/21/2020.    Have you had any problems recently with your health? Patient denied any problems with her recent health.   Have you had any problems with your pharmacy? Patient denied any problem with her current pharmacy.  What issues or side effects are you having with your medications? Patient denied any side effects or issues with her current medications.   What would you like me to pass along to [name], CPP for them to help you with?  Patient did not have anything to pass along to CPP at  this time.   What can we do to take care of you better? Patient did not have any recommendations and stated she just had her physcial and is doing very well.   Care Gaps  AWV: done 09/14/20 Colonoscopy: done 01/03/09 DM Eye Exam:  N/A DM Foot Exam: N/A Microalbumin: N/A HbgAIC: N/A DEXA: last done 01/03/09 Mammogram: unknown    Star Rating Drugs: Olmesartan (BENICAR) 40 MG tablet - last filled 04/09/20 90 days    Future Appointments  Date Time Provider Department Center  03/17/2021  2:00 PM Sheliah Hatch, MD [facility]    *Patient wished to hold off on scheduling follow up with CPP  at this time as she stated she just had her physical with Dr Beverely Low last week and had no issues at the moment.   Eugenie Filler, [facility] Clinical Pharmacist Assistant  [phone]

## 2020-10-02 ENCOUNTER — Other Ambulatory Visit: Payer: Self-pay | Admitting: Family Medicine

## 2020-10-08 DIAGNOSIS — Z961 Presence of intraocular lens: Secondary | ICD-10-CM | POA: Diagnosis not present

## 2020-10-08 DIAGNOSIS — H401123 Primary open-angle glaucoma, left eye, severe stage: Secondary | ICD-10-CM | POA: Diagnosis not present

## 2020-10-08 DIAGNOSIS — H52203 Unspecified astigmatism, bilateral: Secondary | ICD-10-CM | POA: Diagnosis not present

## 2020-10-15 ENCOUNTER — Other Ambulatory Visit: Payer: Self-pay | Admitting: Family Medicine

## 2020-11-05 ENCOUNTER — Other Ambulatory Visit: Payer: Self-pay | Admitting: Family Medicine

## 2020-11-05 DIAGNOSIS — F418 Other specified anxiety disorders: Secondary | ICD-10-CM

## 2020-11-11 ENCOUNTER — Other Ambulatory Visit: Payer: Self-pay

## 2020-11-11 ENCOUNTER — Telehealth (INDEPENDENT_AMBULATORY_CARE_PROVIDER_SITE_OTHER): Payer: Medicare Other | Admitting: Registered Nurse

## 2020-11-11 ENCOUNTER — Encounter: Payer: Self-pay | Admitting: Registered Nurse

## 2020-11-11 DIAGNOSIS — M10072 Idiopathic gout, left ankle and foot: Secondary | ICD-10-CM

## 2020-11-11 MED ORDER — PREDNISONE 10 MG PO TABS: ORAL_TABLET | ORAL | 0 refills | Status: AC

## 2020-11-11 MED ORDER — COLCHICINE 0.6 MG PO CAPS
0.6000 mg | ORAL_CAPSULE | Freq: Every day | ORAL | 5 refills | Status: DC
Start: 2020-11-11 — End: 2021-03-22

## 2020-11-11 NOTE — Progress Notes (Signed)
Telemedicine Encounter- SOAP NOTE Established Patient  This telephone encounter was conducted with the patient's (or proxy's) verbal consent via audio telecommunications: yes/no: Yes Patient was instructed to have this encounter in a suitably private space; and to only have persons present to whom they give permission to participate. In addition, patient identity was confirmed by use of name plus two identifiers (DOB and address).  I discussed the limitations, risks, security and privacy concerns of performing an evaluation and management service by telephone and the availability of in person appointments. I also discussed with the patient that there may be a patient responsible charge related to this service. The patient expressed understanding and agreed to proceed.  I spent a total of 17 minutes talking with the patient or their proxy.  Patient at home Provider in office  Participants: Jari Sportsman, NP and [name]  Chief Complaint  Patient presents with   Gout    Patient states she has been having problems with her gout in her left big toe,. Patient states the she pain has got worse this morning.    Subjective   Samantha Briggs is a 87 y.o. established patient. Telephone visit today for gout  HPI Flare up recently. Has had multiple in past. Most recent flare was in April of 2022. L first mtp Red, warm, swollen.  Typical symptoms Has been resting today, it is not as painful.  Has used prednisone in past with good effect  Cannot tolerate opioids GFR 45 as of mid sept 2022.   Patient Active Problem List   Diagnosis Date Noted   Excessive cerumen in both ear canals 08/24/2020   Left foot pain 11/29/2019   Closed nondisplaced fracture of fifth left metatarsal bone 11/29/2019   Overweight (BMI 25.0-29.9) 06/28/2018   Hypothyroidism 04/21/2015   Sciatica of right side 12/02/2014   Anemia, iron deficiency 11/19/2013   IBS (irritable bowel syndrome)  06/17/2013   GERD (gastroesophageal reflux disease) 06/17/2013   Hypertriglyceridemia 03/13/2013   Routine general medical examination at a health care facility 09/19/2012   Plantar fasciitis 08/17/2012   Gout 08/17/2012   Post herpetic neuralgia 06/02/2011   Insomnia 05/18/2011   Essential hypertension 10/17/2010   Depression with anxiety 10/17/2010   Glaucoma 10/17/2010    Past Medical History:  Diagnosis Date   Allergy    Anemia    Depression    Glaucoma    Hyperlipidemia    Hypertension     Current Outpatient Medications  Medication Sig Dispense Refill   carbamide peroxide (DEBROX) 6.5 % OTIC solution Place 5 drops into both ears 2 (two) times daily. 15 mL 2   cetirizine (ZYRTEC) 10 MG tablet Take 10 mg by mouth daily.     Colchicine 0.6 MG CAPS Take 0.6 mg by mouth daily for 12 doses. 12 capsule 5   erythromycin ophthalmic ointment   1   fenofibrate 160 MG tablet TAKE ONE TABLET BY MOUTH DAILY 90 tablet 0   ferrous sulfate 325 (65 FE) MG tablet Take 1 tablet (325 mg total) by mouth daily with breakfast. 30 tablet 6   fluticasone (FLONASE) 50 MCG/ACT nasal spray PLACE TWO SPRAYS INTO BOTH NOSTRILS DAILY 16 mL 4   hydrochlorothiazide (HYDRODIURIL) 12.5 MG tablet TAKE ONE TABLET BY MOUTH DAILY 90 tablet 0   latanoprost (XALATAN) 0.005 % ophthalmic solution Place 1 drop into the left eye daily.      levothyroxine (SYNTHROID) 88 MCG tablet TAKE ONE TABLET BY MOUTH DAILY  90 tablet 0   olmesartan (BENICAR) 40 MG tablet TAKE ONE TABLET BY MOUTH DAILY 90 tablet 0   omeprazole (PRILOSEC) 20 MG capsule TAKE ONE CAPSULE BY MOUTH DAILY 90 capsule 0   PRED MILD 0.12 % ophthalmic suspension Place 1 drop into both eyes.      predniSONE (DELTASONE) 10 MG tablet Take 3 tablets (30 mg total) by mouth daily with breakfast for 3 days, THEN 2 tablets (20 mg total) daily with breakfast for 3 days, THEN 1 tablet (10 mg total) daily with breakfast for 3 days. 18 tablet 0   sertraline (ZOLOFT) 100  MG tablet TAKE 1 TABLET (100 MG TOTAL) BY MOUTH DAILY. 90 tablet 0   XIIDRA 5 % SOLN   3   dorzolamide (TRUSOPT) 2 % ophthalmic solution INSTILL ONE DROP INTO LEFT EYE TWICE DAILY (Patient not taking: Reported on 11/11/2020)  11   No current facility-administered medications for this visit.    Allergies  Allergen Reactions   Morphine And Related Hives   Neurontin [Gabapentin]     hives    Social History   Socioeconomic History   Marital status: Married    Spouse name: Not on file   Number of children: Not on file   Years of education: Not on file   Highest education level: Not on file  Occupational History   Not on file  Tobacco Use   Smoking status: Former    Types: Cigarettes    Quit date: 01/04/1972    Years since quitting: 48.8   Smokeless tobacco: Never  Vaping Use   Vaping Use: Never used  Substance and Sexual Activity   Alcohol use: No   Drug use: No   Sexual activity: Not on file  Other Topics Concern   Not on file  Social History Narrative   Not on file   Social Determinants of Health   Financial Resource Strain: Not on file  Food Insecurity: Not on file  Transportation Needs: Not on file  Physical Activity: Not on file  Stress: Not on file  Social Connections: Not on file  Intimate Partner Violence: Not on file    Review of Systems  Constitutional: Negative.   HENT: Negative.    Eyes: Negative.   Respiratory: Negative.    Cardiovascular: Negative.   Gastrointestinal: Negative.   Genitourinary: Negative.   Musculoskeletal:  Positive for joint pain. Negative for back pain, falls, myalgias and neck pain.  Skin: Negative.   Neurological: Negative.   Endo/Heme/Allergies: Negative.   Psychiatric/Behavioral: Negative.    All other systems reviewed and are negative.  Objective   Vitals as reported by the patient: There were no vitals filed for this visit.  [name] was seen today for gout.  Diagnoses and all orders for this visit:  Acute  idiopathic gout of left foot -     predniSONE (DELTASONE) 10 MG tablet; Take 3 tablets (30 mg total) by mouth daily with breakfast for 3 days, THEN 2 tablets (20 mg total) daily with breakfast for 3 days, THEN 1 tablet (10 mg total) daily with breakfast for 3 days. -     Colchicine 0.6 MG CAPS; Take 0.6 mg by mouth daily for 12 doses.   PLAN Acute flare of gout of first MTP of left foot Prednisone taper as above Can try colchicine in limited doses. Return precautions reviewed Patient encouraged to call clinic with any questions, comments, or concerns.   I discussed the assessment and treatment plan with the  patient. The patient was provided an opportunity to ask questions and all were answered. The patient agreed with the plan and demonstrated an understanding of the instructions.   The patient was advised to call back or seek an in-person evaluation if the symptoms worsen or if the condition fails to improve as anticipated.  I provided 16 minutes of non-face-to-face time during this encounter.  Janeece Agee, NP

## 2020-11-11 NOTE — Patient Instructions (Addendum)
Ms. Shereena Berquist to speak with you  Ok to use colchicine for breakthrough pain and if a new flare starts  Take prednisone as directed for this current flare.  Call me if things get worse or fail to get better  Ice, heat, rest, elevation are all recommended.  Thank you  Rich     If you have lab work done today you will be contacted with your lab results within the next 2 weeks.  If you have not heard from [place] then please contact us. The fastest way to get your results is to register for My Chart.   IF you received an x-ray today, you will receive an invoice from [facility]. Please contact [facility] at [phone] with questions or concerns regarding your invoice.   IF you received labwork today, you will receive an invoice from [facility]. Please contact [facility] at [phone] with questions or concerns regarding your invoice.   Our billing staff will not be able to assist you with questions regarding bills from these companies.  You will be contacted with the lab results as soon as they are available. The fastest way to get your results is to activate your My Chart account. Instructions are located on the last page of this paperwork. If you have not heard from [place] regarding the results in 2 weeks, please contact this office.

## 2020-12-29 ENCOUNTER — Other Ambulatory Visit: Payer: Self-pay | Admitting: Family Medicine

## 2020-12-31 ENCOUNTER — Telehealth: Payer: Self-pay | Admitting: Family Medicine

## 2020-12-31 NOTE — Telephone Encounter (Signed)
Left message for patient to call back and schedule Medicare Annual Wellness Visit (AWV) in office.   If not able to come in office, please offer to do virtually or by telephone.  Left office number and my jabber [phone].  Last AWV:09/06/2018  Please schedule at anytime with Nurse Health Advisor.

## 2021-01-20 ENCOUNTER — Encounter: Payer: Self-pay | Admitting: Registered Nurse

## 2021-01-20 ENCOUNTER — Ambulatory Visit (INDEPENDENT_AMBULATORY_CARE_PROVIDER_SITE_OTHER): Payer: Medicare Other | Admitting: Registered Nurse

## 2021-01-20 ENCOUNTER — Telehealth: Payer: Self-pay | Admitting: Family Medicine

## 2021-01-20 VITALS — BP 132/84 | HR 100 | Temp 98.1°F | Ht 66.0 in | Wt 186.2 lb

## 2021-01-20 DIAGNOSIS — R5383 Other fatigue: Secondary | ICD-10-CM | POA: Diagnosis not present

## 2021-01-20 DIAGNOSIS — R519 Headache, unspecified: Secondary | ICD-10-CM

## 2021-01-20 DIAGNOSIS — R10A1 Flank pain, right side: Secondary | ICD-10-CM

## 2021-01-20 DIAGNOSIS — E2839 Other primary ovarian failure: Secondary | ICD-10-CM | POA: Diagnosis not present

## 2021-01-20 DIAGNOSIS — R109 Unspecified abdominal pain: Secondary | ICD-10-CM

## 2021-01-20 LAB — CBC WITH DIFFERENTIAL/PLATELET
Basophils Absolute: 0 10*3/uL (ref 0.0–0.1)
Basophils Relative: 0.3 % (ref 0.0–3.0)
Eosinophils Absolute: 0 10*3/uL (ref 0.0–0.7)
Eosinophils Relative: 1 % (ref 0.0–5.0)
HCT: 36.7 % (ref 36.0–46.0)
Hemoglobin: 11.7 g/dL — ABNORMAL LOW (ref 12.0–15.0)
Lymphocytes Relative: 20.5 % (ref 12.0–46.0)
Lymphs Abs: 1 10*3/uL (ref 0.7–4.0)
MCHC: 31.8 g/dL (ref 30.0–36.0)
MCV: 82.6 fl (ref 78.0–100.0)
Monocytes Absolute: 0.3 10*3/uL (ref 0.1–1.0)
Monocytes Relative: 6.9 % (ref 3.0–12.0)
Neutro Abs: 3.4 10*3/uL (ref 1.4–7.7)
Neutrophils Relative %: 71.3 % (ref 43.0–77.0)
Platelets: 177 10*3/uL (ref 150.0–400.0)
RBC: 4.45 Mil/uL (ref 3.87–5.11)
RDW: 15.2 % (ref 11.5–15.5)
WBC: 4.7 10*3/uL (ref 4.0–10.5)

## 2021-01-20 LAB — LIPID PANEL
Cholesterol: 194 mg/dL (ref 0–200)
HDL: 38.1 mg/dL — ABNORMAL LOW (ref >39.00)
LDL Cholesterol: 117 mg/dL — ABNORMAL HIGH (ref 0–99)
NonHDL: 156.07
Total CHOL/HDL Ratio: 5
Triglycerides: 197 mg/dL — ABNORMAL HIGH (ref 0.0–149.0)
VLDL: 39.4 mg/dL (ref 0.0–40.0)

## 2021-01-20 LAB — POCT URINALYSIS DIPSTICK
Bilirubin, UA: NEGATIVE
Blood, UA: NEGATIVE
Glucose, UA: NEGATIVE
Ketones, UA: NEGATIVE
Leukocytes, UA: NEGATIVE
Nitrite, UA: NEGATIVE
Protein, UA: NEGATIVE
Spec Grav, UA: 1.01 (ref 1.010–1.025)
Urobilinogen, UA: 0.2 E.U./dL
pH, UA: 5.5 (ref 5.0–8.0)

## 2021-01-20 LAB — COMPREHENSIVE METABOLIC PANEL
ALT: 9 U/L (ref 0–35)
AST: 15 U/L (ref 0–37)
Albumin: 4.3 g/dL (ref 3.5–5.2)
Alkaline Phosphatase: 51 U/L (ref 39–117)
BUN: 21 mg/dL (ref 6–23)
CO2: 28 mEq/L (ref 19–32)
Calcium: 9.5 mg/dL (ref 8.4–10.5)
Chloride: 102 mEq/L (ref 96–112)
Creatinine, Ser: 1.07 mg/dL (ref 0.40–1.20)
GFR: 47.18 mL/min — ABNORMAL LOW (ref >60.00)
Glucose, Bld: 80 mg/dL (ref 70–99)
Potassium: 3.8 mEq/L (ref 3.5–5.1)
Sodium: 141 mEq/L (ref 135–145)
Total Bilirubin: 0.4 mg/dL (ref 0.2–1.2)
Total Protein: 6.9 g/dL (ref 6.0–8.3)

## 2021-01-20 LAB — B12 AND FOLATE PANEL
Folate: 10.8 ng/mL (ref >5.9)
Vitamin B-12: 186 pg/mL — ABNORMAL LOW (ref 211–911)

## 2021-01-20 LAB — TSH: TSH: 4.45 u[IU]/mL (ref 0.35–5.50)

## 2021-01-20 LAB — HEMOGLOBIN A1C: Hgb A1c MFr Bld: 5.7 % (ref 4.6–6.5)

## 2021-01-20 LAB — VITAMIN D 25 HYDROXY (VIT D DEFICIENCY, FRACTURES): VITD: 18.86 ng/mL — ABNORMAL LOW (ref 30.00–100.00)

## 2021-01-20 NOTE — Progress Notes (Signed)
Established Patient Office Visit  Subjective:  Patient ID: Samantha Briggs, female    DOB: 10-28-1932  Age: 88 y.o. MRN: [id number]  CC:  Chief Complaint  Patient presents with   GI Problem   Headache    Started about 5 days ago    HPI [place] presents for   GI problem Upset stomach - "queasy" Has been going on 5-6 days. A few episodes of loose bowel movements without melena or hematochezia.  Aged out of colon ca screen. Notes mostly LRQ discomfort, wraps around towards lower right back Does not suspect msk etiology   Headache Frontal, new over past week or so.  Aching. Not throbbing.  Unsure of etiology No neuro symptoms or cognitive decline that she can tell.   Fatigue Ongoing moreso in the past few weeks.  No changes to diet, appetite, exercise.  Stays active with reading, playing cards, going out to eat.   Past Medical History:  Diagnosis Date   Allergy    Anemia    Depression    Glaucoma    Hyperlipidemia    Hypertension     Past Surgical History:  Procedure Laterality Date   broken foot Left    CHOLECYSTECTOMY     CORNEAL TRANSPLANT     x2   DILATION AND CURETTAGE OF UTERUS     RETINAL DETACHMENT SURGERY      Family History  Problem Relation Age of Onset   Cancer Mother    Breast cancer Mother    Cancer Father    Pancreatic cancer Father    Diabetes Sister     Social History   Socioeconomic History   Marital status: Married    Spouse name: Not on file   Number of children: Not on file   Years of education: Not on file   Highest education level: Not on file  Occupational History   Not on file  Tobacco Use   Smoking status: Former    Types: Cigarettes    Quit date: 01/04/1972    Years since quitting: 49.0   Smokeless tobacco: Never  Vaping Use   Vaping Use: Never used  Substance and Sexual Activity   Alcohol use: No   Drug use: No   Sexual activity: Not on file  Other Topics Concern   Not on file  Social  History Narrative   Not on file   Social Determinants of Health   Financial Resource Strain: Not on file  Food Insecurity: Not on file  Transportation Needs: Not on file  Physical Activity: Not on file  Stress: Not on file  Social Connections: Not on file  Intimate Partner Violence: Not on file    Outpatient Medications Prior to Visit  Medication Sig Dispense Refill   carbamide peroxide (DEBROX) 6.5 % OTIC solution Place 5 drops into both ears 2 (two) times daily. 15 mL 2   cetirizine (ZYRTEC) 10 MG tablet Take 10 mg by mouth daily.     dorzolamide (TRUSOPT) 2 % ophthalmic solution   11   erythromycin ophthalmic ointment   1   fenofibrate 160 MG tablet TAKE ONE TABLET BY MOUTH DAILY 90 tablet 0   ferrous sulfate 325 (65 FE) MG tablet Take 1 tablet (325 mg total) by mouth daily with breakfast. 30 tablet 6   fluticasone (FLONASE) 50 MCG/ACT nasal spray PLACE TWO SPRAYS INTO BOTH NOSTRILS DAILY 16 mL 4   hydrochlorothiazide (HYDRODIURIL) 12.5 MG tablet TAKE ONE TABLET BY MOUTH DAILY  90 tablet 0   latanoprost (XALATAN) 0.005 % ophthalmic solution Place 1 drop into the left eye daily.      levothyroxine (SYNTHROID) 88 MCG tablet TAKE ONE TABLET BY MOUTH DAILY 90 tablet 0   olmesartan (BENICAR) 40 MG tablet TAKE ONE TABLET BY MOUTH DAILY 83 tablet 0   omeprazole (PRILOSEC) 20 MG capsule TAKE ONE CAPSULE BY MOUTH DAILY 90 capsule 0   PRED MILD 0.12 % ophthalmic suspension Place 1 drop into both eyes.      sertraline (ZOLOFT) 100 MG tablet TAKE 1 TABLET (100 MG TOTAL) BY MOUTH DAILY. 90 tablet 0   XIIDRA 5 % SOLN   3   Colchicine 0.6 MG CAPS Take 0.6 mg by mouth daily for 12 doses. 12 capsule 5   No facility-administered medications prior to visit.    Allergies  Allergen Reactions   Morphine And Related Hives   Neurontin [Gabapentin]     hives    ROS Review of Systems  Constitutional:  Positive for fatigue.  HENT: Negative.    Eyes: Negative.   Respiratory: Negative.     Cardiovascular: Negative.   Gastrointestinal:  Positive for abdominal pain and diarrhea.  Genitourinary: Negative.   Musculoskeletal: Negative.   Skin: Negative.   Neurological: Negative.   Psychiatric/Behavioral: Negative.    All other systems reviewed and are negative.    Objective:    Physical Exam Vitals and nursing note reviewed.  Constitutional:      General: She is not in acute distress.    Appearance: Normal appearance. She is normal weight. She is not ill-appearing, toxic-appearing or diaphoretic.  Eyes:     General: No visual field deficit. Cardiovascular:     Rate and Rhythm: Normal rate and regular rhythm.     Heart sounds: Normal heart sounds. No murmur heard.   No friction rub. No gallop.  Pulmonary:     Effort: Pulmonary effort is normal. No respiratory distress.     Breath sounds: Normal breath sounds. No stridor. No wheezing, rhonchi or rales.  Chest:     Chest wall: No tenderness.  Abdominal:     General: Bowel sounds are normal.     Palpations: Abdomen is soft. There is no mass.     Tenderness: There is no guarding.  Skin:    General: Skin is warm and dry.  Neurological:     General: No focal deficit present.     Mental Status: She is alert and oriented to person, place, and time. Mental status is at baseline.     GCS: GCS eye subscore is 4. GCS verbal subscore is 5. GCS motor subscore is 6.     Cranial Nerves: No cranial nerve deficit, dysarthria or facial asymmetry.     Comments: Baseline neuro exam per previous exams.  Psychiatric:        Mood and Affect: Mood normal.        Behavior: Behavior normal.        Thought Content: Thought content normal.        Judgment: Judgment normal.    BP 132/84    Pulse 100    Temp 98.1 F (36.7 C)    Ht 5\' 6"  (1.676 m)    Wt 186 lb 3.2 oz (84.5 kg)    SpO2 90%    BMI 30.05 kg/m  Wt Readings from Last 3 Encounters:  01/20/21 186 lb 3.2 oz (84.5 kg)  09/17/20 182 lb 6.4 oz (82.7 kg)  08/24/20 183  lb 9.6 oz  (83.3 kg)     There are no preventive care reminders to display for this patient.  There are no preventive care reminders to display for this patient.  Lab Results  Component Value Date   TSH 4.52 09/17/2020   Lab Results  Component Value Date   WBC 3.9 (L) 09/17/2020   HGB 11.6 (L) 09/17/2020   HCT 36.3 09/17/2020   MCV 82.3 09/17/2020   PLT 145.0 (L) 09/17/2020   Lab Results  Component Value Date   NA 142 09/17/2020   K 4.0 09/17/2020   CO2 30 09/17/2020   GLUCOSE 92 09/17/2020   BUN 26 (H) 09/17/2020   CREATININE 1.10 09/17/2020   BILITOT 0.4 09/17/2020   ALKPHOS 48 09/17/2020   AST 13 09/17/2020   ALT 8 09/17/2020   PROT 6.9 09/17/2020   ALBUMIN 4.2 09/17/2020   CALCIUM 9.7 09/17/2020   GFR 45.75 (L) 09/17/2020   Lab Results  Component Value Date   CHOL 191 09/17/2020   Lab Results  Component Value Date   HDL 38.70 (L) 09/17/2020   Lab Results  Component Value Date   LDLCALC 115 (H) 09/17/2020   Lab Results  Component Value Date   TRIG 187.0 (H) 09/17/2020   Lab Results  Component Value Date   CHOLHDL 5 09/17/2020   No results found for: HGBA1C    Assessment & Plan:   Problem List Items Addressed This Visit   None Visit Diagnoses     Right flank pain    -  Primary   Relevant Orders   POCT Urinalysis Dipstick (Completed)   CBC with Differential/Platelet   Comprehensive metabolic panel   Hemoglobin A1c   Lipid panel   TSH   B12 and Folate Panel   Vitamin D (25 hydroxy)   Urine Culture   Other fatigue       Relevant Orders   CBC with Differential/Platelet   Comprehensive metabolic panel   Hemoglobin A1c   Lipid panel   TSH   B12 and Folate Panel   Vitamin D (25 hydroxy)   Estrogen deficiency       Relevant Orders   Vitamin D (25 hydroxy)   New onset headache       Relevant Orders   CBC with Differential/Platelet   Comprehensive metabolic panel   Hemoglobin A1c   Lipid panel   TSH   B12 and Folate Panel   Vitamin D (25  hydroxy)       No orders of the defined types were placed in this encounter.   Follow-up: Return if symptoms worsen or fail to improve.   PLAN POCT UA shows no evidence of acute UTI. Labs collected. Will follow up with the patient as warranted. No clear etiology at this time. Pending labs, can consider pursuing CT abd/pelv w/wo contrast. If headache persists, can pursue CT head r/o stroke. Recommend tylenol 500mg  po q6h prn   Patient encouraged to call clinic with any questions, comments, or concerns.  Janeece Ageeichard Shanterica Biehler, NP

## 2021-01-20 NOTE — Patient Instructions (Signed)
Ms. Samantha Briggs to see you!  Let's see how labs look.  You can use tylenol 500-1000mg  every 8 hours or so for pain.  I'll call you with results.   Thank you  Rich

## 2021-01-21 ENCOUNTER — Other Ambulatory Visit: Payer: Self-pay | Admitting: Registered Nurse

## 2021-01-21 DIAGNOSIS — E559 Vitamin D deficiency, unspecified: Secondary | ICD-10-CM

## 2021-01-21 MED ORDER — CHOLECALCIFEROL 1.25 MG (50000 UT) PO CAPS: 50000.0000 [IU] | ORAL_CAPSULE | ORAL | 0 refills | Status: DC

## 2021-01-22 LAB — URINE CULTURE
MICRO NUMBER:: 12890234
SPECIMEN QUALITY:: ADEQUATE

## 2021-01-23 ENCOUNTER — Encounter: Payer: Self-pay | Admitting: Registered Nurse

## 2021-01-23 ENCOUNTER — Other Ambulatory Visit: Payer: Self-pay | Admitting: Registered Nurse

## 2021-01-23 DIAGNOSIS — N3000 Acute cystitis without hematuria: Secondary | ICD-10-CM

## 2021-01-23 MED ORDER — SULFAMETHOXAZOLE-TRIMETHOPRIM 800-160 MG PO TABS: 1.0000 | ORAL_TABLET | Freq: Two times a day (BID) | ORAL | 0 refills | Status: DC

## 2021-01-29 ENCOUNTER — Other Ambulatory Visit: Payer: Self-pay | Admitting: Family Medicine

## 2021-02-03 ENCOUNTER — Other Ambulatory Visit: Payer: Self-pay | Admitting: Family Medicine

## 2021-03-11 ENCOUNTER — Other Ambulatory Visit: Payer: Self-pay | Admitting: Family Medicine

## 2021-03-11 NOTE — Telephone Encounter (Signed)
Pt called in asking for a refill on olmesartan, pt states she is leaving to go out of town  tomorrow and wanted to take the medication with her. ? ?Pcp has been updated to another provider in the system, I didn't notice this until I had already gotten off the phone with the pt  ? ?Please advise if we can send in the refill to [facility]  ?

## 2021-03-17 ENCOUNTER — Ambulatory Visit: Payer: Medicare Other | Admitting: Family Medicine

## 2021-03-22 ENCOUNTER — Other Ambulatory Visit: Payer: Self-pay

## 2021-03-22 ENCOUNTER — Encounter: Payer: Self-pay | Admitting: Family Medicine

## 2021-03-22 ENCOUNTER — Ambulatory Visit (INDEPENDENT_AMBULATORY_CARE_PROVIDER_SITE_OTHER): Payer: Medicare Other | Admitting: Family Medicine

## 2021-03-22 VITALS — BP 140/82 | HR 72 | Temp 97.3°F | Ht 66.0 in | Wt 182.6 lb

## 2021-03-22 DIAGNOSIS — J302 Other seasonal allergic rhinitis: Secondary | ICD-10-CM | POA: Diagnosis not present

## 2021-03-22 MED ORDER — PREDNISONE 5 MG PO TABS: 5.0000 mg | ORAL_TABLET | Freq: Every day | ORAL | 0 refills | Status: AC

## 2021-03-22 NOTE — Progress Notes (Signed)
? ?Established Patient Office Visit ? ?Subjective:  ?Patient ID: Samantha Briggs, female    DOB: June 15, 1932  Age: 88 y.o. MRN: [id number] ? ?CC:  ?Chief Complaint  ?Patient presents with  ? Transitions Of Care  ?  TOC from Dr. Beverely Low, no concerns. Would like something for allergies.   ? ? ?HPI ?Samantha Briggs presents for evaluation and treatment of headache, sneeze, nasal congestion, clear rhinorrhea, postnasal drip, itchy watery eyes ears nose and throat.  History of the symptoms each spring.  Worse since she moved here from [place].  She uses Flonase as needed.  Has not been taking her cetirizine. ? ?Past Medical History:  ?Diagnosis Date  ? Allergy   ? Anemia   ? Depression   ? Glaucoma   ? Hyperlipidemia   ? Hypertension   ? ? ?Past Surgical History:  ?Procedure Laterality Date  ? broken foot Left   ? CHOLECYSTECTOMY    ? CORNEAL TRANSPLANT    ? x2  ? DILATION AND CURETTAGE OF UTERUS    ? RETINAL DETACHMENT SURGERY    ? ? ?Family History  ?Problem Relation Age of Onset  ? Cancer Mother   ? Breast cancer Mother   ? Cancer Father   ? Pancreatic cancer Father   ? Diabetes Sister   ? ? ?Social History  ? ?Socioeconomic History  ? Marital status: Married  ?  Spouse name: Not on file  ? Number of children: Not on file  ? Years of education: Not on file  ? Highest education level: Not on file  ?Occupational History  ? Not on file  ?Tobacco Use  ? Smoking status: Former  ?  Types: Cigarettes  ?  Quit date: 01/04/1972  ?  Years since quitting: 49.2  ? Smokeless tobacco: Never  ?Vaping Use  ? Vaping Use: Never used  ?Substance and Sexual Activity  ? Alcohol use: No  ? Drug use: No  ? Sexual activity: Not on file  ?Other Topics Concern  ? Not on file  ?Social History Narrative  ? Not on file  ? ?Social Determinants of Health  ? ?Financial Resource Strain: Not on file  ?Food Insecurity: Not on file  ?Transportation Needs: Not on file  ?Physical Activity: Not on file  ?Stress: Not on file  ?Social Connections:  Not on file  ?Intimate Partner Violence: Not on file  ? ? ?Outpatient Medications Prior to Visit  ?Medication Sig Dispense Refill  ? cetirizine (ZYRTEC) 10 MG tablet Take 10 mg by mouth daily.    ? Cholecalciferol 1.25 MG (50000 UT) capsule Take 1 capsule (50,000 Units total) by mouth once a week. 10 capsule 0  ? dorzolamide (TRUSOPT) 2 % ophthalmic solution   11  ? erythromycin ophthalmic ointment   1  ? fenofibrate 160 MG tablet TAKE ONE TABLET BY MOUTH DAILY 53 tablet 0  ? ferrous sulfate 325 (65 FE) MG tablet Take 1 tablet (325 mg total) by mouth daily with breakfast. 30 tablet 6  ? fluticasone (FLONASE) 50 MCG/ACT nasal spray PLACE TWO SPRAYS INTO BOTH NOSTRILS DAILY 16 mL 4  ? hydrochlorothiazide (HYDRODIURIL) 12.5 MG tablet TAKE ONE TABLET BY MOUTH DAILY 90 tablet 0  ? latanoprost (XALATAN) 0.005 % ophthalmic solution Place 1 drop into the left eye daily.     ? levothyroxine (SYNTHROID) 88 MCG tablet TAKE ONE TABLET BY MOUTH DAILY 90 tablet 0  ? olmesartan (BENICAR) 40 MG tablet TAKE ONE TABLET BY MOUTH DAILY 83  tablet 0  ? omeprazole (PRILOSEC) 20 MG capsule TAKE ONE CAPSULE BY MOUTH DAILY 90 capsule 0  ? PRED MILD 0.12 % ophthalmic suspension Place 1 drop into both eyes.     ? sertraline (ZOLOFT) 100 MG tablet TAKE 1 TABLET (100 MG TOTAL) BY MOUTH DAILY. 90 tablet 0  ? sulfamethoxazole-trimethoprim (BACTRIM DS) 800-160 MG tablet Take 1 tablet by mouth 2 (two) times daily. 6 tablet 0  ? carbamide peroxide (DEBROX) 6.5 % OTIC solution Place 5 drops into both ears 2 (two) times daily. 15 mL 2  ? Colchicine 0.6 MG CAPS Take 0.6 mg by mouth daily for 12 doses. 12 capsule 5  ? XIIDRA 5 % SOLN   3  ? ?No facility-administered medications prior to visit.  ? ? ?Allergies  ?Allergen Reactions  ? Morphine And Related Hives  ? Neurontin [Gabapentin]   ?  hives  ? ? ?ROS ?Review of Systems  ?Constitutional:  Negative for chills, diaphoresis, fatigue, fever and unexpected weight change.  ?HENT:  Positive for congestion,  postnasal drip, rhinorrhea and sneezing.   ?Eyes:  Negative for photophobia and visual disturbance.  ?Respiratory:  Negative for cough, shortness of breath and wheezing.   ?Cardiovascular: Negative.   ?Neurological:  Positive for headaches. Negative for speech difficulty and weakness.  ? ?  ?Objective:  ?  ?Physical Exam ?Vitals and nursing note reviewed.  ?Constitutional:   ?   General: She is not in acute distress. ?   Appearance: Normal appearance. She is not ill-appearing, toxic-appearing or diaphoretic.  ?HENT:  ?   Head: Normocephalic and atraumatic.  ?   Right Ear: Tympanic membrane, ear canal and external ear normal.  ?   Left Ear: Tympanic membrane, ear canal and external ear normal.  ?   Mouth/Throat:  ?   Mouth: Mucous membranes are moist.  ?   Pharynx: Oropharynx is clear. No oropharyngeal exudate or posterior oropharyngeal erythema.  ?Eyes:  ?   General: No scleral icterus.    ?   Right eye: No discharge.     ?   Left eye: No discharge.  ?   Extraocular Movements: Extraocular movements intact.  ?   Conjunctiva/sclera: Conjunctivae normal.  ?   Pupils: Pupils are equal, round, and reactive to light.  ?Cardiovascular:  ?   Rate and Rhythm: Normal rate and regular rhythm.  ?Pulmonary:  ?   Effort: Pulmonary effort is normal. No respiratory distress.  ?   Breath sounds: Normal breath sounds. No wheezing or rales.  ?Abdominal:  ?   General: Bowel sounds are normal.  ?Musculoskeletal:  ?   Cervical back: No rigidity or tenderness.  ?Lymphadenopathy:  ?   Cervical: No cervical adenopathy.  ?Neurological:  ?   Mental Status: She is alert and oriented to person, place, and time.  ?Psychiatric:     ?   Mood and Affect: Mood normal.     ?   Behavior: Behavior normal.  ? ? ?BP 140/82 (BP Location: Left Arm, Patient Position: Sitting, Cuff Size: Normal)   Pulse 72   Temp (!) 97.3 ?F (36.3 ?C) (Temporal)   Ht 5\' 6"  (1.676 m)   Wt 182 lb 9.6 oz (82.8 kg)   SpO2 96%   BMI 29.47 kg/m?  ?Wt Readings from Last 3  Encounters:  ?03/22/21 182 lb 9.6 oz (82.8 kg)  ?01/20/21 186 lb 3.2 oz (84.5 kg)  ?09/17/20 182 lb 6.4 oz (82.7 kg)  ? ? ? ?There are no  preventive care reminders to display for this patient. ? ?There are no preventive care reminders to display for this patient. ? ?Lab Results  ?Component Value Date  ? TSH 4.45 01/20/2021  ? ?Lab Results  ?Component Value Date  ? WBC 4.7 01/20/2021  ? HGB 11.7 (L) 01/20/2021  ? HCT 36.7 01/20/2021  ? MCV 82.6 01/20/2021  ? PLT 177.0 01/20/2021  ? ?Lab Results  ?Component Value Date  ? NA 141 01/20/2021  ? K 3.8 01/20/2021  ? CO2 28 01/20/2021  ? GLUCOSE 80 01/20/2021  ? BUN 21 01/20/2021  ? CREATININE 1.07 01/20/2021  ? BILITOT 0.4 01/20/2021  ? ALKPHOS 51 01/20/2021  ? AST 15 01/20/2021  ? ALT 9 01/20/2021  ? PROT 6.9 01/20/2021  ? ALBUMIN 4.3 01/20/2021  ? CALCIUM 9.5 01/20/2021  ? GFR 47.18 (L) 01/20/2021  ? ?Lab Results  ?Component Value Date  ? CHOL 194 01/20/2021  ? ?Lab Results  ?Component Value Date  ? HDL 38.10 (L) 01/20/2021  ? ?Lab Results  ?Component Value Date  ? LDLCALC 117 (H) 01/20/2021  ? ?Lab Results  ?Component Value Date  ? TRIG 197.0 (H) 01/20/2021  ? ?Lab Results  ?Component Value Date  ? CHOLHDL 5 01/20/2021  ? ?Lab Results  ?Component Value Date  ? HGBA1C 5.7 01/20/2021  ? ? ?  ?Assessment & Plan:  ? ?Problem List Items Addressed This Visit   ?None ?Visit Diagnoses   ? ? Seasonal allergic rhinitis, unspecified trigger    -  Primary  ? Relevant Medications  ? predniSONE (DELTASONE) 5 MG tablet  ? ?  ? ? ?Meds ordered this encounter  ?Medications  ? predniSONE (DELTASONE) 5 MG tablet  ?  Sig: Take 1 tablet (5 mg total) by mouth daily with breakfast for 10 days.  ?  Dispense:  10 tablet  ?  Refill:  0  ? ? ?Follow-up: Return if symptoms worsen or fail to improve.  ?Low-dose prednisone until Flonase becomes effective.  She will use the Flonase daily 2 sprays of each nare.  She will take Zyrtec at nighttime.  Return ? ?Mliss SaxWilliam Alfred Rhodie Cienfuegos, MD ?

## 2021-03-29 ENCOUNTER — Other Ambulatory Visit: Payer: Self-pay | Admitting: Family Medicine

## 2021-04-07 ENCOUNTER — Other Ambulatory Visit: Payer: Self-pay | Admitting: Family Medicine

## 2021-04-19 ENCOUNTER — Other Ambulatory Visit: Payer: Self-pay | Admitting: Family Medicine

## 2021-04-21 ENCOUNTER — Telehealth: Payer: Self-pay | Admitting: Family Medicine

## 2021-04-21 DIAGNOSIS — H401123 Primary open-angle glaucoma, left eye, severe stage: Secondary | ICD-10-CM | POA: Diagnosis not present

## 2021-04-21 DIAGNOSIS — E038 Other specified hypothyroidism: Secondary | ICD-10-CM

## 2021-04-21 DIAGNOSIS — Z961 Presence of intraocular lens: Secondary | ICD-10-CM | POA: Diagnosis not present

## 2021-04-21 DIAGNOSIS — E781 Pure hyperglyceridemia: Secondary | ICD-10-CM

## 2021-04-21 DIAGNOSIS — I10 Essential (primary) hypertension: Secondary | ICD-10-CM

## 2021-04-21 MED ORDER — FENOFIBRATE 160 MG PO TABS: 160.0000 mg | ORAL_TABLET | Freq: Every day | ORAL | 0 refills | Status: DC

## 2021-04-21 MED ORDER — LEVOTHYROXINE SODIUM 88 MCG PO TABS: 88.0000 ug | ORAL_TABLET | Freq: Every day | ORAL | 0 refills | Status: DC

## 2021-04-21 MED ORDER — OLMESARTAN MEDOXOMIL 40 MG PO TABS: 40.0000 mg | ORAL_TABLET | Freq: Every day | ORAL | 0 refills | Status: DC

## 2021-04-21 MED ORDER — HYDROCHLOROTHIAZIDE 12.5 MG PO TABS: 12.5000 mg | ORAL_TABLET | Freq: Every day | ORAL | 0 refills | Status: DC

## 2021-04-21 NOTE — Telephone Encounter (Signed)
Caller Name: Talley Edmiston ?Call back phone #: [phone] ? ?MEDICATION(S):  ?olmesartan (BENICAR) 40 MG tablet [Pharmacy Med Name: OLMESARTAN MEDOXOMIL 40MG  TAB]  ?levothyroxine (SYNTHROID) 88 MCG tablet [Pharmacy Med Name: LEVOTHYROXINE SODIUM [[id number]] TAB]  ?hydrochlorothiazide (HYDRODIURIL) 12.5 MG tablet [Pharmacy Med Name: HYDROCHLOROTHIAZIDE 12.5MG  TAB] [[id number]]  ?fenofibrate 160 MG tablet [[id number]]  ?Days of Med Remaining: tomorrow 04/22/21 is her last day ? ?Has the patient contacted their pharmacy (YES/NO)?  Yes  ?IF YES, when and what did the pharmacy advise? Contact your pcp ? ? ?Preferred Pharmacy: [facility] [place], [place] [address] [place]  ?[address] [place], [place]  ?Phone:  [phone]  Fax:  [phone]  ? ? ?

## 2021-04-21 NOTE — Telephone Encounter (Signed)
Refill sent in

## 2021-04-27 ENCOUNTER — Ambulatory Visit (INDEPENDENT_AMBULATORY_CARE_PROVIDER_SITE_OTHER): Payer: Medicare Other

## 2021-04-27 DIAGNOSIS — Z780 Asymptomatic menopausal state: Secondary | ICD-10-CM | POA: Diagnosis not present

## 2021-04-27 DIAGNOSIS — Z0000 Encounter for general adult medical examination without abnormal findings: Secondary | ICD-10-CM

## 2021-04-27 NOTE — Patient Instructions (Signed)
Samantha Briggs , ?Thank you for taking time to come for your Medicare Wellness Visit. I appreciate your ongoing commitment to your health goals. Please review the following plan we discussed and let me know if I can assist you in the future.  ? ?Screening recommendations/referrals: ?Colonoscopy: no longer required  ?Mammogram: no longer required  ?Bone Density: referral 04/27/2021 ?Recommended yearly ophthalmology/optometry visit for glaucoma screening and checkup ?Recommended yearly dental visit for hygiene and checkup ? ?Vaccinations: ?Influenza vaccine: completed  ?Pneumococcal vaccine: completed  ?Tdap vaccine: 10/28/2013 ?Shingles vaccine: completed    ? ?Advanced directives: yes  ? ?Conditions/risks identified: none  ? ?Next appointment: none  ? ? ?Preventive Care 1 Years and Older, Female ?Preventive care refers to lifestyle choices and visits with your health care provider that can promote health and wellness. ?What does preventive care include? ?A yearly physical exam. This is also called an annual well check. ?Dental exams once or twice a year. ?Routine eye exams. Ask your health care provider how often you should have your eyes checked. ?Personal lifestyle choices, including: ?Daily care of your teeth and gums. ?Regular physical activity. ?Eating a healthy diet. ?Avoiding tobacco and drug use. ?Limiting alcohol use. ?Practicing safe sex. ?Taking low-dose aspirin every day. ?Taking vitamin and mineral supplements as recommended by your health care provider. ?What happens during an annual well check? ?The services and screenings done by your health care provider during your annual well check will depend on your age, overall health, lifestyle risk factors, and family history of disease. ?Counseling  ?Your health care provider may ask you questions about your: ?Alcohol use. ?Tobacco use. ?Drug use. ?Emotional well-being. ?Home and relationship well-being. ?Sexual activity. ?Eating habits. ?History of  falls. ?Memory and ability to understand (cognition). ?Work and work Astronomer. ?Reproductive health. ?Screening  ?You may have the following tests or measurements: ?Height, weight, and BMI. ?Blood pressure. ?Lipid and cholesterol levels. These may be checked every 5 years, or more frequently if you are over 28 years old. ?Skin check. ?Lung cancer screening. You may have this screening every year starting at age 23 if you have a 30-pack-year history of smoking and currently smoke or have quit within the past 15 years. ?Fecal occult blood test (FOBT) of the stool. You may have this test every year starting at age 69. ?Flexible sigmoidoscopy or colonoscopy. You may have a sigmoidoscopy every 5 years or a colonoscopy every 10 years starting at age 8. ?Hepatitis C blood test. ?Hepatitis B blood test. ?Sexually transmitted disease (STD) testing. ?Diabetes screening. This is done by checking your blood sugar (glucose) after you have not eaten for a while (fasting). You may have this done every 1-3 years. ?Bone density scan. This is done to screen for osteoporosis. You may have this done starting at age 51. ?Mammogram. This may be done every 1-2 years. Talk to your health care provider about how often you should have regular mammograms. ?Talk with your health care provider about your test results, treatment options, and if necessary, the need for more tests. ?Vaccines  ?Your health care provider may recommend certain vaccines, such as: ?Influenza vaccine. This is recommended every year. ?Tetanus, diphtheria, and acellular pertussis (Tdap, Td) vaccine. You may need a Td booster every 10 years. ?Zoster vaccine. You may need this after age 63. ?Pneumococcal 13-valent conjugate (PCV13) vaccine. One dose is recommended after age 18. ?Pneumococcal polysaccharide (PPSV23) vaccine. One dose is recommended after age 68. ?Talk to your health care provider about which screenings and  vaccines you need and how often you need  them. ?This information is not intended to replace advice given to you by your health care provider. Make sure you discuss any questions you have with your health care provider. ?Document Released: 01/16/2015 Document Revised: 09/09/2015 Document Reviewed: 10/21/2014 ?Elsevier Interactive Patient Education ? 2017 [place]. ? ?Fall Prevention in the Home ?Falls can cause injuries. They can happen to people of all ages. There are many things you can do to make your home safe and to help prevent falls. ?What can I do on the outside of my home? ?Regularly fix the edges of walkways and driveways and fix any cracks. ?Remove anything that might make you trip as you walk through a door, such as a raised step or threshold. ?Trim any bushes or trees on the path to your home. ?Use bright outdoor lighting. ?Clear any walking paths of anything that might make someone trip, such as rocks or tools. ?Regularly check to see if handrails are loose or broken. Make sure that both sides of any steps have handrails. ?Any raised decks and porches should have guardrails on the edges. ?Have any leaves, snow, or ice cleared regularly. ?Use sand or salt on walking paths during winter. ?Clean up any spills in your garage right away. This includes oil or grease spills. ?What can I do in the bathroom? ?Use night lights. ?Install grab bars by the toilet and in the tub and shower. Do not use towel bars as grab bars. ?Use non-skid mats or decals in the tub or shower. ?If you need to sit down in the shower, use a plastic, non-slip stool. ?Keep the floor dry. Clean up any water that spills on the floor as soon as it happens. ?Remove soap buildup in the tub or shower regularly. ?Attach bath mats securely with double-sided non-slip rug tape. ?Do not have throw rugs and other things on the floor that can make you trip. ?What can I do in the bedroom? ?Use night lights. ?Make sure that you have a light by your bed that is easy to reach. ?Do not use  any sheets or blankets that are too big for your bed. They should not hang down onto the floor. ?Have a firm chair that has side arms. You can use this for support while you get dressed. ?Do not have throw rugs and other things on the floor that can make you trip. ?What can I do in the kitchen? ?Clean up any spills right away. ?Avoid walking on wet floors. ?Keep items that you use a lot in easy-to-reach places. ?If you need to reach something above you, use a strong step stool that has a grab bar. ?Keep electrical cords out of the way. ?Do not use floor polish or wax that makes floors slippery. If you must use wax, use non-skid floor wax. ?Do not have throw rugs and other things on the floor that can make you trip. ?What can I do with my stairs? ?Do not leave any items on the stairs. ?Make sure that there are handrails on both sides of the stairs and use them. Fix handrails that are broken or loose. Make sure that handrails are as long as the stairways. ?Check any carpeting to make sure that it is firmly attached to the stairs. Fix any carpet that is loose or worn. ?Avoid having throw rugs at the top or bottom of the stairs. If you do have throw rugs, attach them to the floor with carpet tape. ?Make  sure that you have a light switch at the top of the stairs and the bottom of the stairs. If you do not have them, ask someone to add them for you. ?What else can I do to help prevent falls? ?Wear shoes that: ?Do not have high heels. ?Have rubber bottoms. ?Are comfortable and fit you well. ?Are closed at the toe. Do not wear sandals. ?If you use a stepladder: ?Make sure that it is fully opened. Do not climb a closed stepladder. ?Make sure that both sides of the stepladder are locked into place. ?Ask someone to hold it for you, if possible. ?Clearly mark and make sure that you can see: ?Any grab bars or handrails. ?First and last steps. ?Where the edge of each step is. ?Use tools that help you move around (mobility aids)  if they are needed. These include: ?Canes. ?Walkers. ?Scooters. ?Crutches. ?Turn on the lights when you go into a dark area. Replace any light bulbs as soon as they burn out. ?Set up your furniture so you have a clear p

## 2021-04-27 NOTE — Progress Notes (Signed)
? ?Subjective:  ? Samantha Briggs is a 88 y.o. female who presents for Medicare Annual (Subsequent) preventive examination. ? ? ?I connected with Samantha Briggs today by telephone and verified that I am speaking with the correct person using two identifiers. ?Location patient: home ?Location provider: work ?Persons participating in the virtual visit: patient, provider. ?  ?I discussed the limitations, risks, security and privacy concerns of performing an evaluation and management service by telephone and the availability of in person appointments. I also discussed with the patient that there may be a patient responsible charge related to this service. The patient expressed understanding and verbally consented to this telephonic visit.  ?  ?Interactive audio and video telecommunications were attempted between this provider and patient, however failed, due to patient having technical difficulties OR patient did not have access to video capability.  We continued and completed visit with audio only. ? ?  ?Review of Systems    ? ?Cardiac Risk Factors include: advanced age (>79men, >20 women) ? ?   ?Objective:  ?  ?Today's Vitals  ? ?There is no height or weight on file to calculate BMI. ? ? ?  02/09/2018  ?  4:50 PM 09/15/2016  ? 10:43 AM 12/23/2014  ?  9:01 AM 12/07/2014  ?  8:47 AM  ?Advanced Directives  ?Does Patient Have a Medical Advance Directive? No Yes Yes No  ?Type of Advance Directive  Living will;Healthcare Power of [employer] Power of [place];Living will   ?Does patient want to make changes to medical advance directive?   No - Patient declined   ?Copy of Healthcare Power of Attorney in Chart?  Yes    ?Would patient like information on creating a medical advance directive? No - Patient declined   No - patient declined information  ? ? ?Current Medications (verified) ?Outpatient Encounter Medications as of 04/27/2021  ?Medication Sig  ? cetirizine (ZYRTEC) 10 MG tablet Take 10 mg by mouth daily.  ?  Cholecalciferol 1.25 MG (50000 UT) capsule Take 1 capsule (50,000 Units total) by mouth once a week.  ? dorzolamide (TRUSOPT) 2 % ophthalmic solution   ? erythromycin ophthalmic ointment   ? fenofibrate 160 MG tablet Take 1 tablet (160 mg total) by mouth daily.  ? ferrous sulfate 325 (65 FE) MG tablet Take 1 tablet (325 mg total) by mouth daily with breakfast.  ? fluticasone (FLONASE) 50 MCG/ACT nasal spray PLACE TWO SPRAYS INTO BOTH NOSTRILS DAILY  ? hydrochlorothiazide (HYDRODIURIL) 12.5 MG tablet Take 1 tablet (12.5 mg total) by mouth daily.  ? latanoprost (XALATAN) 0.005 % ophthalmic solution Place 1 drop into the left eye daily.   ? levothyroxine (SYNTHROID) 88 MCG tablet Take 1 tablet (88 mcg total) by mouth daily.  ? olmesartan (BENICAR) 40 MG tablet Take 1 tablet (40 mg total) by mouth daily.  ? omeprazole (PRILOSEC) 20 MG capsule TAKE ONE CAPSULE BY MOUTH DAILY  ? PRED MILD 0.12 % ophthalmic suspension Place 1 drop into both eyes.   ? sertraline (ZOLOFT) 100 MG tablet TAKE 1 TABLET (100 MG TOTAL) BY MOUTH DAILY.  ? sulfamethoxazole-trimethoprim (BACTRIM DS) 800-160 MG tablet Take 1 tablet by mouth 2 (two) times daily. (Patient not taking: Reported on 04/27/2021)  ? ?No facility-administered encounter medications on file as of 04/27/2021.  ? ? ?Allergies (verified) ?Morphine and related and Neurontin [gabapentin]  ? ?History: ?Past Medical History:  ?Diagnosis Date  ? Allergy   ? Anemia   ? Depression   ? Glaucoma   ?  Hyperlipidemia   ? Hypertension   ? ?Past Surgical History:  ?Procedure Laterality Date  ? broken foot Left   ? CHOLECYSTECTOMY    ? CORNEAL TRANSPLANT    ? x2  ? DILATION AND CURETTAGE OF UTERUS    ? RETINAL DETACHMENT SURGERY    ? ?Family History  ?Problem Relation Age of Onset  ? Cancer Mother   ? Breast cancer Mother   ? Cancer Father   ? Pancreatic cancer Father   ? Diabetes Sister   ? ?Social History  ? ?Socioeconomic History  ? Marital status: Married  ?  Spouse name: Not on file  ? Number  of children: Not on file  ? Years of education: Not on file  ? Highest education level: Not on file  ?Occupational History  ? Not on file  ?Tobacco Use  ? Smoking status: Former  ?  Types: Cigarettes  ?  Quit date: 01/04/1972  ?  Years since quitting: 49.3  ? Smokeless tobacco: Never  ?Vaping Use  ? Vaping Use: Never used  ?Substance and Sexual Activity  ? Alcohol use: No  ? Drug use: No  ? Sexual activity: Not on file  ?Other Topics Concern  ? Not on file  ?Social History Narrative  ? Not on file  ? ?Social Determinants of Health  ? ?Financial Resource Strain: Low Risk   ? Difficulty of Paying Living Expenses: Not hard at all  ?Food Insecurity: No Food Insecurity  ? Worried About Programme researcher, broadcasting/film/video in the Last Year: Never true  ? Ran Out of Food in the Last Year: Never true  ?Transportation Needs: No Transportation Needs  ? Lack of Transportation (Medical): No  ? Lack of Transportation (Non-Medical): No  ?Physical Activity: Insufficiently Active  ? Days of Exercise per Week: 3 days  ? Minutes of Exercise per Session: 30 min  ?Stress: No Stress Concern Present  ? Feeling of Stress : Not at all  ?Social Connections: Moderately Integrated  ? Frequency of Communication with Friends and Family: Three times a week  ? Frequency of Social Gatherings with Friends and Family: Three times a week  ? Attends Religious Services: More than 4 times per year  ? Active Member of Clubs or Organizations: Yes  ? Attends Banker Meetings: More than 4 times per year  ? Marital Status: Widowed  ? ? ?Tobacco Counseling ?Counseling given: Not Answered ? ? ?Clinical Intake: ? ?Pre-visit preparation completed: Yes ? ?Pain : No/denies pain ? ?  ? ?Nutritional Risks: None ? ?How often do you need to have someone help you when you read instructions, pamphlets, or other written materials from your doctor or pharmacy?: 1 - Never ?What is the last grade level you completed in school?: High School ? ?Diabetic?no  ? ?Interpreter Needed?:  No ? ?Information entered by :: L>Sherylann Vangorden,LPN ? ? ?Activities of Daily Living ? ?  04/27/2021  ? 12:56 PM  ?In your present state of health, do you have any difficulty performing the following activities:  ?Hearing? 0  ?Vision? 0  ?Difficulty concentrating or making decisions? 0  ?Walking or climbing stairs? 0  ?Dressing or bathing? 0  ?Doing errands, shopping? 0  ?Preparing Food and eating ? N  ?Using the Toilet? N  ?In the past six months, have you accidently leaked urine? N  ?Do you have problems with loss of bowel control? N  ?Managing your Medications? N  ?Managing your Finances? N  ?Housekeeping or managing your  Housekeeping? N  ? ? ?Patient Care Team: ?Mliss SaxKremer, William Alfred, MD as PCP - General (Family Medicine) ?Antony ContrasLyles, Graham, MD as Consulting Physician (Ophthalmology) ?Dahlia ByesPotts, Jacob, [facility] Center[facility] as Pharmacist (Pharmacist) ? ?Indicate any recent Medical Services you may have received from other than [facility] providers in the past year (date may be approximate). ? ?   ?Assessment:  ? This is a routine wellness examination for Samantha Briggs. ? ?Hearing/Vision screen ?Vision Screening - Comments:: Annual eye exams wears glasses  ? ?Dietary issues and exercise activities discussed: ?Current Exercise Habits: Home exercise routine, Type of exercise: strength training/weights, Time (Minutes): 30, Frequency (Times/Week): 3, Weekly Exercise (Minutes/Week): 90, Intensity: Mild, Exercise limited by: None identified ? ? Goals Addressed   ? ?  ?  ?  ?  ?  ? This Visit's Progress  ?   patient (pt-stated)   On track  ?   Maintain current health, begin water coloring again.  ? ?  ? ?  ? ?Depression Screen ? ?  04/27/2021  ? 12:56 PM 04/27/2021  ? 12:51 PM 03/22/2021  ?  1:52 PM 01/20/2021  ?  2:39 PM 11/11/2020  ? 11:51 AM 09/17/2020  ?  9:02 AM 08/10/2020  ?  2:16 PM  ?PHQ 2/9 Scores  ?PHQ - 2 Score 0 0 0 1 0 0 0  ?PHQ- 9 Score    5 0 2   ?  ?Fall Risk ? ?  04/27/2021  ? 12:53 PM 03/22/2021  ?  1:53 PM 11/11/2020  ? 11:51 AM 09/17/2020  ?  9:02 AM  08/10/2020  ?  2:16 PM  ?Fall Risk   ?Falls in the past year? 0 0 0 1 0  ?Number falls in past yr: 0 0 0 0   ?Injury with Fall? 0  0 1   ?Risk for fall due to :   No Fall Risks History of fall(s)   ?Follow

## 2021-05-01 ENCOUNTER — Other Ambulatory Visit: Payer: Self-pay | Admitting: Family Medicine

## 2021-05-05 ENCOUNTER — Other Ambulatory Visit: Payer: Self-pay | Admitting: Family Medicine

## 2021-05-10 ENCOUNTER — Other Ambulatory Visit: Payer: Self-pay

## 2021-05-10 DIAGNOSIS — K2100 Gastro-esophageal reflux disease with esophagitis, without bleeding: Secondary | ICD-10-CM

## 2021-05-10 DIAGNOSIS — H1820 Unspecified corneal edema: Secondary | ICD-10-CM | POA: Diagnosis not present

## 2021-05-10 DIAGNOSIS — H1789 Other corneal scars and opacities: Secondary | ICD-10-CM | POA: Diagnosis not present

## 2021-05-10 DIAGNOSIS — H401123 Primary open-angle glaucoma, left eye, severe stage: Secondary | ICD-10-CM | POA: Diagnosis not present

## 2021-05-10 DIAGNOSIS — H2000 Unspecified acute and subacute iridocyclitis: Secondary | ICD-10-CM | POA: Diagnosis not present

## 2021-05-10 MED ORDER — OMEPRAZOLE 20 MG PO CPDR: 20.0000 mg | DELAYED_RELEASE_CAPSULE | Freq: Every day | ORAL | 0 refills | Status: DC

## 2021-05-17 DIAGNOSIS — H16103 Unspecified superficial keratitis, bilateral: Secondary | ICD-10-CM | POA: Diagnosis not present

## 2021-05-17 DIAGNOSIS — H2000 Unspecified acute and subacute iridocyclitis: Secondary | ICD-10-CM | POA: Diagnosis not present

## 2021-05-17 DIAGNOSIS — H401123 Primary open-angle glaucoma, left eye, severe stage: Secondary | ICD-10-CM | POA: Diagnosis not present

## 2021-05-17 DIAGNOSIS — H1789 Other corneal scars and opacities: Secondary | ICD-10-CM | POA: Diagnosis not present

## 2021-05-26 ENCOUNTER — Telehealth: Payer: Self-pay | Admitting: Family Medicine

## 2021-05-26 DIAGNOSIS — J302 Other seasonal allergic rhinitis: Secondary | ICD-10-CM

## 2021-05-26 NOTE — Telephone Encounter (Signed)
Caller Name: Samantha Briggs   MEDICATION(S): Flonase    ~~~Please advise patient/caregiver to allow 2-3 business days to process RX refills.

## 2021-05-27 MED ORDER — FLUTICASONE PROPIONATE 50 MCG/ACT NA SUSP: NASAL | 4 refills | Status: DC

## 2021-05-27 NOTE — Addendum Note (Signed)
Addended by: Lake Bells on: 05/27/2021 08:26 AM   Modules accepted: Orders

## 2021-05-27 NOTE — Telephone Encounter (Signed)
Refill sent.

## 2021-05-28 ENCOUNTER — Ambulatory Visit (INDEPENDENT_AMBULATORY_CARE_PROVIDER_SITE_OTHER): Payer: Medicare Other | Admitting: Family Medicine

## 2021-05-28 ENCOUNTER — Encounter: Payer: Self-pay | Admitting: Family Medicine

## 2021-05-28 VITALS — BP 122/68 | HR 93 | Temp 97.5°F | Ht 66.0 in | Wt 183.2 lb

## 2021-05-28 DIAGNOSIS — J22 Unspecified acute lower respiratory infection: Secondary | ICD-10-CM | POA: Diagnosis not present

## 2021-05-28 DIAGNOSIS — R413 Other amnesia: Secondary | ICD-10-CM

## 2021-05-28 DIAGNOSIS — E538 Deficiency of other specified B group vitamins: Secondary | ICD-10-CM

## 2021-05-28 DIAGNOSIS — R4189 Other symptoms and signs involving cognitive functions and awareness: Secondary | ICD-10-CM | POA: Insufficient documentation

## 2021-05-28 MED ORDER — VITAMIN B-12 1000 MCG PO TABS: 1000.0000 ug | ORAL_TABLET | Freq: Every day | ORAL | 2 refills | Status: DC

## 2021-05-28 MED ORDER — AZITHROMYCIN 250 MG PO TABS
ORAL_TABLET | ORAL | 0 refills | Status: AC
Start: 2021-05-28 — End: 2021-06-02

## 2021-05-28 NOTE — Progress Notes (Signed)
Established Patient Office Visit  Subjective   Patient ID: Samantha Briggs, female    DOB: 08-25-1932  Age: 88 y.o. MRN: [id number]  Chief Complaint  Patient presents with   Cough    Cough, runny nose symptoms x 3 days. Patients daughter who's at visit have concerns about poor eating habits, confusion about medications and patient only wanting to be alone all the time.      Cough Pertinent negatives include no chest pain, chills, myalgias, sore throat, shortness of breath, weight loss or wheezing.  for evaluation of a 5-day history of apparent URI symptoms with nasal congestion, postnasal drip, like cough productive of purulent phlegm.  Denies wheezing or tightness in the chest fever or chills.  Patient is accompanied by her daughter Augusto Gamble who along with other family members are concerned about patient's memory.  Patient seems to spend a lot of time in her room and has not been joining the family as much.  She is responsible for her own medication schedule.  She does use a weekly pill organizer.  Hearing aids were recently adjusted.  She does not drive.    Review of Systems  Constitutional:  Negative for chills, diaphoresis, malaise/fatigue and weight loss.  HENT:  Positive for congestion and hearing loss. Negative for sinus pain and sore throat.   Eyes: Negative.  Negative for blurred vision and double vision.  Respiratory:  Positive for cough. Negative for shortness of breath and wheezing.   Cardiovascular:  Negative for chest pain.  Gastrointestinal:  Negative for abdominal pain.  Genitourinary: Negative.   Musculoskeletal:  Negative for falls and myalgias.  Neurological:  Negative for speech change, loss of consciousness and weakness.  Psychiatric/Behavioral: Negative.     Mini-Cog - 05/28/21 1140     Normal clock drawing test? yes    How many words correct? 3               Objective:     BP 122/68 (BP Location: Right Arm, Patient Position: Sitting, Cuff Size:  Normal)   Pulse 93   Temp (!) 97.5 F (36.4 C) (Temporal)   Ht 5\' 6"  (1.676 m)   Wt 183 lb 3.2 oz (83.1 kg)   SpO2 98%   BMI 29.57 kg/m  Wt Readings from Last 3 Encounters:  05/28/21 183 lb 3.2 oz (83.1 kg)  03/22/21 182 lb 9.6 oz (82.8 kg)  01/20/21 186 lb 3.2 oz (84.5 kg)      Physical Exam Constitutional:      General: She is not in acute distress.    Appearance: Normal appearance. She is not ill-appearing, toxic-appearing or diaphoretic.  HENT:     Head: Normocephalic and atraumatic.     Right Ear: Tympanic membrane, ear canal and external ear normal.     Left Ear: Tympanic membrane, ear canal and external ear normal.     Mouth/Throat:     Mouth: Mucous membranes are moist.     Pharynx: Oropharynx is clear. No oropharyngeal exudate or posterior oropharyngeal erythema.  Eyes:     General: No scleral icterus.       Right eye: No discharge.        Left eye: No discharge.     Extraocular Movements: Extraocular movements intact.     Conjunctiva/sclera: Conjunctivae normal.     Pupils: Pupils are equal, round, and reactive to light.  Cardiovascular:     Rate and Rhythm: Normal rate and regular rhythm.  Pulmonary:  Effort: Pulmonary effort is normal. No respiratory distress.     Breath sounds: Normal breath sounds. No wheezing or rales.  Abdominal:     General: Bowel sounds are normal.     Tenderness: There is no abdominal tenderness. There is no guarding.  Musculoskeletal:     Cervical back: No rigidity or tenderness.  Skin:    General: Skin is warm and dry.  Neurological:     Mental Status: She is alert and oriented to person, place, and time.  Psychiatric:        Mood and Affect: Mood normal.        Behavior: Behavior normal.     No results found for Samantha visits on 05/28/21.    The ASCVD Risk score (Arnett DK, et al., 2019) failed to calculate for the following reasons:   The 2019 ASCVD risk score is only valid for ages 28 to 65    Assessment & Plan:    Problem List Items Addressed This Visit       Respiratory   Lower respiratory infection - Primary   Relevant Medications   azithromycin (ZITHROMAX) 250 MG tablet   Other Relevant Orders   Novel Coronavirus, NAA ([facility])     Other   Memory change   Other Visit Diagnoses     B12 deficiency       Relevant Medications   vitamin B-12 (CYANOCOBALAMIN) 1000 MCG tablet       Return in about 1 week (around 06/04/2021), or if symptoms worsen or fail to improve.  Patient did well with the mini cog test.  She continues to agree, with work on the computer and pain.  Will return for reevaluation with further concerns of memory.  Mliss Sax, MD

## 2021-06-02 DIAGNOSIS — J22 Unspecified acute lower respiratory infection: Secondary | ICD-10-CM | POA: Diagnosis not present

## 2021-06-03 LAB — NOVEL CORONAVIRUS, NAA: SARS-CoV-2, NAA: NOT DETECTED

## 2021-06-03 LAB — SPECIMEN STATUS REPORT

## 2021-06-18 ENCOUNTER — Telehealth: Payer: Self-pay | Admitting: Family Medicine

## 2021-06-18 NOTE — Telephone Encounter (Signed)
Pt is stating her big toe is bothering her. I offered her an appointment, she declined. She is wanting a script for Allopurinol sent to   [facility] - [place], [place] - [address] [facility]  [address] [place], [place] [address]  Phone:  [phone]  Fax:  [phone]   Call back #[phone]

## 2021-06-21 ENCOUNTER — Ambulatory Visit (INDEPENDENT_AMBULATORY_CARE_PROVIDER_SITE_OTHER): Payer: Medicare Other | Admitting: Family Medicine

## 2021-06-21 ENCOUNTER — Encounter: Payer: Self-pay | Admitting: Family Medicine

## 2021-06-21 ENCOUNTER — Other Ambulatory Visit: Payer: Self-pay

## 2021-06-21 VITALS — BP 164/80 | HR 65 | Temp 97.4°F | Ht 66.0 in | Wt 185.0 lb

## 2021-06-21 DIAGNOSIS — E559 Vitamin D deficiency, unspecified: Secondary | ICD-10-CM | POA: Diagnosis not present

## 2021-06-21 DIAGNOSIS — Z8739 Personal history of other diseases of the musculoskeletal system and connective tissue: Secondary | ICD-10-CM

## 2021-06-21 DIAGNOSIS — M10071 Idiopathic gout, right ankle and foot: Secondary | ICD-10-CM

## 2021-06-21 DIAGNOSIS — E538 Deficiency of other specified B group vitamins: Secondary | ICD-10-CM | POA: Diagnosis not present

## 2021-06-21 DIAGNOSIS — D509 Iron deficiency anemia, unspecified: Secondary | ICD-10-CM

## 2021-06-21 DIAGNOSIS — I10 Essential (primary) hypertension: Secondary | ICD-10-CM

## 2021-06-21 DIAGNOSIS — E781 Pure hyperglyceridemia: Secondary | ICD-10-CM | POA: Diagnosis not present

## 2021-06-21 LAB — COMPREHENSIVE METABOLIC PANEL
ALT: 8 U/L (ref 0–35)
AST: 12 U/L (ref 0–37)
Albumin: 4.3 g/dL (ref 3.5–5.2)
Alkaline Phosphatase: 54 U/L (ref 39–117)
BUN: 24 mg/dL — ABNORMAL HIGH (ref 6–23)
CO2: 30 mEq/L (ref 19–32)
Calcium: 9.9 mg/dL (ref 8.4–10.5)
Chloride: 105 mEq/L (ref 96–112)
Creatinine, Ser: 1.1 mg/dL (ref 0.40–1.20)
GFR: 45.51 mL/min — ABNORMAL LOW (ref >60.00)
Glucose, Bld: 102 mg/dL — ABNORMAL HIGH (ref 70–99)
Potassium: 4.4 mEq/L (ref 3.5–5.1)
Sodium: 141 mEq/L (ref 135–145)
Total Bilirubin: 0.4 mg/dL (ref 0.2–1.2)
Total Protein: 6.8 g/dL (ref 6.0–8.3)

## 2021-06-21 LAB — CBC
HCT: 36.5 % (ref 36.0–46.0)
Hemoglobin: 11.8 g/dL — ABNORMAL LOW (ref 12.0–15.0)
MCHC: 32.4 g/dL (ref 30.0–36.0)
MCV: 83.8 fl (ref 78.0–100.0)
Platelets: 172 10*3/uL (ref 150.0–400.0)
RBC: 4.35 Mil/uL (ref 3.87–5.11)
RDW: 14.8 % (ref 11.5–15.5)
WBC: 4.5 10*3/uL (ref 4.0–10.5)

## 2021-06-21 LAB — LIPID PANEL
Cholesterol: 194 mg/dL (ref 0–200)
HDL: 42.3 mg/dL (ref >39.00)
LDL Cholesterol: 117 mg/dL — ABNORMAL HIGH (ref 0–99)
NonHDL: 151.35
Total CHOL/HDL Ratio: 5
Triglycerides: 171 mg/dL — ABNORMAL HIGH (ref 0.0–149.0)
VLDL: 34.2 mg/dL (ref 0.0–40.0)

## 2021-06-21 LAB — URIC ACID: Uric Acid, Serum: 8.1 mg/dL — ABNORMAL HIGH (ref 2.4–7.0)

## 2021-06-21 LAB — VITAMIN B12: Vitamin B-12: 491 pg/mL (ref 211–911)

## 2021-06-21 LAB — LDL CHOLESTEROL, DIRECT: Direct LDL: 135 mg/dL

## 2021-06-21 LAB — VITAMIN D 25 HYDROXY (VIT D DEFICIENCY, FRACTURES): VITD: 36.83 ng/mL (ref 30.00–100.00)

## 2021-06-21 MED ORDER — INDOMETHACIN 25 MG PO CAPS: 25.0000 mg | ORAL_CAPSULE | Freq: Three times a day (TID) | ORAL | 0 refills | Status: AC

## 2021-06-21 MED ORDER — FERROUS SULFATE 325 (65 FE) MG PO TABS: 325.0000 mg | ORAL_TABLET | Freq: Every day | ORAL | 6 refills | Status: DC

## 2021-06-21 MED ORDER — VITAMIN D (ERGOCALCIFEROL) 1.25 MG (50000 UNIT) PO CAPS: 50000.0000 [IU] | ORAL_CAPSULE | ORAL | 1 refills | Status: DC

## 2021-06-21 MED ORDER — HYDROCHLOROTHIAZIDE 25 MG PO TABS: 25.0000 mg | ORAL_TABLET | Freq: Every day | ORAL | 3 refills | Status: DC

## 2021-06-21 MED ORDER — COLCHICINE 0.6 MG PO TABS: 0.6000 mg | ORAL_TABLET | Freq: Two times a day (BID) | ORAL | 2 refills | Status: DC

## 2021-06-21 NOTE — Progress Notes (Signed)
Established Patient Office Visit  Subjective   Patient ID: Samantha Briggs, female    DOB: 1932-01-11  Age: 88 y.o. MRN: [id number]  Chief Complaint  Patient presents with   Pain    Gout flare up on right big toe x3 days. Pain in lower back x1 day. Pt is fasting    HPI for follow-up of hypertension, hypertriglyceridemia, B12 and vitamin D deficiency.  There is pain and erythema where the right great toe meets the foot.  There have been similar episodes in the past.  Perhaps 2 or 3 over the last 3 years.  Taking high-dose B12 daily.  She is not supplementing with vitamin daily.  She is compliant with her olmesartan and HCTZ.    Review of Systems  Constitutional:  Negative for chills, diaphoresis, malaise/fatigue and weight loss.  HENT: Negative.    Eyes: Negative.  Negative for blurred vision and double vision.  Cardiovascular:  Negative for chest pain.  Gastrointestinal:  Negative for abdominal pain.  Genitourinary: Negative.   Musculoskeletal:  Positive for joint pain. Negative for falls and myalgias.  Neurological:  Negative for speech change, loss of consciousness and weakness.  Psychiatric/Behavioral: Negative.        Objective:     BP (!) 162/80 (BP Location: Right Arm, Patient Position: Sitting, Cuff Size: Normal)   Pulse 65   Temp (!) 97.4 F (36.3 C) (Temporal)   Ht 5\' 6"  (1.676 m)   Wt 185 lb (83.9 kg)   SpO2 98%   BMI 29.86 kg/m    Physical Exam Constitutional:      General: She is not in acute distress.    Appearance: Normal appearance. She is not ill-appearing, toxic-appearing or diaphoretic.  HENT:     Head: Normocephalic and atraumatic.     Right Ear: External ear normal.     Left Ear: External ear normal.     Mouth/Throat:     Mouth: Mucous membranes are moist.     Pharynx: Oropharynx is clear. No oropharyngeal exudate or posterior oropharyngeal erythema.  Eyes:     General: No scleral icterus.       Right eye: No discharge.        Left  eye: No discharge.     Extraocular Movements: Extraocular movements intact.     Conjunctiva/sclera: Conjunctivae normal.     Pupils: Pupils are equal, round, and reactive to light.  Cardiovascular:     Rate and Rhythm: Normal rate and regular rhythm.  Pulmonary:     Effort: Pulmonary effort is normal. No respiratory distress.     Breath sounds: Normal breath sounds.  Abdominal:     General: Bowel sounds are normal.     Tenderness: There is no abdominal tenderness. There is no guarding.  Musculoskeletal:     Cervical back: No rigidity or tenderness.       Legs:  Skin:    General: Skin is warm and dry.  Neurological:     Mental Status: She is alert and oriented to person, place, and time.  Psychiatric:        Mood and Affect: Mood normal.        Behavior: Behavior normal.      No results found for any visits on 06/21/21.    The ASCVD Risk score (Arnett DK, et al., 2019) failed to calculate for the following reasons:   The 2019 ASCVD risk score is only valid for ages 50 to 18    Assessment &  Plan:   Problem List Items Addressed This Visit       Cardiovascular and Mediastinum   Essential hypertension - Primary   Relevant Medications   hydrochlorothiazide (HYDRODIURIL) 25 MG tablet   Other Relevant Orders   CBC   Comprehensive metabolic panel     Musculoskeletal and Integument   Acute idiopathic gout involving toe of right foot   Relevant Medications   colchicine 0.6 MG tablet   indomethacin (INDOCIN) 25 MG capsule   Other Relevant Orders   Uric acid     Other   Hypertriglyceridemia   Relevant Medications   hydrochlorothiazide (HYDRODIURIL) 25 MG tablet   Other Relevant Orders   LDL cholesterol, direct   Lipid panel   History of gout   Relevant Orders   Uric acid   Vitamin D deficiency   Relevant Medications   Vitamin D, Ergocalciferol, (DRISDOL) 1.25 MG (50000 UNIT) CAPS capsule   Other Relevant Orders   VITAMIN D 25 Hydroxy (Vit-D Deficiency,  Fractures)   B12 deficiency   Relevant Orders   Vitamin B12    Return in about 3 months (around 09/21/2021), or if symptoms worsen or fail to improve.  May consider allopurinol therapy.  Brief course of Indocin with colchicine.  We will start weekly high-dose vitamin D tablet.  Rechecking triglycerides and B12 levels.  Indocin should help lumbago.  Continue olmesartan.  Have increased HCTZ to 25 mg daily.  Will check and record blood pressures.  Information was given on gout.  Mliss Sax, MD

## 2021-06-21 NOTE — Telephone Encounter (Signed)
Patient has appointment scheduled.

## 2021-06-22 ENCOUNTER — Ambulatory Visit: Payer: Medicare Other | Admitting: Family Medicine

## 2021-07-12 ENCOUNTER — Other Ambulatory Visit: Payer: Self-pay | Admitting: Family Medicine

## 2021-07-12 DIAGNOSIS — E781 Pure hyperglyceridemia: Secondary | ICD-10-CM

## 2021-08-16 ENCOUNTER — Other Ambulatory Visit: Payer: Self-pay | Admitting: Family Medicine

## 2021-08-16 DIAGNOSIS — E038 Other specified hypothyroidism: Secondary | ICD-10-CM

## 2021-08-27 ENCOUNTER — Ambulatory Visit: Payer: Medicare Other

## 2021-08-30 NOTE — Telephone Encounter (Signed)
Na

## 2021-09-01 ENCOUNTER — Other Ambulatory Visit: Payer: Self-pay | Admitting: Family Medicine

## 2021-09-01 ENCOUNTER — Other Ambulatory Visit: Payer: Self-pay

## 2021-09-01 DIAGNOSIS — F418 Other specified anxiety disorders: Secondary | ICD-10-CM

## 2021-09-01 DIAGNOSIS — E781 Pure hyperglyceridemia: Secondary | ICD-10-CM

## 2021-09-01 MED ORDER — SERTRALINE HCL 100 MG PO TABS: 100.0000 mg | ORAL_TABLET | Freq: Every day | ORAL | 1 refills | Status: DC

## 2021-09-23 ENCOUNTER — Ambulatory Visit (INDEPENDENT_AMBULATORY_CARE_PROVIDER_SITE_OTHER): Payer: Medicare Other | Admitting: Family Medicine

## 2021-09-23 ENCOUNTER — Encounter: Payer: Self-pay | Admitting: Family Medicine

## 2021-09-23 VITALS — BP 136/68 | HR 94 | Temp 97.9°F | Ht 66.0 in | Wt 183.4 lb

## 2021-09-23 DIAGNOSIS — J302 Other seasonal allergic rhinitis: Secondary | ICD-10-CM | POA: Diagnosis not present

## 2021-09-23 DIAGNOSIS — Z23 Encounter for immunization: Secondary | ICD-10-CM

## 2021-09-23 DIAGNOSIS — S60511A Abrasion of right hand, initial encounter: Secondary | ICD-10-CM | POA: Diagnosis not present

## 2021-09-23 DIAGNOSIS — S66911A Strain of unspecified muscle, fascia and tendon at wrist and hand level, right hand, initial encounter: Secondary | ICD-10-CM

## 2021-09-23 MED ORDER — LEVOCETIRIZINE DIHYDROCHLORIDE 5 MG PO TABS: 5.0000 mg | ORAL_TABLET | Freq: Every evening | ORAL | 2 refills | Status: DC

## 2021-09-23 NOTE — Progress Notes (Signed)
Established Patient Office Visit  Subjective   Patient ID: Samantha Briggs, female    DOB: 27-Aug-1932  Age: 88 y.o. MRN: [id number]  Chief Complaint  Patient presents with   Cough    Cough and runny nose x 4 days. [place] today right wrist pain after fall.     Cough Pertinent negatives include no chills, eye redness, fever, headaches, myalgias, rash, sore throat, shortness of breath or wheezing.   4-day history of nasal congestion with postnasal drip and cough.  Ongoing history of allergic rhinitis.  Denies fevers chills sore throat difficulty breathing wheezing.  Compliant with Flonase and taking Zyrtec regularly.  Nasal congestion is her worst symptom.  While getting ready to come over here she was knocked to the ground by an exuberant husky.  She fell on outstretched right hand.    Review of Systems  Constitutional: Negative.  Negative for chills and fever.  HENT:  Positive for congestion. Negative for sinus pain and sore throat.   Eyes:  Negative for blurred vision, discharge and redness.  Respiratory:  Positive for cough. Negative for sputum production, shortness of breath and wheezing.   Cardiovascular: Negative.   Gastrointestinal:  Negative for abdominal pain.  Genitourinary: Negative.   Musculoskeletal:  Positive for joint pain. Negative for myalgias.  Skin:  Negative for rash.  Neurological:  Negative for tingling, loss of consciousness, weakness and headaches.  Endo/Heme/Allergies:  Negative for polydipsia.      Objective:     BP 136/68 (BP Location: Right Arm, Patient Position: Sitting, Cuff Size: Normal)   Pulse 94   Temp 97.9 F (36.6 C) (Temporal)   Ht 5\' 6"  (1.676 m)   Wt 183 lb 6.4 oz (83.2 kg)   SpO2 97%   BMI 29.60 kg/m    Physical Exam Constitutional:      General: She is not in acute distress.    Appearance: Normal appearance. She is not ill-appearing, toxic-appearing or diaphoretic.  HENT:     Head: Normocephalic and atraumatic.     Right  Ear: External ear normal.     Left Ear: External ear normal.     Mouth/Throat:     Mouth: Mucous membranes are moist.     Pharynx: Oropharynx is clear. No oropharyngeal exudate or posterior oropharyngeal erythema.  Eyes:     General: No scleral icterus.       Right eye: No discharge.        Left eye: No discharge.     Extraocular Movements: Extraocular movements intact.     Conjunctiva/sclera: Conjunctivae normal.     Pupils: Pupils are equal, round, and reactive to light.  Cardiovascular:     Rate and Rhythm: Normal rate and regular rhythm.  Pulmonary:     Effort: Pulmonary effort is normal. No respiratory distress.     Breath sounds: Normal breath sounds.  Abdominal:     General: Bowel sounds are normal.     Tenderness: There is no abdominal tenderness. There is no guarding.  Musculoskeletal:     Right wrist: Swelling, laceration, tenderness, bony tenderness and snuff box tenderness present. No deformity. Normal range of motion.       Arms:     Cervical back: No rigidity or tenderness.  Skin:    General: Skin is warm and dry.  Neurological:     Mental Status: She is alert and oriented to person, place, and time.  Psychiatric:        Mood and Affect: Mood  normal.        Behavior: Behavior normal.  Pends see about her Today with laceration   No results found for any visits on 09/23/21.    The ASCVD Risk score (Arnett DK, et al., 2019) failed to calculate for the following reasons:   The 2019 ASCVD risk score is only valid for ages 72 to 72    Assessment & Plan:   Problem List Items Addressed This Visit   None Visit Diagnoses     Strain of right wrist, initial encounter    -  Primary   Relevant Orders   DG Hand Complete Right   Seasonal allergic rhinitis, unspecified trigger       Relevant Medications   levocetirizine (XYZAL ALLERGY 24HR) 5 MG tablet   Flu vaccine need       Relevant Orders   Flu vaccine HIGH DOSE PF (Fluzone High dose) (Completed)   Need for  Tdap vaccination       Relevant Orders   Tdap vaccine greater than or equal to 7yo IM   Abrasion of right hand, initial encounter       Relevant Orders   Tdap vaccine greater than or equal to 7yo IM       Return in about 1 week (around 09/30/2021).  Continue Flonase.  May use nasal decongestant for 4 to 5 days only.  Switch to Xyzal.  X-ray of hand with navicular at [facility].  Ice packs for 15 minutes 3 times a day.  Tylenol as needed.  Elevation as possible.  Libby Maw, MD

## 2021-09-24 ENCOUNTER — Other Ambulatory Visit: Payer: Self-pay

## 2021-09-24 ENCOUNTER — Ambulatory Visit (INDEPENDENT_AMBULATORY_CARE_PROVIDER_SITE_OTHER): Payer: Medicare Other

## 2021-09-24 DIAGNOSIS — R059 Cough, unspecified: Secondary | ICD-10-CM

## 2021-09-24 DIAGNOSIS — S66911A Strain of unspecified muscle, fascia and tendon at wrist and hand level, right hand, initial encounter: Secondary | ICD-10-CM

## 2021-09-24 DIAGNOSIS — M79641 Pain in right hand: Secondary | ICD-10-CM | POA: Diagnosis not present

## 2021-09-27 ENCOUNTER — Encounter: Payer: Self-pay | Admitting: Family Medicine

## 2021-10-07 ENCOUNTER — Ambulatory Visit (INDEPENDENT_AMBULATORY_CARE_PROVIDER_SITE_OTHER): Payer: Medicare Other | Admitting: Family Medicine

## 2021-10-07 ENCOUNTER — Encounter: Payer: Self-pay | Admitting: Family Medicine

## 2021-10-07 VITALS — BP 132/68 | HR 95 | Temp 97.0°F | Ht 66.0 in | Wt 182.0 lb

## 2021-10-07 DIAGNOSIS — R0981 Nasal congestion: Secondary | ICD-10-CM

## 2021-10-07 DIAGNOSIS — J22 Unspecified acute lower respiratory infection: Secondary | ICD-10-CM

## 2021-10-07 DIAGNOSIS — J4520 Mild intermittent asthma, uncomplicated: Secondary | ICD-10-CM | POA: Diagnosis not present

## 2021-10-07 DIAGNOSIS — S63501D Unspecified sprain of right wrist, subsequent encounter: Secondary | ICD-10-CM | POA: Diagnosis not present

## 2021-10-07 DIAGNOSIS — S63501A Unspecified sprain of right wrist, initial encounter: Secondary | ICD-10-CM | POA: Insufficient documentation

## 2021-10-07 DIAGNOSIS — J45909 Unspecified asthma, uncomplicated: Secondary | ICD-10-CM | POA: Insufficient documentation

## 2021-10-07 MED ORDER — AMOXICILLIN-POT CLAVULANATE 875-125 MG PO TABS: 1.0000 | ORAL_TABLET | Freq: Two times a day (BID) | ORAL | 0 refills | Status: DC

## 2021-10-07 MED ORDER — PREDNISONE 20 MG PO TABS: 20.0000 mg | ORAL_TABLET | Freq: Two times a day (BID) | ORAL | 0 refills | Status: AC

## 2021-10-07 NOTE — Progress Notes (Signed)
Established Patient Office Visit  Subjective   Patient ID: Samantha Briggs, female    DOB: September 28, 1932  Age: 88 y.o. MRN: [id number]  Chief Complaint  Patient presents with   Wrist Injury    Still having right wrist pains, cough that will not go away, possible fungus on toes.     Wrist Injury  Pertinent negatives include no tingling.   follow-up of nasal congestion and cough.  Cough is now productive of light yellow phlegm.  There has been some tightness in the chest.  Denies frank wheezing or asthma history.  She quit smoking 50 years ago.  There is been no hemoptysis.  Nasal gesturing persists particularly in the right nare.  She has been compliant with her Flonase.  Right hand continues to bother her.  It has been stiff.    Review of Systems  Constitutional: Negative.   HENT: Negative.    Eyes:  Negative for blurred vision, discharge and redness.  Respiratory:  Positive for cough and sputum production. Negative for hemoptysis and shortness of breath.   Cardiovascular: Negative.   Gastrointestinal:  Negative for abdominal pain.  Genitourinary: Negative.   Musculoskeletal:  Positive for joint pain. Negative for myalgias.  Skin:  Negative for rash.  Neurological:  Negative for tingling, loss of consciousness and weakness.  Endo/Heme/Allergies:  Negative for polydipsia.      Objective:     BP 132/68 (BP Location: Right Arm, Patient Position: Sitting, Cuff Size: Normal)   Pulse 95   Temp (!) 97 F (36.1 C) (Temporal)   Ht 5\' 6"  (1.676 m)   Wt 182 lb (82.6 kg)   SpO2 99%   BMI 29.38 kg/m    Physical Exam Constitutional:      General: She is not in acute distress.    Appearance: Normal appearance. She is not ill-appearing, toxic-appearing or diaphoretic.  HENT:     Head: Normocephalic and atraumatic.     Right Ear: External ear normal.     Left Ear: External ear normal.     Nose: Congestion present. No rhinorrhea.     Mouth/Throat:     Mouth: Mucous membranes  are moist.     Pharynx: Oropharynx is clear. No oropharyngeal exudate or posterior oropharyngeal erythema.  Eyes:     General: No scleral icterus.       Right eye: No discharge.        Left eye: No discharge.     Extraocular Movements: Extraocular movements intact.     Conjunctiva/sclera: Conjunctivae normal.     Pupils: Pupils are equal, round, and reactive to light.  Cardiovascular:     Rate and Rhythm: Normal rate and regular rhythm.  Pulmonary:     Effort: Pulmonary effort is normal. No respiratory distress.     Breath sounds: Examination of the right-upper field reveals wheezing. Examination of the left-upper field reveals wheezing. Wheezing present.  Musculoskeletal:     Cervical back: No rigidity or tenderness.  Lymphadenopathy:     Cervical: No cervical adenopathy.  Skin:    General: Skin is warm and dry.  Neurological:     Mental Status: She is alert and oriented to person, place, and time.  Psychiatric:        Mood and Affect: Mood normal.        Behavior: Behavior normal.      No results found for any visits on 10/07/21.    The ASCVD Risk score (Arnett DK, et al., 2019) failed to  calculate for the following reasons:   The 2019 ASCVD risk score is only valid for ages 54 to 26    Assessment & Plan:   Problem List Items Addressed This Visit       Respiratory   Lower respiratory infection   Relevant Medications   amoxicillin-clavulanate (AUGMENTIN) 875-125 MG tablet   Reactive airway disease   Relevant Medications   predniSONE (DELTASONE) 20 MG tablet     Musculoskeletal and Integument   Sprain of right wrist - Primary   Relevant Orders   Ambulatory referral to Physical Therapy     Other   Nasal congestion   Relevant Medications   predniSONE (DELTASONE) 20 MG tablet    Return if symptoms worsen or fail to improve.  Continue Flonase.  Will use Augmentin for lower respiratory tract infection.  She did mention that amoxicillin has not worked well for  her in the past.  We will start prednisone 20 twice daily for chest tightness and chronic nasal congestion.  She will let me know if the nasal congestion does not improve.  Will consider ENT referral.  Agrees to go for physical therapy.  For ongoing discomfort in the right hand.  Libby Maw, MD

## 2021-10-11 ENCOUNTER — Other Ambulatory Visit: Payer: Self-pay | Admitting: Family Medicine

## 2021-10-11 DIAGNOSIS — E781 Pure hyperglyceridemia: Secondary | ICD-10-CM

## 2021-10-11 DIAGNOSIS — K2100 Gastro-esophageal reflux disease with esophagitis, without bleeding: Secondary | ICD-10-CM

## 2021-10-21 ENCOUNTER — Encounter: Payer: Self-pay | Admitting: Physical Therapy

## 2021-10-21 ENCOUNTER — Ambulatory Visit: Payer: Medicare Other | Attending: Family Medicine | Admitting: Physical Therapy

## 2021-10-21 DIAGNOSIS — S63501D Unspecified sprain of right wrist, subsequent encounter: Secondary | ICD-10-CM | POA: Diagnosis not present

## 2021-10-21 DIAGNOSIS — M25531 Pain in right wrist: Secondary | ICD-10-CM | POA: Diagnosis not present

## 2021-10-21 DIAGNOSIS — M6281 Muscle weakness (generalized): Secondary | ICD-10-CM | POA: Insufficient documentation

## 2021-10-21 DIAGNOSIS — R600 Localized edema: Secondary | ICD-10-CM | POA: Diagnosis not present

## 2021-10-21 NOTE — Therapy (Signed)
OUTPATIENT PHYSICAL THERAPY SHOULDER EVALUATION   Patient Name: Samantha Briggs MRN: [id number] DOB:May 20, 1932, 88 y.o., female Today's Date: 10/21/2021   PT End of Session - 10/21/21 1242     Visit Number 1    Date for PT Re-Evaluation 12/16/21    PT Start Time 1147    PT Stop Time 1228    PT Time Calculation (min) 41 min    Activity Tolerance Patient tolerated treatment well    Behavior During Therapy [facility] for tasks assessed/performed             Past Medical History:  Diagnosis Date   Allergy    Anemia    Depression    Glaucoma    Hyperlipidemia    Hypertension    Past Surgical History:  Procedure Laterality Date   broken foot Left    CHOLECYSTECTOMY     CORNEAL TRANSPLANT     x2   DILATION AND CURETTAGE OF UTERUS     RETINAL DETACHMENT SURGERY     Patient Active Problem List   Diagnosis Date Noted   Nasal congestion 10/07/2021   Reactive airway disease 10/07/2021   Sprain of right wrist 10/07/2021   History of gout 06/21/2021   Vitamin D deficiency 06/21/2021   B12 deficiency 06/21/2021   Acute idiopathic gout involving toe of right foot 06/21/2021   Memory change 05/28/2021   Lower respiratory infection 05/28/2021   Excessive cerumen in both ear canals 08/24/2020   Left foot pain 11/29/2019   Closed nondisplaced fracture of fifth left metatarsal bone 11/29/2019   Overweight (BMI 25.0-29.9) 06/28/2018   Hypothyroidism 04/21/2015   Sciatica of right side 12/02/2014   Anemia, iron deficiency 11/19/2013   IBS (irritable bowel syndrome) 06/17/2013   GERD (gastroesophageal reflux disease) 06/17/2013   Hypertriglyceridemia 03/13/2013   Routine general medical examination at a health care facility 09/19/2012   Plantar fasciitis 08/17/2012   Gout 08/17/2012   Post herpetic neuralgia 06/02/2011   Insomnia 05/18/2011   Essential hypertension 10/17/2010   Depression with anxiety 10/17/2010   Glaucoma 10/17/2010    PCP: Nadene Rubins  REFERRING  PROVIDER: Nadene Rubins  REFERRING DIAG: R wrist sprain  THERAPY DIAG:  Localized edema  Muscle weakness (generalized)  Pain in right wrist  Rationale for Evaluation and Treatment Rehabilitation  ONSET DATE: 09/23/21  SUBJECTIVE:                                                                                                                                                                                      SUBJECTIVE STATEMENT: Patient reports she was standing in her yard and got knocked to the ground by a  dog on 09/23/21. Fell on the grass onto her R hand and wrist. X rays were neg.  PERTINENT HISTORY: N/A  PAIN:  Are you having pain? Yes: NPRS scale: 5/10 Pain location: R wrist Pain description: sharp Aggravating factors: holding a book, turn wrist, computer. Relieving factors: not moving it.  PRECAUTIONS: None  WEIGHT BEARING RESTRICTIONS: No  FALLS:  Has patient fallen in last 6 months? Yes. Number of falls 1  LIVING ENVIRONMENT: Lives with: lives with their family Lives in: House/apartment Stairs: Yes: Internal: 14 steps; on right going up and External: 1 steps; none Has following equipment at home: None  OCCUPATION: retired  PLOF: Independent  PATIENT GOALS:Be able to move the wrist and use the arm without pain.  OBJECTIVE:   DIAGNOSTIC FINDINGS:  X rays neg  PATIENT SURVEYS:  FOTO 49.7  COGNITION: Overall cognitive status: Within functional limits for tasks assessed     SENSATION: Not tested  POSTURE: N/A  UPPER EXTREMITY ROM:   Passive ROM Right eval Left eval  Shoulder flexion    Shoulder extension    Shoulder abduction    Shoulder adduction    Shoulder internal rotation    Shoulder external rotation    Elbow flexion    Elbow extension    Wrist flexion WNL   Wrist extension WNL   Wrist ulnar deviation WNL   Wrist radial deviation WNL   Wrist pronation WNL   Wrist supination WNL   (Blank rows = not tested)  UPPER  EXTREMITY MMT: All R wrist strength WFL, but painful when attempting to use wrist functionally.  PALPATION:  Mildly TTP over base of R thumb   TODAY'S TREATMENT:                                                                                                                           DATE: Education Initiate HEP for AROM   PATIENT EDUCATION: Education details: HEP, use of soft splint to remind patient to not lift objects or perform painful activities. Improtance of AROM throughout the day-joints like to move, perform within tolerance Person educated: Patient and family friend Education method: Explanation, Demonstration, and Handouts Education comprehension: verbalized understanding and returned demonstration  HOME EXERCISE PROGRAM:  [id number]  ASSESSMENT:  CLINICAL IMPRESSION: Patient is a 88 y.o. who was seen today for physical therapy evaluation and treatment for R wrist sprain. She reports that her wrist hurts when she attempts to lift objects and when she turns the wrist while holding items. Her R thumb also appears to be sore when moving it. Established gentle AROM program for patient to perform multiple times daily at home. Also recommended soft wrist splint just to remind her not to use it functionally to lift heavy objects. Encouraged her to use heat or ice as well. Plan to see her in a week and update her HEP dependent upon how she tolerated the current extercises, possibly add gentle strengthening.   OBJECTIVE IMPAIRMENTS: decreased strength  and impaired UE functional use.   ACTIVITY LIMITATIONS: carrying, lifting, toileting, dressing, and hygiene/grooming  PARTICIPATION LIMITATIONS: cleaning  PERSONAL FACTORS: Age are also affecting patient's functional outcome.   REHAB POTENTIAL: Good  CLINICAL DECISION MAKING: Stable/uncomplicated  EVALUATION COMPLEXITY: Low   GOALS: Goals reviewed with patient? Yes  SHORT TERM GOALS: Target date: 11/04/2021  (Remove Blue  Hyperlink)  I with basic HEP Baseline: Goal status: INITIAL  LONG TERM GOALS: Target date: 12/16/2021  (Remove Blue Hyperlink)  I with final HEP Baseline:  Goal status: INITIAL  2.  Patient will report full ROM in wrist and hand with no pain Baseline: up to 5/10 Goal status: INITIAL  3.  Patient will be able to work on the computer, lift a book or other item of same weight, turn wrist over without pain Baseline: up to 5/10 Goal status: INITIAL  PLAN:  PT FREQUENCY: 1-2x/week  PT DURATION: 8 weeks  PLANNED INTERVENTIONS: Therapeutic exercises, Therapeutic activity, Neuromuscular re-education, Patient/Family education, Self Care, Joint mobilization, Cryotherapy, Moist heat, Ionotophoresis 4mg /ml Dexamethasone, and Manual therapy  PLAN FOR NEXT SESSION: Upate HEP with some strengthening as tolerated.   Marcelina Morel, DPT 10/21/2021, 12:56 PM

## 2021-10-27 ENCOUNTER — Encounter: Payer: Self-pay | Admitting: Physical Therapy

## 2021-10-27 ENCOUNTER — Ambulatory Visit: Payer: Medicare Other | Admitting: Physical Therapy

## 2021-10-27 DIAGNOSIS — R600 Localized edema: Secondary | ICD-10-CM

## 2021-10-27 DIAGNOSIS — M6281 Muscle weakness (generalized): Secondary | ICD-10-CM

## 2021-10-27 DIAGNOSIS — S63501D Unspecified sprain of right wrist, subsequent encounter: Secondary | ICD-10-CM | POA: Diagnosis not present

## 2021-10-27 DIAGNOSIS — M25531 Pain in right wrist: Secondary | ICD-10-CM

## 2021-10-27 NOTE — Therapy (Signed)
OUTPATIENT PHYSICAL THERAPY SHOULDER EVALUATION   Patient Name: Samantha Briggs MRN: [id number] DOB:1932/04/11, 88 y.o., female Today's Date: 10/27/2021   PT End of Session - 10/27/21 1706     Visit Number 2    Date for PT Re-Evaluation 12/16/21    PT Start Time 1630    PT Stop Time 1658    PT Time Calculation (min) 28 min    Activity Tolerance Patient tolerated treatment well    Behavior During Therapy [facility] for tasks assessed/performed              Past Medical History:  Diagnosis Date   Allergy    Anemia    Depression    Glaucoma    Hyperlipidemia    Hypertension    Past Surgical History:  Procedure Laterality Date   broken foot Left    CHOLECYSTECTOMY     CORNEAL TRANSPLANT     x2   DILATION AND CURETTAGE OF UTERUS     RETINAL DETACHMENT SURGERY     Patient Active Problem List   Diagnosis Date Noted   Nasal congestion 10/07/2021   Reactive airway disease 10/07/2021   Sprain of right wrist 10/07/2021   History of gout 06/21/2021   Vitamin D deficiency 06/21/2021   B12 deficiency 06/21/2021   Acute idiopathic gout involving toe of right foot 06/21/2021   Memory change 05/28/2021   Lower respiratory infection 05/28/2021   Excessive cerumen in both ear canals 08/24/2020   Left foot pain 11/29/2019   Closed nondisplaced fracture of fifth left metatarsal bone 11/29/2019   Overweight (BMI 25.0-29.9) 06/28/2018   Hypothyroidism 04/21/2015   Sciatica of right side 12/02/2014   Anemia, iron deficiency 11/19/2013   IBS (irritable bowel syndrome) 06/17/2013   GERD (gastroesophageal reflux disease) 06/17/2013   Hypertriglyceridemia 03/13/2013   Routine general medical examination at a health care facility 09/19/2012   Plantar fasciitis 08/17/2012   Gout 08/17/2012   Post herpetic neuralgia 06/02/2011   Insomnia 05/18/2011   Essential hypertension 10/17/2010   Depression with anxiety 10/17/2010   Glaucoma 10/17/2010    PCP: Abelino Derrick  REFERRING PROVIDER: Abelino Derrick  REFERRING DIAG: R wrist sprain  THERAPY DIAG:  Localized edema  Muscle weakness (generalized)  Pain in right wrist  Rationale for Evaluation and Treatment Rehabilitation  ONSET DATE: 09/23/21  SUBJECTIVE:                                                                                                                                                                                      SUBJECTIVE STATEMENT: Patient reports that her wrist is improving. She sometimes gets a twinge when using it.  When this happens, she uses her wrist brace for a while and it helps the pain. Overall much better. She has tried to rest the wrist and thumb.  PERTINENT HISTORY: N/A  PAIN:  Are you having pain? Yes: NPRS scale: 5/10 Pain location: R wrist Pain description: sharp Aggravating factors: holding a book, turn wrist, computer. Relieving factors: not moving it.  PRECAUTIONS: None  WEIGHT BEARING RESTRICTIONS: No  FALLS:  Has patient fallen in last 6 months? Yes. Number of falls 1  LIVING ENVIRONMENT: Lives with: lives with their family Lives in: House/apartment Stairs: Yes: Internal: 14 steps; on right going up and External: 1 steps; none Has following equipment at home: None  OCCUPATION: retired  PLOF: Independent  PATIENT GOALS:Be able to move the wrist and use the arm without pain.  OBJECTIVE:   DIAGNOSTIC FINDINGS:  X rays neg  PATIENT SURVEYS:  FOTO 49.7  COGNITION: Overall cognitive status: Within functional limits for tasks assessed     SENSATION: Not tested  POSTURE: N/A  UPPER EXTREMITY ROM:   Passive ROM Right eval Left eval  Shoulder flexion    Shoulder extension    Shoulder abduction    Shoulder adduction    Shoulder internal rotation    Shoulder external rotation    Elbow flexion    Elbow extension    Wrist flexion WNL   Wrist extension WNL   Wrist ulnar deviation WNL   Wrist radial deviation  WNL   Wrist pronation WNL   Wrist supination WNL   (Blank rows = not tested)  UPPER EXTREMITY MMT: All R wrist strength WFL, but painful when attempting to use wrist functionally.  PALPATION:  Mildly TTP over base of R thumb   TODAY'S TREATMENT:                                                                                                                           DATE: 10/27/21 Re-assessed pain in R wrist- appears to have localized to thumb  [place] with 2cm head x 5 minutes, .9 Educated to continue with AROM and add heat or ice as tolerated for pain  Education Initiate HEP for AROM   PATIENT EDUCATION: Education details: HEP, use of soft splint to remind patient to not lift objects or perform painful activities. Improtance of AROM throughout the day-joints like to move, perform within tolerance Person educated: Patient and family friend Education method: Explanation, Demonstration, and Handouts Education comprehension: verbalized understanding and returned demonstration  HOME EXERCISE PROGRAM:  [id number]  ASSESSMENT:  CLINICAL IMPRESSION: Patient arrived wearing her splint on R wrist, reports improved pain. It appears to have localized around the base of her thumb. Still occasional pain with active movement, so resistance exercises held for today and she will continue her AROM.  OBJECTIVE IMPAIRMENTS: decreased strength and impaired UE functional use.   ACTIVITY LIMITATIONS: carrying, lifting, toileting, dressing, and hygiene/grooming  PARTICIPATION LIMITATIONS: cleaning  PERSONAL FACTORS: Age are also affecting patient's functional  outcome.   REHAB POTENTIAL: Good  CLINICAL DECISION MAKING: Stable/uncomplicated  EVALUATION COMPLEXITY: Low   GOALS: Goals reviewed with patient? Yes  SHORT TERM GOALS: Target date: 11/04/2021  (Remove Blue Hyperlink)  I with basic HEP Baseline: Goal status: met  LONG TERM GOALS: Target date: 12/16/2021  (Remove Blue Hyperlink)  I  with final HEP Baseline:  Goal status: INITIAL  2.  Patient will report full ROM in wrist and hand with no pain Baseline: up to 5/10 Goal status: INITIAL  3.  Patient will be able to work on the computer, lift a book or other item of same weight, turn wrist over without pain Baseline: up to 5/10 Goal status: INITIAL  PLAN:  PT FREQUENCY: 1-2x/week  PT DURATION: 8 weeks  PLANNED INTERVENTIONS: Therapeutic exercises, Therapeutic activity, Neuromuscular re-education, Patient/Family education, Self Care, Joint mobilization, Cryotherapy, Moist heat, Ionotophoresis 33m/ml Dexamethasone, and Manual therapy  PLAN FOR NEXT SESSION: Upate HEP with some strengthening as tolerated.   SMarcelina Morel DPT 10/27/2021, 5:07 PM

## 2021-11-02 ENCOUNTER — Ambulatory Visit
Admission: RE | Admit: 2021-11-02 | Discharge: 2021-11-02 | Disposition: A | Discharge status: Home / Self Care | Payer: Medicare Other | Source: Ambulatory Visit | Attending: Family Medicine | Admitting: Family Medicine

## 2021-11-02 DIAGNOSIS — Z780 Asymptomatic menopausal state: Secondary | ICD-10-CM | POA: Diagnosis not present

## 2021-11-02 DIAGNOSIS — M810 Age-related osteoporosis without current pathological fracture: Secondary | ICD-10-CM | POA: Diagnosis not present

## 2021-11-04 ENCOUNTER — Ambulatory Visit: Payer: Medicare Other | Attending: Family Medicine | Admitting: Physical Therapy

## 2021-11-04 DIAGNOSIS — M6281 Muscle weakness (generalized): Secondary | ICD-10-CM | POA: Insufficient documentation

## 2021-11-04 DIAGNOSIS — M25531 Pain in right wrist: Secondary | ICD-10-CM | POA: Diagnosis not present

## 2021-11-04 DIAGNOSIS — R600 Localized edema: Secondary | ICD-10-CM | POA: Diagnosis not present

## 2021-11-04 NOTE — Therapy (Signed)
OUTPATIENT PHYSICAL THERAPY SHOULDER/wrist   Patient Name: Samantha Briggs MRN: [id number] DOB:Feb 17, 1932, 88 y.o., female Today's Date: 11/04/2021   PT End of Session - 11/04/21 1103     Visit Number 3    Date for PT Re-Evaluation 12/16/21    PT Start Time 1103    PT Stop Time 1145    PT Time Calculation (min) 42 min              Past Medical History:  Diagnosis Date   Allergy    Anemia    Depression    Glaucoma    Hyperlipidemia    Hypertension    Past Surgical History:  Procedure Laterality Date   broken foot Left    CHOLECYSTECTOMY     CORNEAL TRANSPLANT     x2   DILATION AND CURETTAGE OF UTERUS     RETINAL DETACHMENT SURGERY     Patient Active Problem List   Diagnosis Date Noted   Nasal congestion 10/07/2021   Reactive airway disease 10/07/2021   Sprain of right wrist 10/07/2021   History of gout 06/21/2021   Vitamin D deficiency 06/21/2021   B12 deficiency 06/21/2021   Acute idiopathic gout involving toe of right foot 06/21/2021   Memory change 05/28/2021   Lower respiratory infection 05/28/2021   Excessive cerumen in both ear canals 08/24/2020   Left foot pain 11/29/2019   Closed nondisplaced fracture of fifth left metatarsal bone 11/29/2019   Overweight (BMI 25.0-29.9) 06/28/2018   Hypothyroidism 04/21/2015   Sciatica of right side 12/02/2014   Anemia, iron deficiency 11/19/2013   IBS (irritable bowel syndrome) 06/17/2013   GERD (gastroesophageal reflux disease) 06/17/2013   Hypertriglyceridemia 03/13/2013   Routine general medical examination at a health care facility 09/19/2012   Plantar fasciitis 08/17/2012   Gout 08/17/2012   Post herpetic neuralgia 06/02/2011   Insomnia 05/18/2011   Essential hypertension 10/17/2010   Depression with anxiety 10/17/2010   Glaucoma 10/17/2010    PCP: Abelino Derrick  REFERRING PROVIDER: Abelino Derrick  REFERRING DIAG: R wrist sprain  THERAPY DIAG:  Localized edema  Muscle weakness  (generalized)  Pain in right wrist  Rationale for Evaluation and Treatment Rehabilitation  ONSET DATE: 09/23/21  SUBJECTIVE:                                                                                                                                                                                      SUBJECTIVE STATEMENT: Doing ex, still pain. NE with UE PERTINENT HISTORY: N/A  PAIN:  Are you having pain? Yes: NPRS scale: 5-6/10 Pain location: R wrist Pain description: sharp Aggravating factors: holding a book, turn wrist,  computer. Relieving factors: not moving it.  PRECAUTIONS: None  WEIGHT BEARING RESTRICTIONS: No  FALLS:  Has patient fallen in last 6 months? Yes. Number of falls 1  LIVING ENVIRONMENT: Lives with: lives with their family Lives in: House/apartment Stairs: Yes: Internal: 14 steps; on right going up and External: 1 steps; none Has following equipment at home: None  OCCUPATION: retired  PLOF: Independent  PATIENT GOALS:Be able to move the wrist and use the arm without pain.  OBJECTIVE:   DIAGNOSTIC FINDINGS:  X rays neg  PATIENT SURVEYS:  FOTO 49.7  COGNITION: Overall cognitive status: Within functional limits for tasks assessed     SENSATION: Not tested  POSTURE: N/A  UPPER EXTREMITY ROM:   Passive ROM Right eval Left eval  Shoulder flexion    Shoulder extension    Shoulder abduction    Shoulder adduction    Shoulder internal rotation    Shoulder external rotation    Elbow flexion    Elbow extension    Wrist flexion WNL   Wrist extension WNL   Wrist ulnar deviation WNL   Wrist radial deviation WNL   Wrist pronation WNL   Wrist supination WNL   (Blank rows = not tested)  UPPER EXTREMITY MMT: All R wrist strength WFL, but painful when attempting to use wrist functionally.  PALPATION:  Mildly TTP over base of R thumb   TODAY'S TREATMENT:                                                                                                                            DATE:  11/04/21  Red tband wrist flex,ext,UD,RD,sup and pronation 10 x each Wrist mobs and end range stretch. Thumb mobs Grip strength RT 30# left 38# Velcro board 2# wrist 4 way 10 each Supine/pronation 12 x each 3 # UBE 2 min fwd/2 min back L 3 Ionto rt medial wrist/thumb 1.2 cc dex 4 hour leave on patch      10/27/21 Re-assessed pain in R wrist- appears to have localized to thumb  [place] with 2cm head x 5 minutes, .9 Educated to continue with AROM and add heat or ice as tolerated for pain  Education Initiate HEP for AROM   PATIENT EDUCATION: Education details: HEP, use of soft splint to remind patient to not lift objects or perform painful activities. Improtance of AROM throughout the day-joints like to move, perform within tolerance Person educated: Patient and family friend Education method: Explanation, Demonstration, and Handouts Education comprehension: verbalized understanding and returned demonstration  HOME EXERCISE PROGRAM:  [id number]  ASSESSMENT:  CLINICAL IMPRESSION: Patient arrived wearing her splint on R wrist, continues to be  localized around the base of her thumb. Still occasional pain with active movement with increase wt ie full coffe cup or sup/pron with distal wt like hammer and velcro board OBJECTIVE IMPAIRMENTS: decreased strength and impaired UE functional use.   ACTIVITY LIMITATIONS: carrying, lifting, toileting, dressing, and hygiene/grooming  PARTICIPATION LIMITATIONS: cleaning  PERSONAL FACTORS: Age are also affecting patient's functional outcome.   REHAB POTENTIAL: Good  CLINICAL DECISION MAKING: Stable/uncomplicated  EVALUATION COMPLEXITY: Low   GOALS: Goals reviewed with patient? Yes  SHORT TERM GOALS: Target date: 11/04/2021  (Remove Blue Hyperlink)  I with basic HEP Baseline: Goal status: met  LONG TERM GOALS: Target date: 12/16/2021  (Remove Blue Hyperlink)  I with final  HEP Baseline:  Goal status: INITIAL  2.  Patient will report full ROM in wrist and hand with no pain Baseline: up to 5/10 Goal status: INITIAL  3.  Patient will be able to work on the computer, lift a book or other item of same weight, turn wrist over without pain Baseline: up to 5/10 Goal status: INITIAL  PLAN:  PT FREQUENCY: 1-2x/week  PT DURATION: 8 weeks  PLANNED INTERVENTIONS: Therapeutic exercises, Therapeutic activity, Neuromuscular re-education, Patient/Family education, Self Care, Joint mobilization, Cryotherapy, Moist heat, Ionotophoresis 2m/ml Dexamethasone, and Manual therapy  PLAN FOR NEXT SESSION: Upate HEP with some strengthening as tolerated.Assess ionto  AFranki MontePTA 11/04/2021, 11:03 AM [place] [address]Phone: [phone]  Fax:  [phone] Patient Details  Name: FLILLIE PORTNERMRN: [id number] of Birth: 130-Jul-1934Referring Provider:  KLibby Maw*  Encounter Date: 11/04/2021   PLaqueta Carina PTA 11/04/2021, 11:03 AM  [place] [address]Phone: [phone]  Fax:  [phone]

## 2021-11-05 ENCOUNTER — Other Ambulatory Visit: Payer: Self-pay | Admitting: Family Medicine

## 2021-11-05 DIAGNOSIS — E781 Pure hyperglyceridemia: Secondary | ICD-10-CM

## 2021-11-11 ENCOUNTER — Ambulatory Visit: Payer: Medicare Other | Admitting: Physical Therapy

## 2021-11-11 ENCOUNTER — Ambulatory Visit: Payer: Medicare Other | Admitting: Family Medicine

## 2021-11-11 DIAGNOSIS — R600 Localized edema: Secondary | ICD-10-CM | POA: Diagnosis not present

## 2021-11-11 DIAGNOSIS — M25531 Pain in right wrist: Secondary | ICD-10-CM | POA: Diagnosis not present

## 2021-11-11 DIAGNOSIS — M6281 Muscle weakness (generalized): Secondary | ICD-10-CM

## 2021-11-11 NOTE — Therapy (Signed)
OUTPATIENT PHYSICAL THERAPY SHOULDER/wrist   Patient Name: Samantha Briggs MRN: [id number] DOB:[birth year], 88 y.o., female Today's Date: 11/11/2021   PT End of Session - 11/11/21 1404     Visit Number 4    Date for PT Re-Evaluation 12/16/21    PT Start Time 1359    PT Stop Time 1440    PT Time Calculation (min) 41 min    Activity Tolerance Patient tolerated treatment well    Behavior During Therapy [facility] for tasks assessed/performed               Past Medical History:  Diagnosis Date   Allergy    Anemia    Depression    Glaucoma    Hyperlipidemia    Hypertension    Past Surgical History:  Procedure Laterality Date   broken foot Left    CHOLECYSTECTOMY     CORNEAL TRANSPLANT     x2   DILATION AND CURETTAGE OF UTERUS     RETINAL DETACHMENT SURGERY     Patient Active Problem List   Diagnosis Date Noted   Nasal congestion 10/07/2021   Reactive airway disease 10/07/2021   Sprain of right wrist 10/07/2021   History of gout 06/21/2021   Vitamin D deficiency 06/21/2021   B12 deficiency 06/21/2021   Acute idiopathic gout involving toe of right foot 06/21/2021   Memory change 05/28/2021   Lower respiratory infection 05/28/2021   Excessive cerumen in both ear canals 08/24/2020   Left foot pain 11/29/2019   Closed nondisplaced fracture of fifth left metatarsal bone 11/29/2019   Overweight (BMI 25.0-29.9) 06/28/2018   Hypothyroidism 04/21/2015   Sciatica of right side 12/02/2014   Anemia, iron deficiency 11/19/2013   IBS (irritable bowel syndrome) 06/17/2013   GERD (gastroesophageal reflux disease) 06/17/2013   Hypertriglyceridemia 03/13/2013   Routine general medical examination at a health care facility 09/19/2012   Plantar fasciitis 08/17/2012   Gout 08/17/2012   Post herpetic neuralgia 06/02/2011   Insomnia 05/18/2011   Essential hypertension 10/17/2010   Depression with anxiety 10/17/2010   Glaucoma 10/17/2010    PCP: Abelino Derrick  REFERRING  PROVIDER: Abelino Derrick  REFERRING DIAG: R wrist sprain  THERAPY DIAG:  Localized edema  Muscle weakness (generalized)  Pain in right wrist  Rationale for Evaluation and Treatment Rehabilitation  ONSET DATE: 09/23/21  SUBJECTIVE:                                                                                                                                                                                      SUBJECTIVE STATEMENT: Patient reports improving pain in her wrist, only feels it with certain movements.  PERTINENT  HISTORY: N/A  PAIN:  Are you having pain? Yes: NPRS scale: 5-6/10 Pain location: R wrist Pain description: sharp Aggravating factors: holding a book, turn wrist, computer. Relieving factors: not moving it.  PRECAUTIONS: None  WEIGHT BEARING RESTRICTIONS: No  FALLS:  Has patient fallen in last 6 months? Yes. Number of falls 1  LIVING ENVIRONMENT: Lives with: lives with their family Lives in: House/apartment Stairs: Yes: Internal: 14 steps; on right going up and External: 1 steps; none Has following equipment at home: None  OCCUPATION: retired  PLOF: Independent  PATIENT GOALS:Be able to move the wrist and use the arm without pain.  OBJECTIVE:   DIAGNOSTIC FINDINGS:  X rays neg  PATIENT SURVEYS:  FOTO 49.7  COGNITION: Overall cognitive status: Within functional limits for tasks assessed     SENSATION: Not tested  POSTURE: N/A  UPPER EXTREMITY ROM:   Passive ROM Right eval Left eval  Shoulder flexion    Shoulder extension    Shoulder abduction    Shoulder adduction    Shoulder internal rotation    Shoulder external rotation    Elbow flexion    Elbow extension    Wrist flexion WNL   Wrist extension WNL   Wrist ulnar deviation WNL   Wrist radial deviation WNL   Wrist pronation WNL   Wrist supination WNL   (Blank rows = not tested)  UPPER EXTREMITY MMT: All R wrist strength WFL, but painful when attempting to use  wrist functionally.  PALPATION:  Mildly TTP over base of R thumb   TODAY'S TREATMENT:                                                                                                                           DATE: 11/11/21 UBE L1 x 3 min forward/3 min back Seated wrist flex, ext radial deviation, pronation, supination with 1# resistance. Towel wringing in each direction Towel scrunches and open with hand 20 reps of each with RUE Ionto patch to R thumb  11/04/21 Red tband wrist flex,ext,UD,RD,sup and pronation 10 x each Wrist mobs and end range stretch. Thumb mobs Grip strength RT 30# left 38# Velcro board 2# wrist 4 way 10 each Supine/pronation 12 x each 3 # UBE 2 min fwd/2 min back L 3 Ionto rt medial wrist/thumb 1.2 cc dex 4 hour leave on patch  10/27/21 Re-assessed pain in R wrist- appears to have localized to thumb  [place] with 2cm head x 5 minutes, .9 Educated to continue with AROM and add heat or ice as tolerated for pain  Education Initiate HEP for AROM   PATIENT EDUCATION: Education details: HEP, use of soft splint to remind patient to not lift objects or perform painful activities. Improtance of AROM throughout the day-joints like to move, perform within tolerance Person educated: Patient and family friend Education method: Explanation, Demonstration, and Handouts Education comprehension: verbalized understanding and returned demonstration  HOME EXERCISE PROGRAM:  [id number]  ASSESSMENT:  CLINICAL IMPRESSION: Patient  continued improvement in her pain level. She performed strengthening for R wrist Samantha/B Ionto to base of thumb. HEP updated with strength exercises.  OBJECTIVE IMPAIRMENTS: decreased strength and impaired UE functional use.   ACTIVITY LIMITATIONS: carrying, lifting, toileting, dressing, and hygiene/grooming  PARTICIPATION LIMITATIONS: cleaning  PERSONAL FACTORS: Age are also affecting patient's functional outcome.   REHAB POTENTIAL: Good  CLINICAL  DECISION MAKING: Stable/uncomplicated  EVALUATION COMPLEXITY: Low   GOALS: Goals reviewed with patient? Yes  SHORT TERM GOALS: Target date: 11/04/2021  (Remove Blue Hyperlink)  I with basic HEP Baseline: Goal status: met  LONG TERM GOALS: Target date: 12/16/2021  (Remove Blue Hyperlink)  I with final HEP Baseline:  Goal status: ongoing  2.  Patient will report full ROM in wrist and hand with no pain Baseline: up to 5/10 Goal status: ongoing  3.  Patient will be able to work on the computer, lift a book or other item of same weight, turn wrist over without pain Baseline: up to 5/10 Goal status: INITIAL  PLAN:  PT FREQUENCY: 1-2x/week  PT DURATION: 8 weeks  PLANNED INTERVENTIONS: Therapeutic exercises, Therapeutic activity, Neuromuscular re-education, Patient/Family education, Self Care, Joint mobilization, Cryotherapy, Moist heat, Ionotophoresis 78m/ml Dexamethasone, and Manual therapy  PLAN FOR NEXT SESSION: Upate HEP with some strengthening as tolerated.Assess ionto  Patient Details  Name: FGOLDYE TOURANGEAUMRN: [id number] of Birth: [birth year]Referring Provider:  KLibby Maw*  Encounter Date: 11/11/2021  SMarcelina Morel DPT 11/11/2021, 2:29 PM  [place] [address]Phone: [phone]  Fax:  [phone]

## 2021-11-16 ENCOUNTER — Ambulatory Visit: Payer: Medicare Other | Admitting: Family Medicine

## 2021-11-22 ENCOUNTER — Ambulatory Visit: Payer: Medicare Other | Admitting: Family Medicine

## 2021-11-29 ENCOUNTER — Ambulatory Visit (INDEPENDENT_AMBULATORY_CARE_PROVIDER_SITE_OTHER): Payer: Medicare Other | Admitting: Family Medicine

## 2021-11-29 ENCOUNTER — Encounter: Payer: Self-pay | Admitting: Family Medicine

## 2021-11-29 VITALS — BP 136/68 | HR 91 | Temp 97.7°F | Ht 66.0 in | Wt 185.4 lb

## 2021-11-29 DIAGNOSIS — M810 Age-related osteoporosis without current pathological fracture: Secondary | ICD-10-CM | POA: Diagnosis not present

## 2021-11-29 DIAGNOSIS — I10 Essential (primary) hypertension: Secondary | ICD-10-CM | POA: Diagnosis not present

## 2021-11-29 MED ORDER — ALENDRONATE SODIUM 70 MG PO TABS: 70.0000 mg | ORAL_TABLET | ORAL | 4 refills | Status: DC

## 2021-11-29 NOTE — Progress Notes (Signed)
Established Patient Office Visit  Subjective   Patient ID: Samantha Briggs, female    DOB: 1932-06-17  Age: 88 y.o. MRN: [id number]  Chief Complaint  Patient presents with   Advice Only    Discuss bone density    HPI to discuss the results of her recent DEXA scan and treatment options.  T score was -2.5.  She understands that this is high normal for osteopenia or thinning of the bones and low baseline for osteoporosis.  This was her first DEXA scan.  He has never been treated for osteoporosis in the past.  Her dental health is excellent.  Overall health is excellent as well.  Blood pressure controlled with HCTZ 25 and olmesartan 40 mg daily.    Review of Systems  Constitutional: Negative.   HENT: Negative.    Eyes:  Negative for blurred vision, discharge and redness.  Respiratory: Negative.    Cardiovascular: Negative.   Gastrointestinal:  Negative for abdominal pain.  Genitourinary: Negative.   Musculoskeletal: Negative.  Negative for myalgias.  Skin:  Negative for rash.  Neurological:  Negative for tingling, loss of consciousness and weakness.  Endo/Heme/Allergies:  Negative for polydipsia.      Objective:     BP 136/68 (BP Location: Right Arm, Patient Position: Sitting, Cuff Size: Normal)   Pulse 91   Temp 97.7 F (36.5 C) (Temporal)   Ht 5\' 6"  (1.676 m)   Wt 185 lb 6.4 oz (84.1 kg)   SpO2 98%   BMI 29.92 kg/m  BP Readings from Last 3 Encounters:  11/29/21 136/68  10/07/21 132/68  09/23/21 136/68   Wt Readings from Last 3 Encounters:  11/29/21 185 lb 6.4 oz (84.1 kg)  10/07/21 182 lb (82.6 kg)  09/23/21 183 lb 6.4 oz (83.2 kg)      Physical Exam Constitutional:      General: She is not in acute distress.    Appearance: Normal appearance. She is not ill-appearing, toxic-appearing or diaphoretic.  HENT:     Head: Normocephalic and atraumatic.     Right Ear: External ear normal.     Left Ear: External ear normal.  Eyes:     General: No scleral  icterus.       Right eye: No discharge.        Left eye: No discharge.     Extraocular Movements: Extraocular movements intact.     Conjunctiva/sclera: Conjunctivae normal.  Pulmonary:     Effort: Pulmonary effort is normal. No respiratory distress.  Skin:    General: Skin is warm and dry.  Neurological:     Mental Status: She is alert and oriented to person, place, and time.  Psychiatric:        Mood and Affect: Mood normal.        Behavior: Behavior normal.      No results found for any visits on 11/29/21.    The ASCVD Risk score (Arnett DK, et al., 2019) failed to calculate for the following reasons:   The 2019 ASCVD risk score is only valid for ages 61 to 76    Assessment & Plan:   Problem List Items Addressed This Visit       Cardiovascular and Mediastinum   Essential hypertension   Other Visit Diagnoses     Age-related osteoporosis without current pathological fracture    -  Primary   Relevant Medications   alendronate (FOSAMAX) 70 MG tablet       Return in about 3 months (  around 03/01/2022).  We will start alendronate 70 mg weekly.  Discussed the importance of taking it on an empty stomach with a full glass of water.  She will let us know if she develops any nausea or stomach pain.  Discussed the risk of osteonecrosis of the jaw.  She will also consume 1200 mg of calcium daily and 800 international units of vitamin D daily.  Information was given on osteoporosis as well as Fosamax or alendronate.  Mliss Sax, MD

## 2021-12-08 ENCOUNTER — Other Ambulatory Visit: Payer: Self-pay | Admitting: Family Medicine

## 2021-12-08 DIAGNOSIS — E781 Pure hyperglyceridemia: Secondary | ICD-10-CM

## 2021-12-08 DIAGNOSIS — I10 Essential (primary) hypertension: Secondary | ICD-10-CM

## 2021-12-23 ENCOUNTER — Other Ambulatory Visit: Payer: Self-pay | Admitting: Family Medicine

## 2021-12-23 DIAGNOSIS — H401123 Primary open-angle glaucoma, left eye, severe stage: Secondary | ICD-10-CM | POA: Diagnosis not present

## 2021-12-23 DIAGNOSIS — E559 Vitamin D deficiency, unspecified: Secondary | ICD-10-CM

## 2022-01-08 ENCOUNTER — Other Ambulatory Visit: Payer: Self-pay | Admitting: Family Medicine

## 2022-01-08 DIAGNOSIS — K2100 Gastro-esophageal reflux disease with esophagitis, without bleeding: Secondary | ICD-10-CM

## 2022-01-08 DIAGNOSIS — E038 Other specified hypothyroidism: Secondary | ICD-10-CM

## 2022-01-25 ENCOUNTER — Telehealth: Payer: Self-pay

## 2022-01-25 DIAGNOSIS — R269 Unspecified abnormalities of gait and mobility: Secondary | ICD-10-CM

## 2022-01-25 NOTE — Telephone Encounter (Signed)
Patients daughter calling states that at her last visit in was mentioned that a referral would be placed for PT for patients balance. Okay for referral? Please advise.

## 2022-01-31 ENCOUNTER — Other Ambulatory Visit: Payer: Self-pay | Admitting: Family Medicine

## 2022-01-31 DIAGNOSIS — K2100 Gastro-esophageal reflux disease with esophagitis, without bleeding: Secondary | ICD-10-CM

## 2022-02-02 ENCOUNTER — Ambulatory Visit: Payer: Medicare Other

## 2022-02-09 ENCOUNTER — Other Ambulatory Visit: Payer: Self-pay

## 2022-02-09 ENCOUNTER — Ambulatory Visit: Payer: Medicare Other | Attending: Family Medicine

## 2022-02-09 DIAGNOSIS — Z9181 History of falling: Secondary | ICD-10-CM | POA: Diagnosis not present

## 2022-02-09 DIAGNOSIS — Z87898 Personal history of other specified conditions: Secondary | ICD-10-CM | POA: Insufficient documentation

## 2022-02-09 DIAGNOSIS — R269 Unspecified abnormalities of gait and mobility: Secondary | ICD-10-CM | POA: Insufficient documentation

## 2022-02-09 NOTE — Therapy (Addendum)
OUTPATIENT PHYSICAL THERAPY LOWER EXTREMITY EVALUATION   Patient Name: Samantha Briggs MRN: [id number] DOB:1932/08/07, 89 y.o., female Today's Date: 02/09/2022  END OF SESSION:  PT End of Session - 02/09/22 1353     Visit Number 1    PT Start Time 1350    PT Stop Time 1430    PT Time Calculation (min) 40 min    Equipment Utilized During Treatment Other (comment)    Activity Tolerance Patient tolerated treatment well    Behavior During Therapy WFL for tasks assessed/performed             Past Medical History:  Diagnosis Date   Allergy    Anemia    Depression    Glaucoma    Hyperlipidemia    Hypertension    Past Surgical History:  Procedure Laterality Date   broken foot Left    CHOLECYSTECTOMY     CORNEAL TRANSPLANT     x2   DILATION AND CURETTAGE OF UTERUS     RETINAL DETACHMENT SURGERY     Patient Active Problem List   Diagnosis Date Noted   Nasal congestion 10/07/2021   Reactive airway disease 10/07/2021   Sprain of right wrist 10/07/2021   History of gout 06/21/2021   Vitamin D deficiency 06/21/2021   B12 deficiency 06/21/2021   Acute idiopathic gout involving toe of right foot 06/21/2021   Memory change 05/28/2021   Lower respiratory infection 05/28/2021   Excessive cerumen in both ear canals 08/24/2020   Left foot pain 11/29/2019   Closed nondisplaced fracture of fifth left metatarsal bone 11/29/2019   Overweight (BMI 25.0-29.9) 06/28/2018   Hypothyroidism 04/21/2015   Sciatica of right side 12/02/2014   Anemia, iron deficiency 11/19/2013   IBS (irritable bowel syndrome) 06/17/2013   GERD (gastroesophageal reflux disease) 06/17/2013   Hypertriglyceridemia 03/13/2013   Routine general medical examination at a health care facility 09/19/2012   Plantar fasciitis 08/17/2012   Gout 08/17/2012   Post herpetic neuralgia 06/02/2011   Insomnia 05/18/2011   Essential hypertension 10/17/2010   Depression with anxiety 10/17/2010   Glaucoma 10/17/2010     PCP: Abelino Derrick, MD  REFERRING PROVIDER: Abelino Derrick, MD  REFERRING DIAG: unsteady gait  THERAPY DIAG:  History of unsteady gait  H/O fall  Rationale for Evaluation and Treatment: Rehabilitation  ONSET DATE: 01/25/22  SUBJECTIVE:   SUBJECTIVE STATEMENT: When I first stand up, I have to gather myself before I take off.  PERTINENT HISTORY: wrist injured when grandchild's dog knocked her down in October 2023, otherwise no falls.  Daughter with whom patient resides notes patient reaching, furniture walking sometimes in the house and was concerned about preventing future falls. PAIN:  Are you having pain? No  PRECAUTIONS: None  WEIGHT BEARING RESTRICTIONS: No  FALLS:  Has patient fallen in last 6 months? Yes. Number of falls 1  LIVING ENVIRONMENT: Lives with: lives with their family Lives in: House/apartment Stairs: Yes: Internal: 8 steps; on right going up Has following equipment at home: Single point cane  OCCUPATION: retired   PLOF: Independent with basic ADLs, Independent with household mobility without device, Independent with community mobility without device, and Needs assistance with homemaking  PATIENT GOALS: I want to make sure that I dont fall or decline further  NEXT MD VISIT: 03/01/22 PCP visit  OBJECTIVE:   DIAGNOSTIC FINDINGS: na    COGNITION: Overall cognitive status: Within functional limits for tasks assessed     SENSATION: WFL  EDEMA:  None noted  POSTURE: forward head  PALPATION: na  LOWER EXTREMITY ROM: All LE Rom grossly wnl  Active ROM Right eval Left eval  Hip flexion    Hip extension    Hip abduction    Hip adduction    Hip internal rotation    Hip external rotation    Knee flexion    Knee extension    Ankle dorsiflexion    Ankle plantarflexion    Ankle inversion    Ankle eversion     (Blank rows = not tested)  LOWER EXTREMITY MMT:All grossly wnl, patient able to heel and toe walk with light UE  support  MMT Right eval Left eval  Hip flexion    Hip extension    Hip abduction    Hip adduction    Hip internal rotation    Hip external rotation    Knee flexion    Knee extension    Ankle dorsiflexion    Ankle plantarflexion    Ankle inversion    Ankle eversion     (Blank rows = not tested)   FUNCTIONAL TESTS:  30 seconds chair stand test 15 reps FGA 28/30 6 min walk test:  walked 3 min 35 sec, x 600', then fatigued   GAIT: Distance walked: 600' Assistive device utilized: None Level of assistance: Modified independence Comments: narrowed base of support, loss of L hip extension  10 degrees L with mild shortened stance time on L   TODAY'S TREATMENT:                                                                                                                              DATE: 02/09/22:  Evaluation only, recommended patient and daughter walk to and from mailbox 3 x weekly to maintain her current level of fitness   [facility] PT Assessment - 02/09/22 0001       Functional Gait  Assessment   Gait Level Surface Walks 20 ft in less than 5.5 sec, no assistive devices, good speed, no evidence for imbalance, normal gait pattern, deviates no more than 6 in outside of the 12 in walkway width.    Change in Gait Speed Able to smoothly change walking speed without loss of balance or gait deviation. Deviate no more than 6 in outside of the 12 in walkway width.    Gait with Horizontal Head Turns Performs head turns smoothly with no change in gait. Deviates no more than 6 in outside 12 in walkway width    Gait with Vertical Head Turns Performs task with slight change in gait velocity (eg, minor disruption to smooth gait path), deviates 6 - 10 in outside 12 in walkway width or uses assistive device    Gait and Pivot Turn Pivot turns safely within 3 sec and stops quickly with no loss of balance.    Step Over Obstacle Is able to step over 2 stacked shoe boxes taped together (9 in total height)  without changing  gait speed. No evidence of imbalance.    Gait with Narrow Base of Support Is able to ambulate for 10 steps heel to toe with no staggering.    Gait with Eyes Closed Walks 20 ft, no assistive devices, good speed, no evidence of imbalance, normal gait pattern, deviates no more than 6 in outside 12 in walkway width. Ambulates 20 ft in less than 7 sec.    Ambulating Backwards Walks 20 ft, no assistive devices, good speed, no evidence for imbalance, normal gait    Steps Alternating feet, must use rail.    Total Score 28              PATIENT EDUCATION:  Education details: POC, evaluation tests and results Person educated: Patient and Child(ren) Education method: Explanation, Demonstration, and Verbal cues Education comprehension: verbalized understanding  HOME EXERCISE PROGRAM: na  ASSESSMENT:  CLINICAL IMPRESSION: Patient is a 89 y.o. female who was seen today for physical therapy evaluation and treatment for unsteady gait.  She was attended in treatment session by her daughter with whom she resides.  She reports some initial loss of balance with initial standing which is consistent, also more weaving when fatigued. She scored above the norm for 30 sec sit to stand test at 15(norm is 11), also 28/30 on the Functional gait assessment.  The main notable deficit today was fatigue with continuous walking at 3 min 35 sec. She does have a very thick lens on her L side of glasses, reports chronic visual changes and she and daughter report some difficulty with depth perception such as when negotiating steps and curbs.  Did recommend to patient and daughter to utilize hiking pole when on uneven or unfamiliar terrain due to her depth perception.  Determined after discussion with patient and daughter that today will be evaluation only, but if patient declines any or notices a change in her Sx will re evaluate if needed.  Patient is going to try to start walking to and from mailbox with  daughter to maintain her current level of fitness.    OBJECTIVE IMPAIRMENTS: decreased activity tolerance.   ACTIVITY LIMITATIONS: stairs  PARTICIPATION LIMITATIONS: driving  PERSONAL FACTORS: Age are also affecting patient's functional outcome.   REHAB POTENTIAL: Good  CLINICAL DECISION MAKING: Stable/uncomplicated  EVALUATION COMPLEXITY: Low    PLAN:  PT FREQUENCY:  one time visit for evaluation  PT DURATION: other: na  PLANNED INTERVENTIONS: na  PLAN FOR NEXT SESSION: na   Caedmon Louque Pamella Pert, PT 02/09/2022, 2:48 PM

## 2022-02-18 ENCOUNTER — Other Ambulatory Visit: Payer: Self-pay | Admitting: Family Medicine

## 2022-02-18 DIAGNOSIS — J302 Other seasonal allergic rhinitis: Secondary | ICD-10-CM

## 2022-03-01 ENCOUNTER — Ambulatory Visit: Payer: Medicare Other | Admitting: Family Medicine

## 2022-03-11 ENCOUNTER — Other Ambulatory Visit: Payer: Self-pay

## 2022-03-11 ENCOUNTER — Other Ambulatory Visit: Payer: Self-pay | Admitting: Family Medicine

## 2022-03-11 ENCOUNTER — Ambulatory Visit: Payer: Medicare Other | Admitting: Family Medicine

## 2022-03-11 DIAGNOSIS — F418 Other specified anxiety disorders: Secondary | ICD-10-CM

## 2022-03-11 MED ORDER — SERTRALINE HCL 100 MG PO TABS: 100.0000 mg | ORAL_TABLET | Freq: Every day | ORAL | 1 refills | Status: DC

## 2022-03-11 NOTE — Telephone Encounter (Signed)
Rx was refilled  for Sertraline HCL 100 MG Tablet.  [place]  P:[phone]

## 2022-03-21 ENCOUNTER — Encounter: Payer: Self-pay | Admitting: Family Medicine

## 2022-03-21 ENCOUNTER — Ambulatory Visit (INDEPENDENT_AMBULATORY_CARE_PROVIDER_SITE_OTHER): Payer: Medicare Other | Admitting: Family Medicine

## 2022-03-21 VITALS — BP 138/72 | HR 76 | Temp 98.3°F | Ht 66.0 in | Wt 183.6 lb

## 2022-03-21 DIAGNOSIS — E038 Other specified hypothyroidism: Secondary | ICD-10-CM | POA: Diagnosis not present

## 2022-03-21 DIAGNOSIS — I10 Essential (primary) hypertension: Secondary | ICD-10-CM | POA: Diagnosis not present

## 2022-03-21 DIAGNOSIS — E538 Deficiency of other specified B group vitamins: Secondary | ICD-10-CM | POA: Diagnosis not present

## 2022-03-21 DIAGNOSIS — E559 Vitamin D deficiency, unspecified: Secondary | ICD-10-CM

## 2022-03-21 NOTE — Progress Notes (Signed)
Established Patient Office Visit   Subjective:  Patient ID: Samantha Briggs, female    DOB: Sep 29, 1932  Age: 89 y.o. MRN: [id number]  Chief Complaint  Patient presents with   Medical Management of Chronic Issues    3 month follow up, no concerns. Patient not fasting.     HPI Encounter Diagnoses  Name Primary?   Vitamin D deficiency Yes   B12 deficiency    Essential hypertension    Other specified hypothyroidism    For follow-up of above.  Continues current supplementation.  Tolerating the Fosamax well.   Review of Systems  Constitutional: Negative.   HENT: Negative.    Eyes:  Negative for blurred vision, discharge and redness.  Respiratory: Negative.    Cardiovascular: Negative.   Gastrointestinal:  Negative for abdominal pain.  Genitourinary: Negative.   Musculoskeletal: Negative.  Negative for myalgias.  Skin:  Negative for rash.  Neurological:  Negative for tingling, loss of consciousness and weakness.  Endo/Heme/Allergies:  Negative for polydipsia.      03/21/2022    2:25 PM 11/29/2021    2:36 PM 10/07/2021    1:45 PM  Depression screen PHQ 2/9  Decreased Interest 0 0 0  Down, Depressed, Hopeless 0 0 0  PHQ - 2 Score 0 0 0       Current Outpatient Medications:    alendronate (FOSAMAX) 70 MG tablet, Take 1 tablet (70 mg total) by mouth every 7 (seven) days. Take with a full glass of water on an empty stomach., Disp: 12 tablet, Rfl: 4   dorzolamide (TRUSOPT) 2 % ophthalmic solution, , Disp: , Rfl: 11   fenofibrate 160 MG tablet, TAKE 1 TABLET (160 MG TOTAL) BY MOUTH DAILY. NEEDS APPOINTMENT, Disp: 90 tablet, Rfl: 1   ferrous sulfate 325 (65 FE) MG tablet, Take 1 tablet (325 mg total) by mouth daily with breakfast., Disp: 30 tablet, Rfl: 6   fluticasone (FLONASE) 50 MCG/ACT nasal spray, PLACE TWO SPRAYS INTO BOTH NOSTRILS DAILY, Disp: 16 mL, Rfl: 4   hydrochlorothiazide (HYDRODIURIL) 25 MG tablet, Take 1 tablet (25 mg total) by mouth daily., Disp: 90  tablet, Rfl: 3   latanoprost (XALATAN) 0.005 % ophthalmic solution, Place 1 drop into the left eye daily. , Disp: , Rfl:    levocetirizine (XYZAL) 5 MG tablet, TAKE ONE TABLET BY MOUTH DAILY IN THE EVENING, Disp: 30 tablet, Rfl: 5   levothyroxine (SYNTHROID) 88 MCG tablet, TAKE ONE TABLET BY MOUTH DAILY, Disp: 90 tablet, Rfl: 0   olmesartan (BENICAR) 40 MG tablet, Take 1 tablet (40 mg total) by mouth daily., Disp: 90 tablet, Rfl: 1   omeprazole (PRILOSEC) 20 MG capsule, TAKE ONE CAPSULE BY MOUTH DAILY, Disp: 90 capsule, Rfl: 0   PRED MILD 0.12 % ophthalmic suspension, Place 1 drop into both eyes. , Disp: , Rfl:    sertraline (ZOLOFT) 100 MG tablet, Take 1 tablet (100 mg total) by mouth daily., Disp: 90 tablet, Rfl: 1   vitamin B-12 (CYANOCOBALAMIN) 1000 MCG tablet, Take 1 tablet (1,000 mcg total) by mouth daily., Disp: 90 tablet, Rfl: 2   Vitamin D, Ergocalciferol, (DRISDOL) 1.25 MG (50000 UNIT) CAPS capsule, TAKE ONE CAPSULE BY MOUTH EVERY SEVEN DAYS, Disp: 15 capsule, Rfl: 0   Objective:     BP 138/72 (BP Location: Right Arm, Patient Position: Sitting, Cuff Size: Large)   Pulse 76   Temp 98.3 F (36.8 C) (Temporal)   Ht 5\' 6"  (1.676 m)   Wt 183 lb 9.6 oz (  83.3 kg)   SpO2 98%   BMI 29.63 kg/m    Physical Exam Constitutional:      General: She is not in acute distress.    Appearance: Normal appearance. She is not ill-appearing, toxic-appearing or diaphoretic.  HENT:     Head: Normocephalic and atraumatic.     Right Ear: External ear normal.     Left Ear: External ear normal.  Eyes:     General: No scleral icterus.       Right eye: No discharge.        Left eye: No discharge.     Extraocular Movements: Extraocular movements intact.     Conjunctiva/sclera: Conjunctivae normal.  Cardiovascular:     Rate and Rhythm: Normal rate and regular rhythm.  Pulmonary:     Effort: Pulmonary effort is normal. No respiratory distress.     Breath sounds: Normal breath sounds.  Skin:     General: Skin is warm and dry.     Findings: No bruising.  Neurological:     Mental Status: She is alert and oriented to person, place, and time.  Psychiatric:        Mood and Affect: Mood normal.        Behavior: Behavior normal.      No results found for any visits on 03/21/22.    The ASCVD Risk score (Arnett DK, et al., 2019) failed to calculate for the following reasons:   The 2019 ASCVD risk score is only valid for ages 69 to 37    Assessment & Plan:   Vitamin D deficiency -     VITAMIN D 25 Hydroxy (Vit-D Deficiency, Fractures)  B12 deficiency -     Vitamin B12  Essential hypertension -     Basic metabolic panel -     CBC  Other specified hypothyroidism -     TSH    Return in about 3 months (around 06/21/2022).    Samantha Maw, MD

## 2022-03-22 LAB — CBC
HCT: 37.6 % (ref 36.0–46.0)
Hemoglobin: 12.3 g/dL (ref 12.0–15.0)
MCHC: 32.8 g/dL (ref 30.0–36.0)
MCV: 86.7 fl (ref 78.0–100.0)
Platelets: 181 10*3/uL (ref 150.0–400.0)
RBC: 4.34 Mil/uL (ref 3.87–5.11)
RDW: 15.4 % (ref 11.5–15.5)
WBC: 5.1 10*3/uL (ref 4.0–10.5)

## 2022-03-22 LAB — BASIC METABOLIC PANEL
BUN: 28 mg/dL — ABNORMAL HIGH (ref 6–23)
CO2: 24 mEq/L (ref 19–32)
Calcium: 9.2 mg/dL (ref 8.4–10.5)
Chloride: 104 mEq/L (ref 96–112)
Creatinine, Ser: 1.18 mg/dL (ref 0.40–1.20)
GFR: 41.61 mL/min — ABNORMAL LOW (ref >60.00)
Glucose, Bld: 91 mg/dL (ref 70–99)
Potassium: 3.7 mEq/L (ref 3.5–5.1)
Sodium: 141 mEq/L (ref 135–145)

## 2022-03-22 LAB — VITAMIN B12: Vitamin B-12: >1500 pg/mL — ABNORMAL HIGH (ref 211–911)

## 2022-03-22 LAB — VITAMIN D 25 HYDROXY (VIT D DEFICIENCY, FRACTURES): VITD: 31.12 ng/mL (ref 30.00–100.00)

## 2022-03-22 LAB — TSH: TSH: 3.87 u[IU]/mL (ref 0.35–5.50)

## 2022-03-25 ENCOUNTER — Other Ambulatory Visit: Payer: Self-pay | Admitting: Family Medicine

## 2022-03-25 DIAGNOSIS — D509 Iron deficiency anemia, unspecified: Secondary | ICD-10-CM

## 2022-03-25 DIAGNOSIS — E538 Deficiency of other specified B group vitamins: Secondary | ICD-10-CM

## 2022-04-06 ENCOUNTER — Telehealth: Payer: Self-pay | Admitting: Family Medicine
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
## 2022-04-06 NOTE — Telephone Encounter (Signed)
Returned patients daughter call per Leveda Anna patient hit her leg on the coffee table 4 days ago, they have washed the area and applied neosporin but today it looks red and swollen little tender to touch, concerns that it may be infected. Appointment scheduled to have area checked.

## 2022-04-07 ENCOUNTER — Encounter: Payer: Self-pay | Admitting: Family Medicine

## 2022-04-07 ENCOUNTER — Ambulatory Visit (INDEPENDENT_AMBULATORY_CARE_PROVIDER_SITE_OTHER): Payer: Medicare Other | Admitting: Family Medicine

## 2022-04-07 VITALS — BP 130/72 | HR 76 | Temp 97.1°F | Ht 66.0 in | Wt 185.8 lb

## 2022-04-07 DIAGNOSIS — S81811A Laceration without foreign body, right lower leg, initial encounter: Secondary | ICD-10-CM

## 2022-04-07 NOTE — Progress Notes (Signed)
[facility] [address] [place] [address] Dept: [phone] Dept Fax: [phone]  Office Visit  Subjective:    Patient ID: Samantha Briggs, female    DOB: Jun 08, 1932, 89 y.o..   MRN: [id number]  Chief Complaint  Patient presents with   Leg Injury   History of Present Illness:  Patient is in today with a wound to her right lower leg. She notes she struck the lower leg against a coffee table. She had a tear in the skin. She has been applying Neosporin to this. She notes it had been painful, though not as much currently. She and her daughter had become concerned, as they were seeing some redness around the wound. She has not seen in drainage. Her daughter feels there is more swelling than usual aroudn the right ankle.  Past Medical History: Patient Active Problem List   Diagnosis Date Noted   Nasal congestion 10/07/2021   Reactive airway disease 10/07/2021   Sprain of right wrist 10/07/2021   History of gout 06/21/2021   Vitamin D deficiency 06/21/2021   B12 deficiency 06/21/2021   Acute idiopathic gout involving toe of right foot 06/21/2021   Memory change 05/28/2021   Lower respiratory infection 05/28/2021   Excessive cerumen in both ear canals 08/24/2020   Left foot pain 11/29/2019   Closed nondisplaced fracture of fifth left metatarsal bone 11/29/2019   Overweight (BMI 25.0-29.9) 06/28/2018   Hypothyroidism 04/21/2015   Sciatica of right side 12/02/2014   Anemia, iron deficiency 11/19/2013   IBS (irritable bowel syndrome) 06/17/2013   GERD (gastroesophageal reflux disease) 06/17/2013   Hypertriglyceridemia 03/13/2013   Routine general medical examination at a health care facility 09/19/2012   Plantar fasciitis 08/17/2012   Gout 08/17/2012   Post herpetic neuralgia 06/02/2011   Insomnia 05/18/2011   Essential hypertension 10/17/2010   Depression with anxiety 10/17/2010   Glaucoma 10/17/2010   Past  Surgical History:  Procedure Laterality Date   broken foot Left    CHOLECYSTECTOMY     CORNEAL TRANSPLANT     x2   DILATION AND CURETTAGE OF UTERUS     RETINAL DETACHMENT SURGERY     Family History  Problem Relation Age of Onset   Cancer Mother    Breast cancer Mother    Cancer Father    Pancreatic cancer Father    Diabetes Sister    Outpatient Medications Prior to Visit  Medication Sig Dispense Refill   alendronate (FOSAMAX) 70 MG tablet Take 1 tablet (70 mg total) by mouth every 7 (seven) days. Take with a full glass of water on an empty stomach. 12 tablet 4   cyanocobalamin (VITAMIN B12) 1000 MCG tablet TAKE 1 TABLET (1,000 MCG TOTAL) BY MOUTH DAILY. 90 tablet 1   dorzolamide (TRUSOPT) 2 % ophthalmic solution   11   fenofibrate 160 MG tablet TAKE 1 TABLET (160 MG TOTAL) BY MOUTH DAILY. NEEDS APPOINTMENT 90 tablet 1   ferrous sulfate 325 (65 FE) MG tablet TAKE ONE TABLET BY MOUTH DAILY WITH BREAKFAST 30 tablet 5   fluticasone (FLONASE) 50 MCG/ACT nasal spray PLACE TWO SPRAYS INTO BOTH NOSTRILS DAILY 16 mL 4   hydrochlorothiazide (HYDRODIURIL) 25 MG tablet Take 1 tablet (25 mg total) by mouth daily. 90 tablet 3   latanoprost (XALATAN) 0.005 % ophthalmic solution Place 1 drop into the left eye daily.      levocetirizine (XYZAL) 5 MG tablet TAKE ONE TABLET BY MOUTH DAILY IN THE EVENING 30  tablet 5   levothyroxine (SYNTHROID) 88 MCG tablet TAKE ONE TABLET BY MOUTH DAILY 90 tablet 0   olmesartan (BENICAR) 40 MG tablet Take 1 tablet (40 mg total) by mouth daily. 90 tablet 1   omeprazole (PRILOSEC) 20 MG capsule TAKE ONE CAPSULE BY MOUTH DAILY 90 capsule 0   PRED MILD 0.12 % ophthalmic suspension Place 1 drop into both eyes.      sertraline (ZOLOFT) 100 MG tablet Take 1 tablet (100 mg total) by mouth daily. 90 tablet 1   Vitamin D, Ergocalciferol, (DRISDOL) 1.25 MG (50000 UNIT) CAPS capsule TAKE ONE CAPSULE BY MOUTH EVERY SEVEN DAYS 15 capsule 0   No facility-administered medications  prior to visit.   Allergies  Allergen Reactions   Morphine And Related Hives   Neurontin [Gabapentin]     hives     Objective:   Today's Vitals   04/07/22 0820  BP: 130/72  Pulse: 76  Temp: (!) 97.1 F (36.2 C)  TempSrc: Temporal  SpO2: 97%  Weight: 185 lb 12.8 oz (84.3 kg)  Height: 5\' 6"  (1.676 m)   Body mass index is 29.99 kg/m.   General: Well developed, well nourished. No acute distress. Extremity: +/- trace edema of the right ankle. Skin: Warm and dry. There is a healing V-shaped wound tot he right shin. The skin is thin. The medial edge of the   wound is rolled up. There is an eschar across the wound and no sing of drainage. There is a mild rim of redness   around the wound and some mild bruising, but no sign of erythema or induration. Psych: Alert and oriented. Normal mood and affect.  Health Maintenance Due  Topic Date Due   Medicare Annual Wellness (AWV)  04/28/2022     Assessment & Plan:   Problem List Items Addressed This Visit       Musculoskeletal and Integument   Skin tear of lower leg without complication, right, initial encounter - Primary    We reviewed management of skin tears. In the future, she should try and spread the torn skin back over the wound and keep this covered. I see no sign of infection. Neosporin could delay skin healing. Recommend she clean this with saline, apply a small amount of Vaseline, and keep covered until healed.       Return if symptoms worsen or fail to improve.   Haydee Salter, MD

## 2022-04-07 NOTE — Assessment & Plan Note (Addendum)
We reviewed management of skin tears. In the future, she should try and spread the torn skin back over the wound and keep this covered. I see no sign of infection. Neosporin could delay skin healing. Recommend she clean this with saline, apply a small amount of Vaseline, and keep covered until healed.

## 2022-04-11 ENCOUNTER — Telehealth: Payer: Self-pay | Admitting: Family Medicine

## 2022-04-11 NOTE — Telephone Encounter (Signed)
Caller Name: Lennox Laity about mom Samantha Briggs Ph #: [phone] Chief Complaint: PT has an increase in her memory loss and has started with loss of her bowels and urine.   This call was transferred to Nurse Triage/Access Nurse. This is for documentation purposes. No follow up required at this time.

## 2022-04-11 NOTE — Telephone Encounter (Signed)
Returned call to check on patient no answer LM for daughter to call back patient scheduled to come on for office evaluation on 04/13/22. Please advise if this appointment is okay to wait?  Patient was home with nurse family member who states that  patient does not seem to be confused currently.

## 2022-04-11 NOTE — Telephone Encounter (Signed)
Caller Name: Jodie Call back phone #: [phone]  Reason for Call: Jodie returned you  call about her mom.
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
## 2022-04-12 ENCOUNTER — Other Ambulatory Visit: Payer: Self-pay | Admitting: Family Medicine

## 2022-04-12 DIAGNOSIS — E038 Other specified hypothyroidism: Secondary | ICD-10-CM

## 2022-04-12 NOTE — Telephone Encounter (Signed)
Spoke with patients daughter Lennox Laity who states that patient seems to be doing fine today. Family would like Dr. Doreene Burke to be aware that they are deciding on placing patient in assistant living to help care for patient states that patient has continued confusing and incontinence. Patient not aware as of now but they plan on talking to patient about this. They will keep appointment scheduled for 04/13/22 to also discuss with Provider.

## 2022-04-13 ENCOUNTER — Ambulatory Visit (INDEPENDENT_AMBULATORY_CARE_PROVIDER_SITE_OTHER): Payer: Medicare Other | Admitting: Family Medicine

## 2022-04-13 ENCOUNTER — Encounter: Payer: Self-pay | Admitting: Family Medicine

## 2022-04-13 VITALS — BP 144/76 | HR 71 | Temp 98.3°F | Ht 66.0 in | Wt 184.6 lb

## 2022-04-13 DIAGNOSIS — R4189 Other symptoms and signs involving cognitive functions and awareness: Secondary | ICD-10-CM | POA: Diagnosis not present

## 2022-04-13 NOTE — Progress Notes (Signed)
Established Patient Office Visit   Subjective:  Patient ID: Samantha Briggs, female    DOB: [birth year]  Age: 89 y.o. MRN: [id number]  Chief Complaint  Patient presents with   Altered Mental Status    Confusion issues, incontinence issues family would like to discuss patient going to assistant living facility. Recheck wound on right leg.      Altered Mental Status Pertinent negatives include no abdominal pain, myalgias, rash or weakness.   Encounter Diagnoses  Name Primary?   Cognitive decline Yes   Presents with daughter for discussion of ongoing cognitive decline.  More recently she left the faucet running and is seeing the cause to flooding damage to the floor.  She frequently becomes disoriented in the home.  She is no longer able to reliably manage her medication schedule.  Daughter does not trust her to the home on her own.  She has been asking the same questions twice.  Incontinence of urine and stool seems to be worsening.  Placement in assisted living has been discussed.  Patient does not like the decision but is agreeable to going.   Review of Systems  Constitutional: Negative.   HENT: Negative.    Eyes:  Negative for blurred vision, discharge and redness.  Respiratory: Negative.    Cardiovascular: Negative.   Gastrointestinal:  Negative for abdominal pain.  Genitourinary: Negative.   Musculoskeletal: Negative.  Negative for myalgias.  Skin:  Negative for rash.  Neurological:  Negative for tingling, loss of consciousness and weakness.  Endo/Heme/Allergies:  Negative for polydipsia.  Psychiatric/Behavioral:  Positive for memory loss.      Current Outpatient Medications:    alendronate (FOSAMAX) 70 MG tablet, Take 1 tablet (70 mg total) by mouth every 7 (seven) days. Take with a full glass of water on an empty stomach., Disp: 12 tablet, Rfl: 4   cyanocobalamin (VITAMIN B12) 1000 MCG tablet, TAKE 1 TABLET (1,000 MCG TOTAL) BY MOUTH DAILY., Disp: 90 tablet, Rfl: 1    dorzolamide (TRUSOPT) 2 % ophthalmic solution, , Disp: , Rfl: 11   fenofibrate 160 MG tablet, TAKE 1 TABLET (160 MG TOTAL) BY MOUTH DAILY. NEEDS APPOINTMENT, Disp: 90 tablet, Rfl: 1   ferrous sulfate 325 (65 FE) MG tablet, TAKE ONE TABLET BY MOUTH DAILY WITH BREAKFAST, Disp: 30 tablet, Rfl: 5   fluticasone (FLONASE) 50 MCG/ACT nasal spray, PLACE TWO SPRAYS INTO BOTH NOSTRILS DAILY, Disp: 16 mL, Rfl: 4   hydrochlorothiazide (HYDRODIURIL) 25 MG tablet, Take 1 tablet (25 mg total) by mouth daily., Disp: 90 tablet, Rfl: 3   latanoprost (XALATAN) 0.005 % ophthalmic solution, Place 1 drop into the left eye daily. , Disp: , Rfl:    levocetirizine (XYZAL) 5 MG tablet, TAKE ONE TABLET BY MOUTH DAILY IN THE EVENING, Disp: 30 tablet, Rfl: 5   levothyroxine (SYNTHROID) 88 MCG tablet, TAKE ONE TABLET BY MOUTH DAILY, Disp: 90 tablet, Rfl: 0   olmesartan (BENICAR) 40 MG tablet, Take 1 tablet (40 mg total) by mouth daily., Disp: 90 tablet, Rfl: 1   omeprazole (PRILOSEC) 20 MG capsule, TAKE ONE CAPSULE BY MOUTH DAILY, Disp: 90 capsule, Rfl: 0   PRED MILD 0.12 % ophthalmic suspension, Place 1 drop into both eyes. , Disp: , Rfl:    sertraline (ZOLOFT) 100 MG tablet, Take 1 tablet (100 mg total) by mouth daily., Disp: 90 tablet, Rfl: 1   Vitamin D, Ergocalciferol, (DRISDOL) 1.25 MG (50000 UNIT) CAPS capsule, TAKE ONE CAPSULE BY MOUTH EVERY SEVEN DAYS, Disp: 15 capsule,  Rfl: 0   Objective:     BP (!) 144/76 (BP Location: Right Arm, Patient Position: Sitting, Cuff Size: Normal)   Pulse 71   Temp 98.3 F (36.8 C) (Temporal)   Ht 5\' 6"  (1.676 m)   Wt 184 lb 9.6 oz (83.7 kg)   SpO2 98%   BMI 29.80 kg/m    Physical Exam Constitutional:      General: She is not in acute distress.    Appearance: Normal appearance. She is not ill-appearing, toxic-appearing or diaphoretic.  HENT:     Head: Normocephalic and atraumatic.     Right Ear: External ear normal.     Left Ear: External ear normal.  Eyes:     General:  No scleral icterus.       Right eye: No discharge.        Left eye: No discharge.     Extraocular Movements: Extraocular movements intact.     Conjunctiva/sclera: Conjunctivae normal.  Pulmonary:     Effort: Pulmonary effort is normal. No respiratory distress.  Skin:    General: Skin is warm and dry.  Neurological:     Mental Status: She is alert and oriented to person, place, and time.  Psychiatric:        Mood and Affect: Mood normal.        Behavior: Behavior normal.      No results found for any visits on 04/13/22.    The ASCVD Risk score (Arnett DK, et al., 2019) failed to calculate for the following reasons:   The 2019 ASCVD risk score is only valid for ages 69 to 75    Assessment & Plan:   Cognitive decline -     Ambulatory referral to Neurology    Return Follow-up in June as previously scheduled..  Daughter will continue to manage medications.  Recommended scheduled bathroom breaks.  Neurology referral for formal assessment.  Mliss Sax, MD

## 2022-04-19 ENCOUNTER — Other Ambulatory Visit: Payer: Self-pay | Admitting: Family Medicine

## 2022-04-19 DIAGNOSIS — E559 Vitamin D deficiency, unspecified: Secondary | ICD-10-CM

## 2022-04-19 DIAGNOSIS — K2100 Gastro-esophageal reflux disease with esophagitis, without bleeding: Secondary | ICD-10-CM

## 2022-04-22 ENCOUNTER — Telehealth: Payer: Self-pay | Admitting: Family Medicine

## 2022-04-22 NOTE — Telephone Encounter (Signed)
Contacted [name] to schedule their annual wellness visit. Call back at later date: 04/25/22  [name] AWV direct phone # [phone]

## 2022-04-28 ENCOUNTER — Telehealth: Payer: Self-pay | Admitting: Family Medicine

## 2022-04-28 NOTE — Telephone Encounter (Signed)
Called patient to schedule Medicare Annual Wellness Visit (AWV). Left message for patient to call back and schedule Medicare Annual Wellness Visit (AWV).  Last date of AWV: 04/27/21  Please schedule an appointment at any time with [facility].  If any questions, please contact me at [phone].  Thank you ,  [name] AWV direct phone # [phone]

## 2022-05-14 DIAGNOSIS — R051 Acute cough: Secondary | ICD-10-CM | POA: Diagnosis not present

## 2022-05-14 DIAGNOSIS — J069 Acute upper respiratory infection, unspecified: Secondary | ICD-10-CM | POA: Diagnosis not present

## 2022-05-14 DIAGNOSIS — R0981 Nasal congestion: Secondary | ICD-10-CM | POA: Diagnosis not present

## 2022-05-16 ENCOUNTER — Telehealth: Payer: Self-pay | Admitting: Family Medicine

## 2022-05-16 NOTE — Telephone Encounter (Signed)
Contacted Samantha Briggs to schedule their annual wellness visit. Appointment made for 05/20/22.  [name] AWV direct phone # [phone]

## 2022-05-19 ENCOUNTER — Encounter: Payer: Self-pay | Admitting: Family Medicine

## 2022-05-19 ENCOUNTER — Other Ambulatory Visit: Payer: Self-pay

## 2022-05-19 DIAGNOSIS — J302 Other seasonal allergic rhinitis: Secondary | ICD-10-CM

## 2022-05-19 DIAGNOSIS — E038 Other specified hypothyroidism: Secondary | ICD-10-CM

## 2022-05-19 DIAGNOSIS — E781 Pure hyperglyceridemia: Secondary | ICD-10-CM

## 2022-05-19 DIAGNOSIS — M810 Age-related osteoporosis without current pathological fracture: Secondary | ICD-10-CM

## 2022-05-19 DIAGNOSIS — F418 Other specified anxiety disorders: Secondary | ICD-10-CM

## 2022-05-19 DIAGNOSIS — K2100 Gastro-esophageal reflux disease with esophagitis, without bleeding: Secondary | ICD-10-CM

## 2022-05-19 DIAGNOSIS — I10 Essential (primary) hypertension: Secondary | ICD-10-CM

## 2022-05-19 MED ORDER — FENOFIBRATE 160 MG PO TABS
160.0000 mg | ORAL_TABLET | Freq: Every day | ORAL | 1 refills | Status: DC
Start: 2022-05-19 — End: 2022-07-27

## 2022-05-19 MED ORDER — LEVOCETIRIZINE DIHYDROCHLORIDE 5 MG PO TABS
5.0000 mg | ORAL_TABLET | Freq: Every evening | ORAL | 5 refills | Status: DC
Start: 2022-05-19 — End: 2022-08-15

## 2022-05-19 MED ORDER — OLMESARTAN MEDOXOMIL 40 MG PO TABS
40.0000 mg | ORAL_TABLET | Freq: Every day | ORAL | 1 refills | Status: DC
Start: 2022-05-19 — End: 2022-07-27

## 2022-05-19 MED ORDER — ALENDRONATE SODIUM 70 MG PO TABS
70.0000 mg | ORAL_TABLET | ORAL | 4 refills | Status: DC
Start: 2022-05-19 — End: 2023-05-09

## 2022-05-19 MED ORDER — HYDROCHLOROTHIAZIDE 25 MG PO TABS
25.0000 mg | ORAL_TABLET | Freq: Every day | ORAL | 3 refills | Status: DC
Start: 2022-05-19 — End: 2023-02-16

## 2022-05-19 MED ORDER — OMEPRAZOLE 20 MG PO CPDR
20.0000 mg | DELAYED_RELEASE_CAPSULE | Freq: Every day | ORAL | 0 refills | Status: DC
Start: 2022-05-19 — End: 2022-08-15

## 2022-05-19 MED ORDER — FLUTICASONE PROPIONATE 50 MCG/ACT NA SUSP
NASAL | 4 refills | Status: AC
Start: 2022-05-19 — End: ?

## 2022-05-19 MED ORDER — SERTRALINE HCL 100 MG PO TABS
100.0000 mg | ORAL_TABLET | Freq: Every day | ORAL | 1 refills | Status: DC
Start: 2022-05-19 — End: 2022-08-15

## 2022-05-19 MED ORDER — LEVOTHYROXINE SODIUM 88 MCG PO TABS
88.0000 ug | ORAL_TABLET | Freq: Every day | ORAL | 0 refills | Status: DC
Start: 2022-05-19 — End: 2022-08-15

## 2022-05-27 ENCOUNTER — Telehealth: Payer: Self-pay

## 2022-05-27 NOTE — Telephone Encounter (Signed)
This nurse attempted to call patient three times at number requested per notes. Message left that we would call again to schedule for another time.

## 2022-06-09 DIAGNOSIS — H401123 Primary open-angle glaucoma, left eye, severe stage: Secondary | ICD-10-CM | POA: Diagnosis not present

## 2022-06-21 ENCOUNTER — Ambulatory Visit (INDEPENDENT_AMBULATORY_CARE_PROVIDER_SITE_OTHER): Payer: Medicare Other | Admitting: Family Medicine

## 2022-06-21 ENCOUNTER — Encounter: Payer: Self-pay | Admitting: Family Medicine

## 2022-06-21 VITALS — BP 118/70 | HR 89 | Temp 98.0°F | Ht 66.0 in | Wt 180.0 lb

## 2022-06-21 DIAGNOSIS — R4189 Other symptoms and signs involving cognitive functions and awareness: Secondary | ICD-10-CM

## 2022-06-21 DIAGNOSIS — F418 Other specified anxiety disorders: Secondary | ICD-10-CM | POA: Diagnosis not present

## 2022-06-21 DIAGNOSIS — E781 Pure hyperglyceridemia: Secondary | ICD-10-CM

## 2022-06-21 DIAGNOSIS — I4949 Other premature depolarization: Secondary | ICD-10-CM | POA: Diagnosis not present

## 2022-06-21 DIAGNOSIS — Z8739 Personal history of other diseases of the musculoskeletal system and connective tissue: Secondary | ICD-10-CM | POA: Diagnosis not present

## 2022-06-21 DIAGNOSIS — I499 Cardiac arrhythmia, unspecified: Secondary | ICD-10-CM | POA: Insufficient documentation

## 2022-06-21 NOTE — Progress Notes (Signed)
Established Patient Office Visit   Subjective:  Patient ID: Samantha Briggs, female    DOB: 25-Nov-1932  Age: 89 y.o. MRN: [id number]  Chief Complaint  Patient presents with   Follow-up    Follow up Cognitive decline. No concerns. Neurology appointment is in July.     HPI Encounter Diagnoses  Name Primary?   Cognitive decline Yes   History of gout    Hypertriglyceridemia    Depression with anxiety    Extrasystoles    For follow-up of above.  Doing quite well since she moved to assisted living.  She has made lots of friends.  She is eating well.  She is sleeping well.  They walk for exercise.  She feels great.  Continue sertraline for depression.  Continues fenofibrate for triglycerides.  No further gouty attacks.   Review of Systems  Constitutional: Negative.   HENT: Negative.    Eyes:  Negative for blurred vision, discharge and redness.  Respiratory: Negative.    Cardiovascular: Negative.   Gastrointestinal:  Negative for abdominal pain.  Genitourinary: Negative.   Musculoskeletal: Negative.  Negative for myalgias.  Skin:  Negative for rash.  Neurological:  Negative for tingling, loss of consciousness and weakness.  Endo/Heme/Allergies:  Negative for polydipsia.     Current Outpatient Medications:    alendronate (FOSAMAX) 70 MG tablet, Take 1 tablet (70 mg total) by mouth every 7 (seven) days. Take with a full glass of water on an empty stomach., Disp: 12 tablet, Rfl: 4   cyanocobalamin (VITAMIN B12) 1000 MCG tablet, TAKE 1 TABLET (1,000 MCG TOTAL) BY MOUTH DAILY., Disp: 90 tablet, Rfl: 1   dorzolamide (TRUSOPT) 2 % ophthalmic solution, , Disp: , Rfl: 11   fenofibrate 160 MG tablet, Take 1 tablet (160 mg total) by mouth daily. Needs appointment, Disp: 90 tablet, Rfl: 1   ferrous sulfate 325 (65 FE) MG tablet, TAKE ONE TABLET BY MOUTH DAILY WITH BREAKFAST, Disp: 30 tablet, Rfl: 5   fluticasone (FLONASE) 50 MCG/ACT nasal spray, PLACE TWO SPRAYS INTO BOTH NOSTRILS  DAILY, Disp: 16 mL, Rfl: 4   hydrochlorothiazide (HYDRODIURIL) 25 MG tablet, Take 1 tablet (25 mg total) by mouth daily., Disp: 90 tablet, Rfl: 3   latanoprost (XALATAN) 0.005 % ophthalmic solution, Place 1 drop into the left eye daily. , Disp: , Rfl:    levocetirizine (XYZAL) 5 MG tablet, Take 1 tablet (5 mg total) by mouth every evening., Disp: 30 tablet, Rfl: 5   levothyroxine (SYNTHROID) 88 MCG tablet, Take 1 tablet (88 mcg total) by mouth daily., Disp: 90 tablet, Rfl: 0   olmesartan (BENICAR) 40 MG tablet, Take 1 tablet (40 mg total) by mouth daily., Disp: 90 tablet, Rfl: 1   omeprazole (PRILOSEC) 20 MG capsule, Take 1 capsule (20 mg total) by mouth daily., Disp: 90 capsule, Rfl: 0   PRED MILD 0.12 % ophthalmic suspension, Place 1 drop into both eyes. , Disp: , Rfl:    sertraline (ZOLOFT) 100 MG tablet, Take 1 tablet (100 mg total) by mouth daily., Disp: 90 tablet, Rfl: 1   Vitamin D, Ergocalciferol, (DRISDOL) 1.25 MG (50000 UNIT) CAPS capsule, TAKE ONE CAPSULE BY MOUTH EVERY SEVEN DAYS, Disp: 15 capsule, Rfl: 1   Objective:     BP 118/70   Pulse 89   Temp 98 F (36.7 C)   Ht 5\' 6"  (1.676 m)   Wt 180 lb (81.6 kg)   BMI 29.05 kg/m    Physical Exam Constitutional:  General: She is not in acute distress.    Appearance: Normal appearance. She is not ill-appearing, toxic-appearing or diaphoretic.  HENT:     Head: Normocephalic and atraumatic.     Right Ear: External ear normal.     Left Ear: External ear normal.  Eyes:     General: No scleral icterus.       Right eye: No discharge.        Left eye: No discharge.     Extraocular Movements: Extraocular movements intact.     Conjunctiva/sclera: Conjunctivae normal.  Cardiovascular:     Rate and Rhythm: Normal rate and regular rhythm. Occasional Extrasystoles are present. Pulmonary:     Effort: Pulmonary effort is normal. No respiratory distress.     Breath sounds: Normal breath sounds.  Musculoskeletal:     Cervical back:  No rigidity or tenderness.  Skin:    General: Skin is warm and dry.  Neurological:     Mental Status: She is alert and oriented to person, place, and time.  Psychiatric:        Mood and Affect: Mood normal.        Behavior: Behavior normal.      No results found for any visits on 06/21/22.    The ASCVD Risk score (Arnett DK, et al., 2019) failed to calculate for the following reasons:   The 2019 ASCVD risk score is only valid for ages 86 to 10    Assessment & Plan:   Cognitive decline  History of gout  Hypertriglyceridemia  Depression with anxiety  Extrasystoles -     EKG 12-Lead    Return in about 3 months (around 09/21/2022).  Follow-up EKG today obtained for cardiac exam with extrasystoles showed normal sinus rhythm without ST segment changes.  Continue all medications as above continue follow-up with neurology.  Mliss Sax, MD

## 2022-06-28 ENCOUNTER — Ambulatory Visit: Payer: Self-pay | Admitting: Neurology

## 2022-07-18 ENCOUNTER — Telehealth: Payer: Self-pay

## 2022-07-18 NOTE — Telephone Encounter (Signed)
Left patient a detailed voice message to return call to office for scheduling AWV.

## 2022-07-27 ENCOUNTER — Other Ambulatory Visit: Payer: Self-pay | Admitting: Family Medicine

## 2022-07-27 DIAGNOSIS — E781 Pure hyperglyceridemia: Secondary | ICD-10-CM

## 2022-07-27 DIAGNOSIS — I10 Essential (primary) hypertension: Secondary | ICD-10-CM

## 2022-08-09 ENCOUNTER — Ambulatory Visit: Payer: Self-pay | Admitting: Neurology

## 2022-08-12 ENCOUNTER — Other Ambulatory Visit: Payer: Self-pay

## 2022-08-12 ENCOUNTER — Encounter: Payer: Self-pay | Admitting: Family Medicine

## 2022-08-12 DIAGNOSIS — E559 Vitamin D deficiency, unspecified: Secondary | ICD-10-CM

## 2022-08-12 DIAGNOSIS — E538 Deficiency of other specified B group vitamins: Secondary | ICD-10-CM

## 2022-08-12 MED ORDER — VITAMIN B-12 1000 MCG PO TABS
1000.0000 ug | ORAL_TABLET | Freq: Every day | ORAL | 1 refills | Status: AC
Start: 2022-08-12 — End: ?

## 2022-08-12 MED ORDER — VITAMIN D (ERGOCALCIFEROL) 1.25 MG (50000 UNIT) PO CAPS
50000.0000 [IU] | ORAL_CAPSULE | ORAL | 1 refills | Status: AC
Start: 2022-08-12 — End: ?

## 2022-08-12 NOTE — Telephone Encounter (Signed)
Rx sent to the pharmacy.

## 2022-08-14 ENCOUNTER — Other Ambulatory Visit: Payer: Self-pay | Admitting: Family Medicine

## 2022-08-14 DIAGNOSIS — K2100 Gastro-esophageal reflux disease with esophagitis, without bleeding: Secondary | ICD-10-CM

## 2022-08-14 DIAGNOSIS — E038 Other specified hypothyroidism: Secondary | ICD-10-CM

## 2022-08-14 DIAGNOSIS — J302 Other seasonal allergic rhinitis: Secondary | ICD-10-CM

## 2022-08-14 DIAGNOSIS — F418 Other specified anxiety disorders: Secondary | ICD-10-CM

## 2022-08-15 ENCOUNTER — Telehealth: Payer: Self-pay | Admitting: Family Medicine

## 2022-08-15 NOTE — Telephone Encounter (Signed)
Pt is wondering if her mom should still be taking Iron, if so  [facility] - [place], [place] - [address] [address] [place], [place] [address] Phone: [phone]  Fax: [phone]    Docia Furl daughter's # [phone]

## 2022-08-16 ENCOUNTER — Other Ambulatory Visit: Payer: Self-pay

## 2022-08-16 DIAGNOSIS — D509 Iron deficiency anemia, unspecified: Secondary | ICD-10-CM

## 2022-08-16 MED ORDER — FERROUS SULFATE 325 (65 FE) MG PO TABS
325.0000 mg | ORAL_TABLET | Freq: Every day | ORAL | 5 refills | Status: AC
Start: 2022-08-16 — End: ?

## 2022-08-18 ENCOUNTER — Encounter (INDEPENDENT_AMBULATORY_CARE_PROVIDER_SITE_OTHER): Payer: Self-pay

## 2022-08-23 ENCOUNTER — Telehealth: Payer: Self-pay | Admitting: Family Medicine

## 2022-08-23 DIAGNOSIS — E038 Other specified hypothyroidism: Secondary | ICD-10-CM

## 2022-08-23 NOTE — Telephone Encounter (Signed)
[facility] [phone]  Reference # [id number]  Manufacturer is changing please resend the script  levothyroxine (SYNTHROID) 88 MCG tablet [[id number]]

## 2022-08-25 MED ORDER — LEVOTHYROXINE SODIUM 88 MCG PO TABS
88.0000 ug | ORAL_TABLET | Freq: Every day | ORAL | 3 refills | Status: DC
Start: 2022-08-25 — End: 2023-05-15

## 2022-08-26 NOTE — Telephone Encounter (Signed)
Per Dr. Doreene Burke new script has been sent to [facility]

## 2022-09-20 ENCOUNTER — Ambulatory Visit (INDEPENDENT_AMBULATORY_CARE_PROVIDER_SITE_OTHER): Payer: Medicare Other | Admitting: Family Medicine

## 2022-09-20 ENCOUNTER — Encounter: Payer: Self-pay | Admitting: Family Medicine

## 2022-09-20 VITALS — BP 134/82 | HR 103 | Temp 98.1°F | Ht 66.0 in | Wt 179.4 lb

## 2022-09-20 DIAGNOSIS — B349 Viral infection, unspecified: Secondary | ICD-10-CM | POA: Diagnosis not present

## 2022-09-20 DIAGNOSIS — Z1152 Encounter for screening for COVID-19: Secondary | ICD-10-CM | POA: Diagnosis not present

## 2022-09-20 LAB — POC COVID19 BINAXNOW: SARS Coronavirus 2 Ag: NEGATIVE

## 2022-09-20 MED ORDER — PROMETHAZINE-DM 6.25-15 MG/5ML PO SYRP
5.0000 mL | ORAL_SOLUTION | Freq: Three times a day (TID) | ORAL | 0 refills | Status: DC | PRN
Start: 2022-09-20 — End: 2023-01-19

## 2022-09-20 NOTE — Progress Notes (Unsigned)
Established Patient Office Visit   Subjective:  Patient ID: Samantha Briggs, female    DOB: 1932-10-06  Age: 89 y.o. MRN: [id number]  Chief Complaint  Patient presents with   Cough    NV, congestion, runny nose, cough and diarrhea x 2 days.     Cough Pertinent negatives include no eye redness, myalgias, rash, sore throat, shortness of breath or wheezing.   Encounter Diagnoses  Name Primary?   Encounter for screening for COVID-19 Yes   Viral syndrome    2-day history of malaise fatigue, rhinorrhea, dry cough, nausea and vomiting and watery diarrhea.  Lower extremities have been weak but denies myalgias or arthralgias.  No fevers or chills.  She is a resident in a nursing facility. {History (Optional):23778}  Review of Systems  Constitutional:  Positive for malaise/fatigue.  HENT:  Positive for congestion and sinus pain. Negative for sore throat.   Eyes:  Negative for blurred vision, discharge and redness.  Respiratory:  Positive for cough. Negative for sputum production, shortness of breath and wheezing.   Cardiovascular: Negative.   Gastrointestinal:  Positive for diarrhea, nausea and vomiting. Negative for abdominal pain.  Genitourinary: Negative.   Musculoskeletal: Negative.  Negative for joint pain and myalgias.  Skin:  Negative for rash.  Neurological:  Negative for tingling, loss of consciousness and weakness.  Endo/Heme/Allergies:  Negative for polydipsia.     Current Outpatient Medications:    alendronate (FOSAMAX) 70 MG tablet, Take 1 tablet (70 mg total) by mouth every 7 (seven) days. Take with a full glass of water on an empty stomach., Disp: 12 tablet, Rfl: 4   cyanocobalamin (VITAMIN B12) 1000 MCG tablet, Take 1 tablet (1,000 mcg total) by mouth daily. TAKE 1 TABLET (1,000 MCG TOTAL) BY MOUTH DAILY., Disp: 90 tablet, Rfl: 1   dorzolamide (TRUSOPT) 2 % ophthalmic solution, , Disp: , Rfl: 11   fenofibrate 160 MG tablet, TAKE 1 TABLET BY MOUTH DAILY, Disp: 100  tablet, Rfl: 2   ferrous sulfate 325 (65 FE) MG tablet, Take 1 tablet (325 mg total) by mouth daily with breakfast., Disp: 30 tablet, Rfl: 5   fluticasone (FLONASE) 50 MCG/ACT nasal spray, PLACE TWO SPRAYS INTO BOTH NOSTRILS DAILY, Disp: 16 mL, Rfl: 4   hydrochlorothiazide (HYDRODIURIL) 25 MG tablet, Take 1 tablet (25 mg total) by mouth daily., Disp: 90 tablet, Rfl: 3   latanoprost (XALATAN) 0.005 % ophthalmic solution, Place 1 drop into the left eye daily. , Disp: , Rfl:    levocetirizine (XYZAL) 5 MG tablet, TAKE 1 TABLET BY MOUTH IN THE  EVENING, Disp: 90 tablet, Rfl: 0   levothyroxine (SYNTHROID) 88 MCG tablet, Take 1 tablet (88 mcg total) by mouth daily., Disp: 90 tablet, Rfl: 3   olmesartan (BENICAR) 40 MG tablet, TAKE 1 TABLET BY MOUTH DAILY, Disp: 100 tablet, Rfl: 2   omeprazole (PRILOSEC) 20 MG capsule, TAKE 1 CAPSULE BY MOUTH DAILY, Disp: 90 capsule, Rfl: 3   PRED MILD 0.12 % ophthalmic suspension, Place 1 drop into both eyes. , Disp: , Rfl:    promethazine-dextromethorphan (PROMETHAZINE-DM) 6.25-15 MG/5ML syrup, Take 5 mLs by mouth 3 (three) times daily as needed for cough., Disp: 118 mL, Rfl: 0   sertraline (ZOLOFT) 100 MG tablet, TAKE 1 TABLET BY MOUTH DAILY, Disp: 90 tablet, Rfl: 3   Vitamin D, Ergocalciferol, (DRISDOL) 1.25 MG (50000 UNIT) CAPS capsule, Take 1 capsule (50,000 Units total) by mouth every 7 (seven) days. TAKE ONE CAPSULE BY MOUTH EVERY SEVEN DAYS,  Disp: 15 capsule, Rfl: 1   Objective:     BP 134/82   Pulse (!) 103   Temp 98.1 F (36.7 C)   Ht 5\' 6"  (1.676 m)   Wt 179 lb 6.4 oz (81.4 kg)   SpO2 98%   BMI 28.96 kg/m  {Vitals History (Optional):23777}  Physical Exam Constitutional:      General: She is not in acute distress.    Appearance: Normal appearance. She is not ill-appearing, toxic-appearing or diaphoretic.  HENT:     Head: Normocephalic and atraumatic.     Right Ear: Tympanic membrane, ear canal and external ear normal.     Left Ear: Tympanic  membrane, ear canal and external ear normal.  Eyes:     General: No scleral icterus.       Right eye: No discharge.        Left eye: No discharge.     Extraocular Movements: Extraocular movements intact.     Conjunctiva/sclera: Conjunctivae normal.     Pupils: Pupils are equal, round, and reactive to light.  Cardiovascular:     Rate and Rhythm: Normal rate and regular rhythm.  Pulmonary:     Effort: Pulmonary effort is normal. No respiratory distress.     Breath sounds: Normal breath sounds. No wheezing, rhonchi or rales.  Abdominal:     General: Bowel sounds are normal.  Musculoskeletal:     Cervical back: No rigidity or tenderness.  Lymphadenopathy:     Cervical: No cervical adenopathy.  Skin:    General: Skin is warm and dry.  Neurological:     Mental Status: She is alert and oriented to person, place, and time.  Psychiatric:        Mood and Affect: Mood normal.        Behavior: Behavior normal.      Results for orders placed or performed in visit on 09/20/22  POC COVID-19 BinaxNow  Result Value Ref Range   SARS Coronavirus 2 Ag Negative Negative    {Labs (Optional):23779}  The ASCVD Risk score (Arnett DK, et al., 2019) failed to calculate for the following reasons:   The 2019 ASCVD risk score is only valid for ages 56 to 61    Assessment & Plan:   Encounter for screening for COVID-19 -     POC COVID-19 BinaxNow  Viral syndrome -     COVID-19, Flu A+B and RSV -     Promethazine-DM; Take 5 mLs by mouth 3 (three) times daily as needed for cough.  Dispense: 118 mL; Refill: 0    Return in about 1 week (around 09/27/2022), or if symptoms worsen or fail to improve.    Mliss Sax, MD

## 2022-09-22 ENCOUNTER — Ambulatory Visit: Payer: Medicare Other | Admitting: Family Medicine

## 2022-09-22 ENCOUNTER — Telehealth: Payer: Self-pay | Admitting: Family Medicine

## 2022-09-22 LAB — COVID-19, FLU A+B AND RSV

## 2022-09-22 LAB — SPECIMEN STATUS REPORT

## 2022-09-22 NOTE — Telephone Encounter (Signed)
Pt will like for you to call her about lab results

## 2022-09-26 ENCOUNTER — Telehealth: Payer: Self-pay | Admitting: Family Medicine

## 2022-09-26 NOTE — Telephone Encounter (Signed)
Pt's daughter, Lennox Laity is needing a cb concerning her mom's most recent lab results. Please advise Lennox Laity at [phone]

## 2022-09-26 NOTE — Telephone Encounter (Signed)
Returning patients call regarding lab results.

## 2022-09-27 DIAGNOSIS — M25571 Pain in right ankle and joints of right foot: Secondary | ICD-10-CM | POA: Diagnosis not present

## 2022-09-28 NOTE — Progress Notes (Unsigned)
[facility] [address] [place] [address] Dept: [phone] Dept Fax: [phone]  Acute Care Office Visit  Subjective:   Samantha Briggs 1932-06-14 09/29/2022  No chief complaint on file.   HPI: Samantha Briggs is a 89 yo M with Pmhx of HTN, HLD, hypothyroidism, and gout, who complains of *** onset ***  Associated: ***   The following portions of the patient's history were reviewed and updated as appropriate: past medical history, past surgical history, family history, social history, allergies, medications, and problem list.   Patient Active Problem List   Diagnosis Date Noted   Irregular cardiac rhythm 06/21/2022   Extrasystoles 06/21/2022   Skin tear of lower leg without complication, right, initial encounter 04/07/2022   Nasal congestion 10/07/2021   Reactive airway disease 10/07/2021   Sprain of right wrist 10/07/2021   History of gout 06/21/2021   Vitamin D deficiency 06/21/2021   B12 deficiency 06/21/2021   Acute idiopathic gout involving toe of right foot 06/21/2021   Cognitive decline 05/28/2021   Lower respiratory infection 05/28/2021   Excessive cerumen in both ear canals 08/24/2020   Left foot pain 11/29/2019   Closed nondisplaced fracture of fifth left metatarsal bone 11/29/2019   Overweight (BMI 25.0-29.9) 06/28/2018   Hypothyroidism 04/21/2015   Sciatica of right side 12/02/2014   Anemia, iron deficiency 11/19/2013   IBS (irritable bowel syndrome) 06/17/2013   GERD (gastroesophageal reflux disease) 06/17/2013   Hypertriglyceridemia 03/13/2013   Routine general medical examination at a health care facility 09/19/2012   Plantar fasciitis 08/17/2012   Gout 08/17/2012   Post herpetic neuralgia 06/02/2011   Insomnia 05/18/2011   Essential hypertension 10/17/2010   Depression with anxiety 10/17/2010   Glaucoma 10/17/2010   Past Medical History:  Diagnosis Date   Allergy    Anemia     Depression    Glaucoma    Hyperlipidemia    Hypertension    Past Surgical History:  Procedure Laterality Date   broken foot Left    CHOLECYSTECTOMY     CORNEAL TRANSPLANT     x2   DILATION AND CURETTAGE OF UTERUS     RETINAL DETACHMENT SURGERY     Family History  Problem Relation Age of Onset   Cancer Mother    Breast cancer Mother    Cancer Father    Pancreatic cancer Father    Diabetes Sister     Current Outpatient Medications:    alendronate (FOSAMAX) 70 MG tablet, Take 1 tablet (70 mg total) by mouth every 7 (seven) days. Take with a full glass of water on an empty stomach., Disp: 12 tablet, Rfl: 4   cyanocobalamin (VITAMIN B12) 1000 MCG tablet, Take 1 tablet (1,000 mcg total) by mouth daily. TAKE 1 TABLET (1,000 MCG TOTAL) BY MOUTH DAILY., Disp: 90 tablet, Rfl: 1   dorzolamide (TRUSOPT) 2 % ophthalmic solution, , Disp: , Rfl: 11   fenofibrate 160 MG tablet, TAKE 1 TABLET BY MOUTH DAILY, Disp: 100 tablet, Rfl: 2   ferrous sulfate 325 (65 FE) MG tablet, Take 1 tablet (325 mg total) by mouth daily with breakfast., Disp: 30 tablet, Rfl: 5   fluticasone (FLONASE) 50 MCG/ACT nasal spray, PLACE TWO SPRAYS INTO BOTH NOSTRILS DAILY, Disp: 16 mL, Rfl: 4   hydrochlorothiazide (HYDRODIURIL) 25 MG tablet, Take 1 tablet (25 mg total) by mouth daily., Disp: 90 tablet, Rfl: 3   latanoprost (XALATAN) 0.005 % ophthalmic solution, Place 1 drop into the left eye  daily. , Disp: , Rfl:    levocetirizine (XYZAL) 5 MG tablet, TAKE 1 TABLET BY MOUTH IN THE  EVENING, Disp: 90 tablet, Rfl: 0   levothyroxine (SYNTHROID) 88 MCG tablet, Take 1 tablet (88 mcg total) by mouth daily., Disp: 90 tablet, Rfl: 3   olmesartan (BENICAR) 40 MG tablet, TAKE 1 TABLET BY MOUTH DAILY, Disp: 100 tablet, Rfl: 2   omeprazole (PRILOSEC) 20 MG capsule, TAKE 1 CAPSULE BY MOUTH DAILY, Disp: 90 capsule, Rfl: 3   PRED MILD 0.12 % ophthalmic suspension, Place 1 drop into both eyes. , Disp: , Rfl:     promethazine-dextromethorphan (PROMETHAZINE-DM) 6.25-15 MG/5ML syrup, Take 5 mLs by mouth 3 (three) times daily as needed for cough., Disp: 118 mL, Rfl: 0   sertraline (ZOLOFT) 100 MG tablet, TAKE 1 TABLET BY MOUTH DAILY, Disp: 90 tablet, Rfl: 3   Vitamin D, Ergocalciferol, (DRISDOL) 1.25 MG (50000 UNIT) CAPS capsule, Take 1 capsule (50,000 Units total) by mouth every 7 (seven) days. TAKE ONE CAPSULE BY MOUTH EVERY SEVEN DAYS, Disp: 15 capsule, Rfl: 1 Allergies  Allergen Reactions   Morphine And Codeine Hives   Neurontin [Gabapentin]     hives     ROS: A complete ROS was performed with pertinent positives/negatives noted in the HPI. The remainder of the ROS are negative.    Objective:   There were no vitals filed for this visit.  GENERAL: Well-appearing, in NAD. Well nourished.  SKIN: Pink, warm and dry. No rash, lesion, ulceration, or ecchymoses.  NECK: Trachea midline. Full ROM w/o pain or tenderness. No lymphadenopathy.  RESPIRATORY: Chest wall symmetrical. Respirations even and non-labored. Breath sounds clear to auscultation bilaterally.  CARDIAC: S1, S2 present, regular rate and rhythm. Peripheral pulses 2+ bilaterally.  MSK: Muscle tone and strength appropriate for age. Joints w/o tenderness, redness, or swelling. EXTREMITIES: Without clubbing, cyanosis, or edema.  NEUROLOGIC: No motor or sensory deficits. Steady, even gait.  PSYCH/MENTAL STATUS: Alert, oriented x 3. Cooperative, appropriate mood and affect.    No results found for any visits on 09/29/22.    Assessment & Plan:   There are no diagnoses linked to this encounter. No orders of the defined types were placed in this encounter.  No orders of the defined types were placed in this encounter.  Lab Orders  No laboratory test(s) ordered today   No images are attached to the encounter or orders placed in the encounter.  No follow-ups on file.   Salvatore Decent, FNP

## 2022-09-29 ENCOUNTER — Encounter: Payer: Self-pay | Admitting: Internal Medicine

## 2022-09-29 ENCOUNTER — Telehealth: Payer: Self-pay

## 2022-09-29 ENCOUNTER — Ambulatory Visit: Payer: Medicare Other | Admitting: Internal Medicine

## 2022-09-29 VITALS — BP 104/62 | HR 67 | Temp 98.1°F

## 2022-09-29 DIAGNOSIS — R2241 Localized swelling, mass and lump, right lower limb: Secondary | ICD-10-CM | POA: Diagnosis not present

## 2022-09-29 DIAGNOSIS — M10071 Idiopathic gout, right ankle and foot: Secondary | ICD-10-CM

## 2022-09-29 LAB — D-DIMER, QUANTITATIVE: D-Dimer, Quant: 0.62 mcg/mL FEU — ABNORMAL HIGH (ref <0.50)

## 2022-09-29 LAB — URIC ACID: Uric Acid, Serum: 10.3 mg/dL — ABNORMAL HIGH (ref 2.4–7.0)

## 2022-09-29 MED ORDER — COLCHICINE 0.6 MG PO TABS: ORAL_TABLET | ORAL | 0 refills | Status: DC

## 2022-09-29 NOTE — Telephone Encounter (Signed)
Pt had OV with PCP on 09/29/22.

## 2022-09-29 NOTE — Telephone Encounter (Signed)
Called [facility] to notify of STAT pick up for D-Dimer, test code [id number]. Confirmation code: [id number]

## 2022-09-29 NOTE — Telephone Encounter (Signed)
Called [facility] for STAT pick up for Uric acid. They stated are on the way.

## 2022-10-03 ENCOUNTER — Telehealth: Payer: Self-pay

## 2022-10-03 ENCOUNTER — Ambulatory Visit (HOSPITAL_COMMUNITY)
Admission: RE | Admit: 2022-10-03 | Discharge: 2022-10-03 | Disposition: A | Discharge status: Home / Self Care | Payer: Medicare Other | Source: Ambulatory Visit | Attending: Internal Medicine | Admitting: Internal Medicine

## 2022-10-03 DIAGNOSIS — R2241 Localized swelling, mass and lump, right lower limb: Secondary | ICD-10-CM | POA: Insufficient documentation

## 2022-10-03 DIAGNOSIS — M10071 Idiopathic gout, right ankle and foot: Secondary | ICD-10-CM

## 2022-10-03 MED ORDER — INDOMETHACIN 50 MG PO CAPS
50.0000 mg | ORAL_CAPSULE | Freq: Three times a day (TID) | ORAL | 0 refills | Status: AC
Start: 2022-10-03 — End: 2022-10-08

## 2022-10-03 NOTE — Telephone Encounter (Signed)
Results have been relayed to the patient. The patient verbalized understanding. No questions at this time.   

## 2022-10-03 NOTE — Telephone Encounter (Signed)
Called to see if any availability for pt to come in and have [place] completed. [place] with scheduling did not answer. Waiting on a cb.

## 2022-10-03 NOTE — Progress Notes (Signed)
VASCULAR LAB    Right lower extremity venous duplex has been performed.  See CV proc for preliminary results.   Natsumi Whitsitt, RVT 10/03/2022, 1:07 PM

## 2022-10-03 NOTE — Telephone Encounter (Addendum)
Next available [place] for wheelchair accessibility is next Monday. Next available for [place] is tomorrow morning.  Checking with referral coordinator to see where we can get the patient scheduled.

## 2022-10-03 NOTE — Telephone Encounter (Signed)
Called to update daughter, Lennox Laity, on the medication and [place].   Pt scheduled at [facility] at 1:00 today for STAT US.

## 2022-10-03 NOTE — Addendum Note (Signed)
Addended by: Salvatore Decent on: 10/03/2022 04:37 PM   Modules accepted: Orders

## 2022-10-03 NOTE — Telephone Encounter (Signed)
Pt daughter reports they never received a call to schedule patient's [place] for to check for DVT. Also, the medication that was prescribed for Gout, colchicine 0.6 MG tablet is causing significant diarrhea.  Pt has taken allopurinol (Zyloprim) 100mg  1tab Daily, without incident, daughter reports.  Will check with Salvatore Decent, NP to see what office [place] order was sent to. I am unable to determine from the order.

## 2022-10-03 NOTE — Telephone Encounter (Signed)
Spoke with Roanna Raider, she is getting this taken care of. Will call the patient's daughter and let her know.

## 2022-10-05 ENCOUNTER — Encounter: Payer: Self-pay | Admitting: Family Medicine

## 2022-10-05 DIAGNOSIS — E559 Vitamin D deficiency, unspecified: Secondary | ICD-10-CM

## 2022-10-05 DIAGNOSIS — D509 Iron deficiency anemia, unspecified: Secondary | ICD-10-CM

## 2022-10-05 MED ORDER — FERROUS SULFATE 325 (65 FE) MG PO TABS: 325.0000 mg | ORAL_TABLET | Freq: Every day | ORAL | 5 refills | Status: DC

## 2022-10-05 MED ORDER — VITAMIN D (ERGOCALCIFEROL) 1.25 MG (50000 UNIT) PO CAPS: 50000.0000 [IU] | ORAL_CAPSULE | ORAL | 1 refills | Status: DC

## 2022-10-18 ENCOUNTER — Ambulatory Visit: Payer: Self-pay | Admitting: Neurology

## 2022-10-24 ENCOUNTER — Ambulatory Visit: Payer: Medicare Other | Admitting: Family Medicine

## 2022-10-24 ENCOUNTER — Encounter: Payer: Self-pay | Admitting: Family Medicine

## 2022-10-24 VITALS — BP 152/72 | HR 76 | Temp 98.0°F | Ht 66.0 in | Wt 180.6 lb

## 2022-10-24 DIAGNOSIS — R413 Other amnesia: Secondary | ICD-10-CM

## 2022-10-24 DIAGNOSIS — I10 Essential (primary) hypertension: Secondary | ICD-10-CM

## 2022-10-24 DIAGNOSIS — Z8739 Personal history of other diseases of the musculoskeletal system and connective tissue: Secondary | ICD-10-CM

## 2022-10-24 DIAGNOSIS — I872 Venous insufficiency (chronic) (peripheral): Secondary | ICD-10-CM | POA: Diagnosis not present

## 2022-10-24 DIAGNOSIS — R159 Full incontinence of feces: Secondary | ICD-10-CM | POA: Diagnosis not present

## 2022-10-24 MED ORDER — METHYLCELLULOSE (LAXATIVE) PO POWD
ORAL | 1 refills | Status: DC
Start: 2022-10-24 — End: 2022-10-24

## 2022-10-24 MED ORDER — METHYLCELLULOSE (LAXATIVE) PO POWD: ORAL | 1 refills | Status: AC

## 2022-10-24 MED ORDER — ALLOPURINOL 100 MG PO TABS
100.0000 mg | ORAL_TABLET | Freq: Every day | ORAL | 3 refills | Status: DC
Start: 2022-10-24 — End: 2023-02-28

## 2022-10-24 MED ORDER — COLCHICINE 0.6 MG PO TABS
0.6000 mg | ORAL_TABLET | Freq: Every day | ORAL | 2 refills | Status: DC
Start: 2022-10-24 — End: 2023-01-02

## 2022-10-24 MED ORDER — COLCHICINE 0.6 MG PO TABS: 0.6000 mg | ORAL_TABLET | Freq: Every day | ORAL | 2 refills | Status: DC

## 2022-10-24 MED ORDER — ALLOPURINOL 100 MG PO TABS
100.0000 mg | ORAL_TABLET | Freq: Every day | ORAL | 3 refills | Status: DC
Start: 2022-10-24 — End: 2022-10-24

## 2022-10-24 NOTE — Progress Notes (Signed)
Established Patient Office Visit   Subjective:  Patient ID: Samantha Briggs, female    DOB: Dec 21, 1932  Age: 89 y.o. MRN: [id number]  No chief complaint on file.   HPI Encounter Diagnoses  Name Primary?   Venous incompetence Yes   Incontinence of feces, unspecified fecal incontinence type    Essential hypertension    Memory change    History of gout    Ongoing issue with fecal incontinence.  She is wearing depends.  Stool was typically loose to formed.  It is sometimes watery.  She has had no fevers or chills.  No blood or pus in her stools.  No recent antibiotics.  She does not always go to toilet when she first feels the urge to defecate.  There is swelling in her feet.  There has been no shortness of breath, chest pain or orthopnea.  She is reluctant to use compression stockings out of the fashion since.  Uric acid was found to be quite high.  Was diagnosed recently with a gouty flare in her left great toe.  It remains a little sore.   Review of Systems  Constitutional: Negative.  Negative for diaphoresis.  HENT: Negative.    Eyes:  Negative for blurred vision, discharge and redness.  Respiratory: Negative.  Negative for shortness of breath.   Cardiovascular: Negative.  Negative for chest pain and orthopnea.  Gastrointestinal:  Negative for abdominal pain, nausea and vomiting.  Genitourinary: Negative.   Musculoskeletal:  Positive for joint pain. Negative for myalgias.  Skin:  Negative for rash.  Neurological:  Negative for tingling, loss of consciousness and weakness.  Endo/Heme/Allergies:  Negative for polydipsia.     Current Outpatient Medications:    alendronate (FOSAMAX) 70 MG tablet, Take 1 tablet (70 mg total) by mouth every 7 (seven) days. Take with a full glass of water on an empty stomach., Disp: 12 tablet, Rfl: 4   allopurinol (ZYLOPRIM) 100 MG tablet, Take 1 tablet (100 mg total) by mouth daily., Disp: 30 tablet, Rfl: 3   colchicine 0.6 MG tablet, Take 1  tablet (0.6 mg total) by mouth daily., Disp: 30 tablet, Rfl: 2   cyanocobalamin (VITAMIN B12) 1000 MCG tablet, Take 1 tablet (1,000 mcg total) by mouth daily. TAKE 1 TABLET (1,000 MCG TOTAL) BY MOUTH DAILY., Disp: 90 tablet, Rfl: 1   dorzolamide (TRUSOPT) 2 % ophthalmic solution, , Disp: , Rfl: 11   fenofibrate 160 MG tablet, TAKE 1 TABLET BY MOUTH DAILY, Disp: 100 tablet, Rfl: 2   ferrous sulfate 325 (65 FE) MG tablet, Take 1 tablet (325 mg total) by mouth daily with breakfast., Disp: 30 tablet, Rfl: 5   fluticasone (FLONASE) 50 MCG/ACT nasal spray, PLACE TWO SPRAYS INTO BOTH NOSTRILS DAILY, Disp: 16 mL, Rfl: 4   hydrochlorothiazide (HYDRODIURIL) 25 MG tablet, Take 1 tablet (25 mg total) by mouth daily., Disp: 90 tablet, Rfl: 3   latanoprost (XALATAN) 0.005 % ophthalmic solution, Place 1 drop into the left eye daily. , Disp: , Rfl:    levocetirizine (XYZAL) 5 MG tablet, TAKE 1 TABLET BY MOUTH IN THE  EVENING, Disp: 90 tablet, Rfl: 0   levothyroxine (SYNTHROID) 88 MCG tablet, Take 1 tablet (88 mcg total) by mouth daily., Disp: 90 tablet, Rfl: 3   methylcellulose oral powder, Dissolve 1 to 2 tablespoons in the liquid beverage daily as needed., Disp: 454 g, Rfl: 1   olmesartan (BENICAR) 40 MG tablet, TAKE 1 TABLET BY MOUTH DAILY, Disp: 100 tablet, Rfl: 2  omeprazole (PRILOSEC) 20 MG capsule, TAKE 1 CAPSULE BY MOUTH DAILY, Disp: 90 capsule, Rfl: 3   PRED MILD 0.12 % ophthalmic suspension, Place 1 drop into both eyes. , Disp: , Rfl:    promethazine-dextromethorphan (PROMETHAZINE-DM) 6.25-15 MG/5ML syrup, Take 5 mLs by mouth 3 (three) times daily as needed for cough. (Patient not taking: Reported on 09/29/2022), Disp: 118 mL, Rfl: 0   sertraline (ZOLOFT) 100 MG tablet, TAKE 1 TABLET BY MOUTH DAILY, Disp: 90 tablet, Rfl: 3   Vitamin D, Ergocalciferol, (DRISDOL) 1.25 MG (50000 UNIT) CAPS capsule, Take 1 capsule (50,000 Units total) by mouth every 7 (seven) days. TAKE ONE CAPSULE BY MOUTH EVERY SEVEN DAYS,  Disp: 15 capsule, Rfl: 1   Objective:     BP (!) 152/72   Pulse 76   Temp 98 F (36.7 C)   Ht 5\' 6"  (1.676 m)   Wt 180 lb 9.6 oz (81.9 kg)   SpO2 96%   BMI 29.15 kg/m  BP Readings from Last 3 Encounters:  10/24/22 (!) 152/72  09/29/22 104/62  09/20/22 134/82   Wt Readings from Last 3 Encounters:  10/24/22 180 lb 9.6 oz (81.9 kg)  09/20/22 179 lb 6.4 oz (81.4 kg)  06/21/22 180 lb (81.6 kg)      Physical Exam Constitutional:      General: She is not in acute distress.    Appearance: Normal appearance. She is not ill-appearing, toxic-appearing or diaphoretic.  HENT:     Head: Normocephalic and atraumatic.     Right Ear: External ear normal.     Left Ear: External ear normal.     Mouth/Throat:     Mouth: Mucous membranes are moist.     Pharynx: Oropharynx is clear. No oropharyngeal exudate or posterior oropharyngeal erythema.  Eyes:     General: No scleral icterus.       Right eye: No discharge.        Left eye: No discharge.     Extraocular Movements: Extraocular movements intact.     Conjunctiva/sclera: Conjunctivae normal.     Pupils: Pupils are equal, round, and reactive to light.  Cardiovascular:     Rate and Rhythm: Normal rate and regular rhythm.  Pulmonary:     Effort: Pulmonary effort is normal. No respiratory distress.     Breath sounds: Normal breath sounds. No wheezing, rhonchi or rales.  Abdominal:     General: Bowel sounds are normal.     Tenderness: There is no abdominal tenderness. There is no guarding.  Musculoskeletal:     Cervical back: No rigidity or tenderness.     Right lower leg: No edema.     Left lower leg: No edema.     Right foot: Swelling present.     Left foot: Swelling and tenderness (mild ttp of 1rst mtp) present.  Skin:    General: Skin is warm and dry.  Neurological:     Mental Status: She is alert and oriented to person, place, and time.  Psychiatric:        Mood and Affect: Mood normal.        Behavior: Behavior normal.       No results found for any visits on 10/24/22.    The ASCVD Risk score (Arnett DK, et al., 2019) failed to calculate for the following reasons:   The 2019 ASCVD risk score is only valid for ages 20 to 29    Assessment & Plan:   Venous incompetence  Incontinence of feces,  unspecified fecal incontinence type -     Methylcellulose (Laxative); Dissolve 1 to 2 tablespoons in the liquid beverage daily as needed.  Dispense: 454 g; Refill: 1  Essential hypertension  Memory change  History of gout -     Allopurinol; Take 1 tablet (100 mg total) by mouth daily.  Dispense: 30 tablet; Refill: 3 -     Colchicine; Take 1 tablet (0.6 mg total) by mouth daily.  Dispense: 30 tablet; Refill: 2    Return in about 4 weeks (around 11/21/2022).  Will start allopurinol with colchicine to lower uric acid.  Will use TED stockings for swelling in her feet.  Continue olmesartan and HCTZ for now.  Daughter be certain that she is receiving her blood pressure medications.  Will need to change HCTZ to a different diuretic and possibly change olmesartan as well.  Urged her to toilet as soon as she feels an urge to defecate.  Advised her to avoid sodium is much as possible.  Will use methylcellulose agent.  Mliss Sax, MD

## 2022-10-25 ENCOUNTER — Ambulatory Visit: Payer: Medicare Other | Admitting: Family Medicine

## 2022-11-22 ENCOUNTER — Encounter: Payer: Self-pay | Admitting: Family Medicine

## 2022-11-22 ENCOUNTER — Ambulatory Visit (INDEPENDENT_AMBULATORY_CARE_PROVIDER_SITE_OTHER): Payer: Medicare Other | Admitting: Family Medicine

## 2022-11-22 VITALS — BP 128/78 | HR 102 | Temp 98.2°F | Ht 66.0 in | Wt 179.6 lb

## 2022-11-22 DIAGNOSIS — E538 Deficiency of other specified B group vitamins: Secondary | ICD-10-CM | POA: Diagnosis not present

## 2022-11-22 DIAGNOSIS — Z23 Encounter for immunization: Secondary | ICD-10-CM

## 2022-11-22 DIAGNOSIS — M10071 Idiopathic gout, right ankle and foot: Secondary | ICD-10-CM | POA: Diagnosis not present

## 2022-11-22 DIAGNOSIS — R159 Full incontinence of feces: Secondary | ICD-10-CM | POA: Diagnosis not present

## 2022-11-22 DIAGNOSIS — I10 Essential (primary) hypertension: Secondary | ICD-10-CM | POA: Diagnosis not present

## 2022-11-22 DIAGNOSIS — E559 Vitamin D deficiency, unspecified: Secondary | ICD-10-CM

## 2022-11-22 DIAGNOSIS — E038 Other specified hypothyroidism: Secondary | ICD-10-CM

## 2022-11-22 DIAGNOSIS — J302 Other seasonal allergic rhinitis: Secondary | ICD-10-CM

## 2022-11-22 LAB — BASIC METABOLIC PANEL
BUN: 24 mg/dL — ABNORMAL HIGH (ref 6–23)
CO2: 28 meq/L (ref 19–32)
Calcium: 9.5 mg/dL (ref 8.4–10.5)
Chloride: 104 meq/L (ref 96–112)
Creatinine, Ser: 1.14 mg/dL (ref 0.40–1.20)
GFR: 43.17 mL/min — ABNORMAL LOW (ref >60.00)
Glucose, Bld: 92 mg/dL (ref 70–99)
Potassium: 4 meq/L (ref 3.5–5.1)
Sodium: 141 meq/L (ref 135–145)

## 2022-11-22 LAB — CBC
HCT: 39.5 % (ref 36.0–46.0)
Hemoglobin: 12.8 g/dL (ref 12.0–15.0)
MCHC: 32.5 g/dL (ref 30.0–36.0)
MCV: 87.6 fL (ref 78.0–100.0)
Platelets: 180 10*3/uL (ref 150.0–400.0)
RBC: 4.51 Mil/uL (ref 3.87–5.11)
RDW: 15.2 % (ref 11.5–15.5)
WBC: 5.7 10*3/uL (ref 4.0–10.5)

## 2022-11-22 LAB — VITAMIN D 25 HYDROXY (VIT D DEFICIENCY, FRACTURES): VITD: 30.35 ng/mL (ref 30.00–100.00)

## 2022-11-22 LAB — VITAMIN B12: Vitamin B-12: >1537 pg/mL — ABNORMAL HIGH (ref 211–911)

## 2022-11-22 LAB — TSH: TSH: 4.23 u[IU]/mL (ref 0.35–5.50)

## 2022-11-22 NOTE — Progress Notes (Signed)
Established Patient Office Visit   Subjective:  Patient ID: Samantha Briggs, female    DOB: 1932-06-10  Age: 89 y.o. MRN: [id number]  Chief Complaint  Patient presents with   Nasal Congestion    Nasal congestion, runny nose and productive cough x 4 days.    Medical Management of Chronic Issues    4 week follow up    HPI Encounter Diagnoses  Name Primary?   Seasonal allergic rhinitis, unspecified trigger Yes   Incontinence of feces, unspecified fecal incontinence type    Need for immunization against influenza    Vitamin D deficiency    B12 deficiency    Essential hypertension    Other specified hypothyroidism    Acute idiopathic gout of right foot    Swelling in right ankle is improved.  She has been taking the colchicine with a new dose of allopurinol.  3 to 4-day history of congestion with postnasal drip and a dry cough.  There is been no fevers or chills, wheezing or difficulty breathing.  No facial pressure.  Continues HCTZ and olmesartan 40 for hypertension.  Continues levothyroxine 88 mcg for hypothyroidism.  Continues high-dose weekly vitamin D tablet for vitamin D deficiency.  Methylcellulose has helped with fecal incontinence.   Review of Systems  Constitutional: Negative.  Negative for chills and fever.  HENT:  Positive for congestion.   Eyes:  Negative for blurred vision, discharge and redness.  Respiratory:  Positive for cough. Negative for sputum production, shortness of breath and wheezing.   Cardiovascular: Negative.   Gastrointestinal:  Negative for abdominal pain and diarrhea.  Genitourinary: Negative.   Musculoskeletal: Negative.  Negative for myalgias.  Skin:  Negative for rash.  Neurological:  Negative for tingling, loss of consciousness and weakness.  Endo/Heme/Allergies:  Negative for polydipsia.     Current Outpatient Medications:    allopurinol (ZYLOPRIM) 100 MG tablet, Take 1 tablet (100 mg total) by mouth daily., Disp: 30 tablet, Rfl: 3    colchicine 0.6 MG tablet, Take 1 tablet (0.6 mg total) by mouth daily., Disp: 30 tablet, Rfl: 2   cyanocobalamin (VITAMIN B12) 1000 MCG tablet, Take 1 tablet (1,000 mcg total) by mouth daily. TAKE 1 TABLET (1,000 MCG TOTAL) BY MOUTH DAILY., Disp: 90 tablet, Rfl: 1   dorzolamide (TRUSOPT) 2 % ophthalmic solution, , Disp: , Rfl: 11   fenofibrate 160 MG tablet, TAKE 1 TABLET BY MOUTH DAILY, Disp: 100 tablet, Rfl: 2   ferrous sulfate 325 (65 FE) MG tablet, Take 1 tablet (325 mg total) by mouth daily with breakfast., Disp: 30 tablet, Rfl: 5   fluticasone (FLONASE) 50 MCG/ACT nasal spray, PLACE TWO SPRAYS INTO BOTH NOSTRILS DAILY, Disp: 16 mL, Rfl: 4   hydrochlorothiazide (HYDRODIURIL) 25 MG tablet, Take 1 tablet (25 mg total) by mouth daily., Disp: 90 tablet, Rfl: 3   latanoprost (XALATAN) 0.005 % ophthalmic solution, Place 1 drop into the left eye daily. , Disp: , Rfl:    levocetirizine (XYZAL) 5 MG tablet, TAKE 1 TABLET BY MOUTH IN THE  EVENING, Disp: 90 tablet, Rfl: 0   levothyroxine (SYNTHROID) 88 MCG tablet, Take 1 tablet (88 mcg total) by mouth daily., Disp: 90 tablet, Rfl: 3   methylcellulose oral powder, Dissolve 1 to 2 tablespoons in the liquid beverage daily as needed., Disp: 454 g, Rfl: 1   olmesartan (BENICAR) 40 MG tablet, TAKE 1 TABLET BY MOUTH DAILY, Disp: 100 tablet, Rfl: 2   omeprazole (PRILOSEC) 20 MG capsule, TAKE 1 CAPSULE BY  MOUTH DAILY, Disp: 90 capsule, Rfl: 3   PRED MILD 0.12 % ophthalmic suspension, Place 1 drop into both eyes. , Disp: , Rfl:    promethazine-dextromethorphan (PROMETHAZINE-DM) 6.25-15 MG/5ML syrup, Take 5 mLs by mouth 3 (three) times daily as needed for cough., Disp: 118 mL, Rfl: 0   sertraline (ZOLOFT) 100 MG tablet, TAKE 1 TABLET BY MOUTH DAILY, Disp: 90 tablet, Rfl: 3   Vitamin D, Ergocalciferol, (DRISDOL) 1.25 MG (50000 UNIT) CAPS capsule, Take 1 capsule (50,000 Units total) by mouth every 7 (seven) days. TAKE ONE CAPSULE BY MOUTH EVERY SEVEN DAYS, Disp: 15  capsule, Rfl: 1   alendronate (FOSAMAX) 70 MG tablet, Take 1 tablet (70 mg total) by mouth every 7 (seven) days. Take with a full glass of water on an empty stomach., Disp: 12 tablet, Rfl: 4   Objective:     BP 128/78   Pulse (!) 102   Temp 98.2 F (36.8 C)   Ht 5\' 6"  (1.676 m)   Wt 179 lb 9.6 oz (81.5 kg)   SpO2 97%   BMI 28.99 kg/m    Physical Exam Constitutional:      General: She is not in acute distress.    Appearance: Normal appearance. She is not ill-appearing, toxic-appearing or diaphoretic.  HENT:     Head: Normocephalic and atraumatic.     Right Ear: External ear normal.     Left Ear: External ear normal.     Mouth/Throat:     Mouth: Mucous membranes are moist.     Pharynx: Oropharynx is clear. No oropharyngeal exudate or posterior oropharyngeal erythema.  Eyes:     General: No scleral icterus.       Right eye: No discharge.        Left eye: No discharge.     Extraocular Movements: Extraocular movements intact.     Conjunctiva/sclera: Conjunctivae normal.     Pupils: Pupils are equal, round, and reactive to light.  Cardiovascular:     Rate and Rhythm: Normal rate and regular rhythm.  Pulmonary:     Effort: Pulmonary effort is normal. No respiratory distress.     Breath sounds: Normal breath sounds. No wheezing, rhonchi or rales.  Musculoskeletal:     Cervical back: No rigidity or tenderness.  Skin:    General: Skin is warm and dry.  Neurological:     Mental Status: She is alert and oriented to person, place, and time.  Psychiatric:        Mood and Affect: Mood normal.        Behavior: Behavior normal.      No results found for any visits on 11/22/22.    The ASCVD Risk score (Arnett DK, et al., 2019) failed to calculate for the following reasons:   The 2019 ASCVD risk score is only valid for ages 55 to 24    Assessment & Plan:   Seasonal allergic rhinitis, unspecified trigger  Incontinence of feces, unspecified fecal incontinence type  Need  for immunization against influenza -     Flu Vaccine Trivalent High Dose (Fluad)  Vitamin D deficiency -     VITAMIN D 25 Hydroxy (Vit-D Deficiency, Fractures)  B12 deficiency -     Vitamin B12  Essential hypertension -     Basic metabolic panel -     CBC  Other specified hypothyroidism -     TSH  Acute idiopathic gout of right foot    Return in about 6 months (around 05/22/2023), or  if symptoms worsen or fail to improve.  Daughter will make certain that she has been taking her Flonase.  Will follow-up in a few weeks if not improving.  Continue all medicines as above.  Mliss Sax, MD

## 2022-12-14 DIAGNOSIS — H31002 Unspecified chorioretinal scars, left eye: Secondary | ICD-10-CM | POA: Diagnosis not present

## 2022-12-14 DIAGNOSIS — Z961 Presence of intraocular lens: Secondary | ICD-10-CM | POA: Diagnosis not present

## 2022-12-14 DIAGNOSIS — H04123 Dry eye syndrome of bilateral lacrimal glands: Secondary | ICD-10-CM | POA: Diagnosis not present

## 2022-12-14 DIAGNOSIS — H401123 Primary open-angle glaucoma, left eye, severe stage: Secondary | ICD-10-CM | POA: Diagnosis not present

## 2022-12-20 ENCOUNTER — Other Ambulatory Visit: Payer: Self-pay | Admitting: Family Medicine

## 2022-12-20 DIAGNOSIS — I10 Essential (primary) hypertension: Secondary | ICD-10-CM

## 2022-12-31 ENCOUNTER — Other Ambulatory Visit: Payer: Self-pay | Admitting: Family Medicine

## 2022-12-31 DIAGNOSIS — E559 Vitamin D deficiency, unspecified: Secondary | ICD-10-CM

## 2022-12-31 DIAGNOSIS — Z8739 Personal history of other diseases of the musculoskeletal system and connective tissue: Secondary | ICD-10-CM

## 2023-01-19 ENCOUNTER — Ambulatory Visit: Payer: Medicare Other | Admitting: Neurology

## 2023-01-19 ENCOUNTER — Encounter: Payer: Self-pay | Admitting: Neurology

## 2023-01-19 VITALS — BP 138/80 | HR 82 | Resp 16 | Wt 179.7 lb

## 2023-01-19 DIAGNOSIS — R413 Other amnesia: Secondary | ICD-10-CM

## 2023-01-19 DIAGNOSIS — G309 Alzheimer's disease, unspecified: Secondary | ICD-10-CM

## 2023-01-19 DIAGNOSIS — G3184 Mild cognitive impairment, so stated: Secondary | ICD-10-CM | POA: Diagnosis not present

## 2023-01-19 NOTE — Progress Notes (Addendum)
[facility]  PATIENT: Samantha Briggs DOB: 1932-07-14  REQUESTING CLINICIAN: Mliss Sax,* HISTORY FROM: Patient/Daughter  REASON FOR VISIT: Memory loss    HISTORICAL  CHIEF COMPLAINT:  Chief Complaint  Patient presents with   New Patient (Initial Visit)    Rm12, daughter present,  Internal referral for cognitive decline:moca 16    HISTORY OF PRESENT ILLNESS:  This 89 year old woman past medical history hypertension, hyperlipidemia, glaucoma, depression, hypothyroidism who is presenting with her daughter for evaluation of memory loss.  Daughter tells me that her memory loss has started over the past year.  She is forgetful, repetitive and currently patient is living in assisted living facility at [facility].  Daughter tells me that patient most of the time stays by herself and she is not interactive.  She does not participate in physical therapy or in some of the social events.  Patient tells me that she feels fine.  She  is forgetful sometimes, but does not think this is a big issue for her age. She tells me that sometimes, she is not interested in the conversation, so she does not pay attention and she is unable to recall.  When discussing about her social isolation, she tells me that she likes her own company.   TBI:  No past history of TBI Stroke:  no past history of stroke Seizures:  no past history of seizures Sleep:  no history of sleep apnea.  Mood: Depression, on Zoloft  Family history of Dementia: Denies  Functional status: independent in ADLs Patient lives in an assisted living facility [address]. Cooking: make breakfast and lunch  Cleaning: yes Shopping: no Bathing: no help needed Toileting: no help needed  Driving: has not driven in the past 6 years  Bills: Daughter  Ever left the stove on by accident?: n/a Forget how to use items around the house?: Denies Getting lost going to familiar places?: Yes, but at that  time she move from [place]  Forgetting loved ones names?: Denies  Word finding difficulty? Denies  Sleep: Good    OTHER MEDICAL CONDITIONS: Hypertension, Hypothyroidism, Depression   REVIEW OF SYSTEMS: Full 14 system review of systems performed and negative with exception of: As noted in the HPI   ALLERGIES: Allergies  Allergen Reactions   Morphine And Codeine Hives   Neurontin [Gabapentin]     hives    HOME MEDICATIONS: Outpatient Medications Prior to Visit  Medication Sig Dispense Refill   alendronate (FOSAMAX) 70 MG tablet Take 1 tablet (70 mg total) by mouth every 7 (seven) days. Take with a full glass of water on an empty stomach. 12 tablet 4   allopurinol (ZYLOPRIM) 100 MG tablet Take 1 tablet (100 mg total) by mouth daily. 30 tablet 3   colchicine 0.6 MG tablet TAKE ONE TABLET BY MOUTH ONE TIME DAILY 30 tablet 2   cyanocobalamin (VITAMIN B12) 1000 MCG tablet Take 1 tablet (1,000 mcg total) by mouth daily. TAKE 1 TABLET (1,000 MCG TOTAL) BY MOUTH DAILY. 90 tablet 1   dorzolamide (TRUSOPT) 2 % ophthalmic solution   11   fenofibrate 160 MG tablet TAKE 1 TABLET BY MOUTH DAILY 100 tablet 2   ferrous sulfate 325 (65 FE) MG tablet Take 1 tablet (325 mg total) by mouth daily with breakfast. 30 tablet 5   fluticasone (FLONASE) 50 MCG/ACT nasal spray PLACE TWO SPRAYS INTO BOTH NOSTRILS DAILY 16 mL 4   hydrochlorothiazide (HYDRODIURIL) 25 MG tablet Take 1 tablet (25 mg total)  by mouth daily. 90 tablet 3   latanoprost (XALATAN) 0.005 % ophthalmic solution Place 1 drop into the left eye daily.      levocetirizine (XYZAL) 5 MG tablet TAKE 1 TABLET BY MOUTH IN THE  EVENING 90 tablet 0   levothyroxine (SYNTHROID) 88 MCG tablet Take 1 tablet (88 mcg total) by mouth daily. 90 tablet 3   methylcellulose oral powder Dissolve 1 to 2 tablespoons in the liquid beverage daily as needed. 454 g 1   olmesartan (BENICAR) 40 MG tablet TAKE 1 TABLET BY MOUTH DAILY 100 tablet 2   omeprazole  (PRILOSEC) 20 MG capsule TAKE 1 CAPSULE BY MOUTH DAILY 90 capsule 3   PRED MILD 0.12 % ophthalmic suspension Place 1 drop into both eyes.      sertraline (ZOLOFT) 100 MG tablet TAKE 1 TABLET BY MOUTH DAILY 90 tablet 3   Vitamin D, Ergocalciferol, (DRISDOL) 1.25 MG (50000 UNIT) CAPS capsule TAKE ONE CAPSULE BY MOUTH EVERY 7 DAYS 15 capsule 1   promethazine-dextromethorphan (PROMETHAZINE-DM) 6.25-15 MG/5ML syrup Take 5 mLs by mouth 3 (three) times daily as needed for cough. 118 mL 0   No facility-administered medications prior to visit.    PAST MEDICAL HISTORY: Past Medical History:  Diagnosis Date   Allergy    Anemia    Depression    Glaucoma    Hyperlipidemia    Hypertension     PAST SURGICAL HISTORY: Past Surgical History:  Procedure Laterality Date   broken foot Left    CHOLECYSTECTOMY     CORNEAL TRANSPLANT     x2   DILATION AND CURETTAGE OF UTERUS     RETINAL DETACHMENT SURGERY      FAMILY HISTORY: Family History  Problem Relation Age of Onset   Cancer Mother    Breast cancer Mother    Cancer Father    Pancreatic cancer Father    Diabetes Sister     SOCIAL HISTORY: Social History   Socioeconomic History   Marital status: Married    Spouse name: Not on file   Number of children: Not on file   Years of education: Not on file   Highest education level: 12th grade  Occupational History   Not on file  Tobacco Use   Smoking status: Former    Current packs/day: 0.00    Types: Cigarettes    Quit date: 01/04/1972    Years since quitting: 51.0   Smokeless tobacco: Never  Vaping Use   Vaping status: Never Used  Substance and Sexual Activity   Alcohol use: No   Drug use: No   Sexual activity: Not Currently  Other Topics Concern   Not on file  Social History Narrative   Not on file   Social Drivers of Health   Financial Resource Strain: Low Risk  (04/06/2022)   Overall Financial Resource Strain (CARDIA)    Difficulty of Paying Living Expenses: Not hard at  all  Food Insecurity: No Food Insecurity (04/06/2022)   Hunger Vital Sign    Worried About Running Out of Food in the Last Year: Never true    Ran Out of Food in the Last Year: Never true  Transportation Needs: No Transportation Needs (04/06/2022)   PRAPARE - Administrator, Civil Service (Medical): No    Lack of Transportation (Non-Medical): No  Physical Activity: Unknown (04/06/2022)   Exercise Vital Sign    Days of Exercise per Week: 0 days    Minutes of Exercise per Session: Not  on file  Recent Concern: Physical Activity - Inactive (04/06/2022)   Exercise Vital Sign    Days of Exercise per Week: 0 days    Minutes of Exercise per Session: 30 min  Stress: No Stress Concern Present (04/06/2022)   [employer] of Occupational Health - Occupational Stress Questionnaire    Feeling of Stress : Not at all  Social Connections: Moderately Isolated (04/06/2022)   Social Connection and Isolation Panel [NHANES]    Frequency of Communication with Friends and Family: Once a week    Frequency of Social Gatherings with Friends and Family: Once a week    Attends Religious Services: 1 to 4 times per year    Active Member of [employer] or Organizations: Yes    Attends Banker Meetings: 1 to 4 times per year    Marital Status: Widowed  Intimate Partner Violence: Not At Risk (04/27/2021)   Humiliation, Afraid, Rape, and Kick questionnaire    Fear of Current or Ex-Partner: No    Emotionally Abused: No    Physically Abused: No    Sexually Abused: No    PHYSICAL EXAM  GENERAL EXAM/CONSTITUTIONAL: Vitals:  Vitals:   01/19/23 1323  BP: 138/80  Pulse: 82  Resp: 16  Weight: 179 lb 10.8 oz (81.5 kg)   Body mass index is 29 kg/m. Wt Readings from Last 3 Encounters:  01/19/23 179 lb 10.8 oz (81.5 kg)  11/22/22 179 lb 9.6 oz (81.5 kg)  10/24/22 180 lb 9.6 oz (81.9 kg)   Patient is in no distress; well developed, nourished and groomed; neck is supple  MUSCULOSKELETAL: Gait,  strength, tone, movements noted in Neurologic exam below  NEUROLOGIC: MENTAL STATUS:      No data to display            01/19/2023    1:26 PM  Montreal Cognitive Assessment   Visuospatial/ Executive (0/5) 3  Naming (0/3) 3  Attention: Read list of digits (0/2) 1  Attention: Read list of letters (0/1) 1  Attention: Serial 7 subtraction starting at 100 (0/3) 1  Language: Repeat phrase (0/2) 1  Language : Fluency (0/1) 1  Abstraction (0/2) 2  Delayed Recall (0/5) 1  Orientation (0/6) 2  Total 16    CRANIAL NERVE:  2nd, 3rd, 4th, 6th- visual fields full to confrontation, extraocular muscles intact, no nystagmus 5th - facial sensation symmetric 7th - facial strength symmetric 8th - hearing intact 9th - palate elevates symmetrically, uvula midline 11th - shoulder shrug symmetric 12th - tongue protrusion midline  MOTOR:  normal bulk and tone, full strength in the BUE, BLE  SENSORY:  normal and symmetric to light touch  COORDINATION:  finger-nose-finger, fine finger movements normal   DIAGNOSTIC DATA (LABS, IMAGING, TESTING) - I reviewed patient records, labs, notes, testing and imaging myself where available.  Lab Results  Component Value Date   WBC 5.7 11/22/2022   HGB 12.8 11/22/2022   HCT 39.5 11/22/2022   MCV 87.6 11/22/2022   PLT 180.0 11/22/2022      Component Value Date/Time   NA 141 11/22/2022 1346   K 4.0 11/22/2022 1346   CL 104 11/22/2022 1346   CO2 28 11/22/2022 1346   GLUCOSE 92 11/22/2022 1346   BUN 24 (H) 11/22/2022 1346   CREATININE 1.14 11/22/2022 1346   CREATININE 0.67 09/05/2014 1541   CALCIUM 9.5 11/22/2022 1346   PROT 6.8 06/21/2021 1106   ALBUMIN 4.3 06/21/2021 1106   AST 12 06/21/2021 1106  ALT 8 06/21/2021 1106   ALKPHOS 54 06/21/2021 1106   BILITOT 0.4 06/21/2021 1106   GFRNONAA 61 (L) 06/17/2011 1639   GFRAA 70 (L) 06/17/2011 1639   Lab Results  Component Value Date   CHOL 194 06/21/2021   HDL 42.30 06/21/2021    LDLCALC 117 (H) 06/21/2021   LDLDIRECT 135.0 06/21/2021   TRIG 171.0 (H) 06/21/2021   CHOLHDL 5 06/21/2021   Lab Results  Component Value Date   HGBA1C 5.7 01/20/2021   Lab Results  Component Value Date   VITAMINB12 >1537 (H) 11/22/2022   Lab Results  Component Value Date   TSH 4.23 11/22/2022      ASSESSMENT AND PLAN  89 y.o. year old female with hypertension, hyperlipidemia, glaucoma, hypothyroidism, depression who is presenting with her daughter with memory loss.  This is described as being forgetful, not able to remember and more isolation.  On exam today she scored a 16 out of 30 on MOCA indicative of impairment but during the visit she was able to give me a coherent history.  Patient likely has mild impairment versus very mild dementia based on MOCA score.  Plan will be to obtain the dementia lab including ATN profile, and head CT.  If ATN profile indicative of Alzheimer disease biomarker, we will discuss starting acetylcholinesterase inhibitor.  Both patient and daughter comfortable with plan.  Continue to follow with PCP return as need   1. Mild cognitive impairment      Patient Instructions  Most recent B12 and TSH are normal, will check ATN profile to look for Alzheimer disease pathology  CT head. I will contact you to go over the results.  Continue current medications  Continue to follow up with PCP  Return as needed   There are well-accepted and sensible ways to reduce risk for Alzheimers disease and other degenerative brain disorders .  Exercise Daily Walk A daily 20 minute walk should be part of your routine. Disease related apathy can be a significant roadblock to exercise and the only way to overcome this is to make it a daily routine and perhaps have a reward at the end (something your loved one loves to eat or drink perhaps) or a personal trainer coming to the home can also be very useful. Most importantly, the patient is much more likely to exercise if the  caregiver / spouse does it with him/her. In general a structured, repetitive schedule is best.  General Health: Any diseases which effect your body will effect your brain such as a pneumonia, urinary infection, blood clot, heart attack or stroke. Keep contact with your primary care doctor for regular follow ups.  Sleep. A good nights sleep is healthy for the brain. Seven hours is recommended. If you have insomnia or poor sleep habits we can give you some instructions. If you have sleep apnea wear your mask.  Diet: Eating a heart healthy diet is also a good idea; fish and poultry instead of red meat, nuts (mostly non-peanuts), vegetables, fruits, olive oil or canola oil (instead of butter), minimal salt (use other spices to flavor foods), whole grain rice, bread, cereal and pasta and wine in moderation.Research is now showing that the MIND diet, which is a combination of The Mediterranean diet and the DASH diet, is beneficial for cognitive processing and longevity. Information about this diet can be found in The MIND Diet, a book by Alonna Minium, MS, RDN, and online at [employer]Wild[employer].es  Finances, Power of [address] and Advance Directives:  You should consider putting legal safeguards in place with regard to financial and medical decision making. While the spouse always has power of attorney for medical and financial issues in the absence of any form, you should consider what you want in case the spouse / caregiver is no longer around or capable of making decisions.   Orders Placed This Encounter  Procedures   CT HEAD WO CONTRAST ( )   ATN PROFILE    No orders of the defined types were placed in this encounter.   Return if symptoms worsen or fail to improve.  I have spent a total of 65 minutes dedicated to this patient today, preparing to see patient, performing a medically appropriate examination and evaluation, ordering tests and/or medications and procedures, and  counseling and educating the patient/family/caregiver; independently interpreting result and communicating results to the family/patient/caregiver; and documenting clinical information in the electronic medical record.   Windell Norfolk, MD 01/22/2023, 2:37 PM  [facility] [address], [address] [place], [place] [address] [phone]

## 2023-01-19 NOTE — Patient Instructions (Addendum)
Most recent B12 and TSH are normal, will check ATN profile to look for Alzheimer disease pathology  CT head. I will contact you to go over the results.  Continue current medications  Continue to follow up with PCP  Return as needed   There are well-accepted and sensible ways to reduce risk for Alzheimers disease and other degenerative brain disorders .  Exercise Daily Walk A daily 20 minute walk should be part of your routine. Disease related apathy can be a significant roadblock to exercise and the only way to overcome this is to make it a daily routine and perhaps have a reward at the end (something your loved one loves to eat or drink perhaps) or a personal trainer coming to the home can also be very useful. Most importantly, the patient is much more likely to exercise if the caregiver / spouse does it with him/her. In general a structured, repetitive schedule is best.  General Health: Any diseases which effect your body will effect your brain such as a pneumonia, urinary infection, blood clot, heart attack or stroke. Keep contact with your primary care doctor for regular follow ups.  Sleep. A good nights sleep is healthy for the brain. Seven hours is recommended. If you have insomnia or poor sleep habits we can give you some instructions. If you have sleep apnea wear your mask.  Diet: Eating a heart healthy diet is also a good idea; fish and poultry instead of red meat, nuts (mostly non-peanuts), vegetables, fruits, olive oil or canola oil (instead of butter), minimal salt (use other spices to flavor foods), whole grain rice, bread, cereal and pasta and wine in moderation.Research is now showing that the MIND diet, which is a combination of The Mediterranean diet and the DASH diet, is beneficial for cognitive processing and longevity. Information about this diet can be found in The MIND Diet, a book by Alonna Minium, MS, RDN, and online at [employer].es  Finances,  Power of [address] and Advance Directives: You should consider putting legal safeguards in place with regard to financial and medical decision making. While the spouse always has power of attorney for medical and financial issues in the absence of any form, you should consider what you want in case the spouse / caregiver is no longer around or capable of making decisions.

## 2023-01-24 ENCOUNTER — Telehealth: Payer: Self-pay | Admitting: Neurology

## 2023-01-24 LAB — ATN PROFILE
A -- Beta-amyloid 42/40 Ratio: 0.096 — ABNORMAL LOW (ref >0.102)
Beta-amyloid 40: 276.05 pg/mL
Beta-amyloid 42: 26.63 pg/mL
N -- NfL, Plasma: 5.71 pg/mL (ref 0.00–11.55)
T -- p-tau181: 3.2 pg/mL — ABNORMAL HIGH (ref 0.00–0.97)

## 2023-01-24 NOTE — Telephone Encounter (Signed)
[employer] medicare NPR sent to GI [phone] 

## 2023-01-26 ENCOUNTER — Ambulatory Visit
Admission: RE | Admit: 2023-01-26 | Discharge: 2023-01-26 | Disposition: A | Discharge status: Home / Self Care | Payer: Medicare Other | Source: Ambulatory Visit | Attending: Neurology | Admitting: Neurology

## 2023-01-26 DIAGNOSIS — G3184 Mild cognitive impairment, so stated: Secondary | ICD-10-CM

## 2023-01-26 DIAGNOSIS — G309 Alzheimer's disease, unspecified: Secondary | ICD-10-CM | POA: Diagnosis not present

## 2023-01-27 ENCOUNTER — Encounter: Payer: Self-pay | Admitting: Neurology

## 2023-01-27 ENCOUNTER — Other Ambulatory Visit: Payer: Self-pay | Admitting: Neurology

## 2023-01-27 MED ORDER — DONEPEZIL HCL 5 MG PO TABS
5.0000 mg | ORAL_TABLET | Freq: Every day | ORAL | 3 refills | Status: DC
Start: 2023-01-27 — End: 2023-05-24

## 2023-01-30 ENCOUNTER — Telehealth: Payer: Self-pay | Admitting: Family Medicine

## 2023-01-30 DIAGNOSIS — Z789 Other specified health status: Secondary | ICD-10-CM

## 2023-01-30 NOTE — Telephone Encounter (Signed)
Copied from CRM [id number]. Topic: Clinical - Medical Advice >> Jan 30, 2023 11:05 AM Steele Sizer wrote: Reason for CRM: Lennox Laity called and stated that her mom was recently diagnosed alzheimer and would like some medical advice on what else they would need to do to assist her. Callback number is [phone]

## 2023-02-01 NOTE — Telephone Encounter (Signed)
Spoke with daughter, Lennox Laity. To clarify, they are requesting resources from a case manager.Child psychotherapist for Alz patients. Ordering [id number] for this patient.

## 2023-02-01 NOTE — Addendum Note (Signed)
Addended by: Larey Dresser on: 02/01/2023 12:40 PM   Modules accepted: Orders

## 2023-02-02 ENCOUNTER — Telehealth: Payer: Self-pay | Admitting: *Deleted

## 2023-02-02 NOTE — Progress Notes (Signed)
Complex Care Management Note Care Guide Note  02/02/2023 Name: Samantha Briggs MRN: [id number] DOB: May 13, 1932   Complex Care Management Outreach Attempts: An unsuccessful telephone outreach was attempted today to offer the patient information about available complex care management services.  Follow Up Plan:  Additional outreach attempts will be made to offer the patient complex care management information and services.   Encounter Outcome:  No Answer  Gwenevere Ghazi  [facility]  [facility], [facility] Guide  Direct Dial: [phone]  Fax [phone]

## 2023-02-03 NOTE — Progress Notes (Signed)
Complex Care Management Note  Care Guide Note 02/03/2023 Name: Samantha Briggs MRN: [id number] DOB: [birth year]  Samantha Briggs is a 89 y.o. year old female who sees Mliss Sax, MD for primary care.  Samantha Briggs daughter (DPR on file) of Samantha Briggs called by phone today to schedule for complex care management services.  Ms. Welford daughter Samantha Briggs [facility] on file was given information about Complex Care Management services today including:   The Complex Care Management services include support from the care team which includes your Nurse Coordinator, Clinical Social Worker, or Pharmacist.  The Complex Care Management team is here to help remove barriers to the health concerns and goals most important to you. Complex Care Management services are voluntary, and the patient may decline or stop services at any time by request to their care team member.   Complex Care Management Consent Status: Patient daughter Samantha Briggs DPR on file agreed to services and verbal consent obtained.   Follow up plan:  Telephone appointment with complex care management team member scheduled for:  02/07/23  Encounter Outcome:  Patient Scheduled  Gwenevere Ghazi  [facility]  [facility], [facility] Guide  Direct Dial: [phone]  Fax [phone]

## 2023-02-07 ENCOUNTER — Ambulatory Visit: Payer: Self-pay | Admitting: Licensed Clinical Social Worker

## 2023-02-07 NOTE — Patient Instructions (Signed)
Visit Information  Thank you for taking time to visit with me today. Please don't hesitate to contact me if I can be of assistance to you.   Following are the goals we discussed today:   Goals Addressed             This Visit's Progress    COMPLETED: Care Coordination       Care Coordination Interventions: Assessed Social Determinants of Health Reviewed all upcoming appointments in Epic system Motivational Interviewing employed Emotional Support Provided Problem Solving /Task Center strategies reviewed Discussed community alzheimer's resources with dtr Discuss pt's diagnosis with ALF and plan increasing level of care as needed           Please call the care guide team at [phone] if you need to cancel or reschedule your appointment.   If you are experiencing a Mental Health or Behavioral Health Crisis or need someone to talk to, please call the Suicide and Crisis Lifeline: 988  Patient verbalizes understanding of instructions and care plan provided today and agrees to view in MyChart. Active MyChart status and patient understanding of how to access instructions and care plan via MyChart confirmed with patient.     No further follow up required: Please contact care guide if you would like to reconnect.

## 2023-02-07 NOTE — Patient Outreach (Signed)
  Care Coordination   Initial Visit Note   02/07/2023 Name: Samantha Briggs MRN: [id number] DOB: [birth year]  Samantha Briggs is a 89 y.o. year old female who sees Berneta Elsie Sayre, MD for primary care. I  spoke with pt's dtr Samantha Briggs over the phone.  What matters to the patients health and wellness today?  Accessing community resources.    Goals Addressed             This Visit's Progress    COMPLETED: Care Coordination       Care Coordination Interventions: Assessed Social Determinants of Health Reviewed all upcoming appointments in Epic system Motivational Interviewing employed Emotional Support Provided Problem Solving /Task Center strategies reviewed Discussed community alzheimer's resources with dtr Discuss pt's diagnosis with ALF and plan increasing level of care as needed          SDOH assessments and interventions completed:  Yes  SDOH Interventions Today    Flowsheet Row Most Recent Value  SDOH Interventions   Food Insecurity Interventions Intervention Not Indicated  Housing Interventions Intervention Not Indicated  Transportation Interventions Intervention Not Indicated  Utilities Interventions Intervention Not Indicated        Care Coordination Interventions:  Yes, provided   Interventions Today    Flowsheet Row Most Recent Value  Chronic Disease   Chronic disease during today's visit Other  [Level of Care]  General Interventions   General Interventions Discussed/Reviewed Level of Care, Community Resources  [CSW and dtr discussed alzheimers community resources including support groups. CSW advised dtr to contact insurance for any additional benefits for alzheimers care.]  Level of Care Assisted Living  [Pt is currently staying at [facility] - plan is for pt to stay there longterm. Pt is currently staying in independent living and is maintaining there. CSW discussed communitcating with ALF pt diagnosis and planning increase in  LOC]        Follow up plan: No further intervention required.   Encounter Outcome:  Patient Visit Completed   Alm Armor, LCSW [facility], [facility] Licensed Clinical Social Worker Care Coordinator [phone]

## 2023-02-09 ENCOUNTER — Encounter: Payer: Self-pay | Admitting: Neurology

## 2023-02-14 ENCOUNTER — Other Ambulatory Visit: Payer: Self-pay | Admitting: Family Medicine

## 2023-02-14 DIAGNOSIS — I10 Essential (primary) hypertension: Secondary | ICD-10-CM

## 2023-02-15 ENCOUNTER — Telehealth: Payer: Self-pay | Admitting: Family Medicine

## 2023-02-15 NOTE — Telephone Encounter (Signed)
Patient dropped off document Handicap Placard, to be filled out by provider. Patient requested to send it back via Call Patient to pick up within 7-days. Document is located in providers tray at front office.Please advise at Mobile [phone] (mobile)   Pt daughter walked in with the pt placard. I put in the dr box

## 2023-02-20 ENCOUNTER — Telehealth: Payer: Self-pay

## 2023-02-20 NOTE — Telephone Encounter (Signed)
Handicap placement denied.

## 2023-02-21 ENCOUNTER — Other Ambulatory Visit: Payer: Self-pay | Admitting: Family Medicine

## 2023-02-21 DIAGNOSIS — Z8739 Personal history of other diseases of the musculoskeletal system and connective tissue: Secondary | ICD-10-CM

## 2023-02-28 ENCOUNTER — Other Ambulatory Visit: Payer: Self-pay | Admitting: Family Medicine

## 2023-02-28 DIAGNOSIS — Z8739 Personal history of other diseases of the musculoskeletal system and connective tissue: Secondary | ICD-10-CM

## 2023-03-24 ENCOUNTER — Other Ambulatory Visit: Payer: Self-pay | Admitting: Family Medicine

## 2023-03-24 DIAGNOSIS — E781 Pure hyperglyceridemia: Secondary | ICD-10-CM

## 2023-03-24 DIAGNOSIS — I10 Essential (primary) hypertension: Secondary | ICD-10-CM

## 2023-04-06 ENCOUNTER — Other Ambulatory Visit: Payer: Self-pay | Admitting: Family Medicine

## 2023-04-06 DIAGNOSIS — E559 Vitamin D deficiency, unspecified: Secondary | ICD-10-CM

## 2023-04-07 ENCOUNTER — Telehealth: Payer: Self-pay | Admitting: Family Medicine

## 2023-04-07 NOTE — Telephone Encounter (Signed)
 Copied from CRM [id number]. Topic: Medical Record Request - Other >> Apr 07, 2023  9:49 AM [place] J wrote: Reason for CRM: Thurston Hole calling in from [employer] regarding therapy orders that were sent through via fax on March 19th 2025. She will be resending paperwork for provider to fill out so patient can start treatment .

## 2023-04-10 ENCOUNTER — Other Ambulatory Visit: Payer: Self-pay | Admitting: Family Medicine

## 2023-04-10 DIAGNOSIS — K2100 Gastro-esophageal reflux disease with esophagitis, without bleeding: Secondary | ICD-10-CM

## 2023-04-25 ENCOUNTER — Other Ambulatory Visit: Payer: Self-pay | Admitting: Family Medicine

## 2023-04-25 DIAGNOSIS — R488 Other symbolic dysfunctions: Secondary | ICD-10-CM | POA: Diagnosis not present

## 2023-04-25 DIAGNOSIS — M6259 Muscle wasting and atrophy, not elsewhere classified, multiple sites: Secondary | ICD-10-CM | POA: Diagnosis not present

## 2023-04-25 DIAGNOSIS — R4789 Other speech disturbances: Secondary | ICD-10-CM | POA: Diagnosis not present

## 2023-04-25 DIAGNOSIS — I10 Essential (primary) hypertension: Secondary | ICD-10-CM

## 2023-04-25 DIAGNOSIS — R2689 Other abnormalities of gait and mobility: Secondary | ICD-10-CM | POA: Diagnosis not present

## 2023-04-25 DIAGNOSIS — E781 Pure hyperglyceridemia: Secondary | ICD-10-CM

## 2023-04-25 DIAGNOSIS — R2681 Unsteadiness on feet: Secondary | ICD-10-CM | POA: Diagnosis not present

## 2023-04-25 DIAGNOSIS — M62512 Muscle wasting and atrophy, not elsewhere classified, left shoulder: Secondary | ICD-10-CM | POA: Diagnosis not present

## 2023-04-25 DIAGNOSIS — R4185 Anosognosia: Secondary | ICD-10-CM | POA: Diagnosis not present

## 2023-04-25 DIAGNOSIS — M62511 Muscle wasting and atrophy, not elsewhere classified, right shoulder: Secondary | ICD-10-CM | POA: Diagnosis not present

## 2023-05-03 DIAGNOSIS — M62511 Muscle wasting and atrophy, not elsewhere classified, right shoulder: Secondary | ICD-10-CM | POA: Diagnosis not present

## 2023-05-03 DIAGNOSIS — R488 Other symbolic dysfunctions: Secondary | ICD-10-CM | POA: Diagnosis not present

## 2023-05-03 DIAGNOSIS — R2681 Unsteadiness on feet: Secondary | ICD-10-CM | POA: Diagnosis not present

## 2023-05-03 DIAGNOSIS — M62512 Muscle wasting and atrophy, not elsewhere classified, left shoulder: Secondary | ICD-10-CM | POA: Diagnosis not present

## 2023-05-03 DIAGNOSIS — M6259 Muscle wasting and atrophy, not elsewhere classified, multiple sites: Secondary | ICD-10-CM | POA: Diagnosis not present

## 2023-05-03 DIAGNOSIS — R2689 Other abnormalities of gait and mobility: Secondary | ICD-10-CM | POA: Diagnosis not present

## 2023-05-03 DIAGNOSIS — R4185 Anosognosia: Secondary | ICD-10-CM | POA: Diagnosis not present

## 2023-05-03 DIAGNOSIS — R4789 Other speech disturbances: Secondary | ICD-10-CM | POA: Diagnosis not present

## 2023-05-05 ENCOUNTER — Other Ambulatory Visit: Payer: Self-pay | Admitting: Family Medicine

## 2023-05-05 DIAGNOSIS — R2681 Unsteadiness on feet: Secondary | ICD-10-CM | POA: Diagnosis not present

## 2023-05-05 DIAGNOSIS — R4789 Other speech disturbances: Secondary | ICD-10-CM | POA: Diagnosis not present

## 2023-05-05 DIAGNOSIS — M62512 Muscle wasting and atrophy, not elsewhere classified, left shoulder: Secondary | ICD-10-CM | POA: Diagnosis not present

## 2023-05-05 DIAGNOSIS — M62511 Muscle wasting and atrophy, not elsewhere classified, right shoulder: Secondary | ICD-10-CM | POA: Diagnosis not present

## 2023-05-05 DIAGNOSIS — R488 Other symbolic dysfunctions: Secondary | ICD-10-CM | POA: Diagnosis not present

## 2023-05-05 DIAGNOSIS — R4185 Anosognosia: Secondary | ICD-10-CM | POA: Diagnosis not present

## 2023-05-05 DIAGNOSIS — I10 Essential (primary) hypertension: Secondary | ICD-10-CM

## 2023-05-08 DIAGNOSIS — R488 Other symbolic dysfunctions: Secondary | ICD-10-CM | POA: Diagnosis not present

## 2023-05-08 DIAGNOSIS — R4789 Other speech disturbances: Secondary | ICD-10-CM | POA: Diagnosis not present

## 2023-05-08 DIAGNOSIS — R4185 Anosognosia: Secondary | ICD-10-CM | POA: Diagnosis not present

## 2023-05-09 ENCOUNTER — Encounter: Payer: Self-pay | Admitting: Family Medicine

## 2023-05-09 ENCOUNTER — Ambulatory Visit (INDEPENDENT_AMBULATORY_CARE_PROVIDER_SITE_OTHER): Admitting: Family Medicine

## 2023-05-09 ENCOUNTER — Ambulatory Visit: Admitting: Family Medicine

## 2023-05-09 VITALS — BP 110/70 | HR 85 | Temp 97.4°F | Ht 66.0 in | Wt 183.0 lb

## 2023-05-09 DIAGNOSIS — M810 Age-related osteoporosis without current pathological fracture: Secondary | ICD-10-CM | POA: Diagnosis not present

## 2023-05-09 DIAGNOSIS — D509 Iron deficiency anemia, unspecified: Secondary | ICD-10-CM

## 2023-05-09 DIAGNOSIS — I10 Essential (primary) hypertension: Secondary | ICD-10-CM | POA: Diagnosis not present

## 2023-05-09 DIAGNOSIS — R4185 Anosognosia: Secondary | ICD-10-CM | POA: Diagnosis not present

## 2023-05-09 DIAGNOSIS — E781 Pure hyperglyceridemia: Secondary | ICD-10-CM

## 2023-05-09 DIAGNOSIS — R488 Other symbolic dysfunctions: Secondary | ICD-10-CM | POA: Diagnosis not present

## 2023-05-09 DIAGNOSIS — Z8739 Personal history of other diseases of the musculoskeletal system and connective tissue: Secondary | ICD-10-CM

## 2023-05-09 DIAGNOSIS — R4789 Other speech disturbances: Secondary | ICD-10-CM | POA: Diagnosis not present

## 2023-05-09 MED ORDER — FENOFIBRATE 160 MG PO TABS: 160.0000 mg | ORAL_TABLET | Freq: Every day | ORAL | 0 refills | Status: DC

## 2023-05-09 MED ORDER — ALENDRONATE SODIUM 70 MG PO TABS: 70.0000 mg | ORAL_TABLET | ORAL | 4 refills | Status: DC

## 2023-05-09 MED ORDER — OLMESARTAN MEDOXOMIL 40 MG PO TABS: 40.0000 mg | ORAL_TABLET | Freq: Every day | ORAL | 0 refills | Status: DC

## 2023-05-09 MED ORDER — FERROUS SULFATE 325 (65 FE) MG PO TABS: 325.0000 mg | ORAL_TABLET | Freq: Every day | ORAL | 5 refills | Status: AC

## 2023-05-09 NOTE — Progress Notes (Signed)
 Established Patient Office Visit   Subjective:  Patient ID: Samantha Briggs, female    DOB: 1932/05/06  Age: 89 y.o. MRN: [id number]  Chief Complaint  Patient presents with   Medical Management of Chronic Issues    6 month follow up. Pt is fasting.     HPI Encounter Diagnoses  Name Primary?   History of gout Yes   Iron deficiency anemia, unspecified iron deficiency anemia type    Age-related osteoporosis without current pathological fracture    Essential hypertension    Hypertriglyceridemia    For follow-up of above.  Accompanied by her daughter Irwin Manual.  Continues iron sulfate in AM.  No issues with constipation.  No issues taking allopurinol  or colchicine .  Has been taking Fosamax  since 2023.  Blood pressure well-controlled with olmesartan .  Continues fenofibrate  for elevated triglycerides.   Review of Systems  Constitutional:  Negative for chills, diaphoresis, malaise/fatigue and weight loss.  HENT: Negative.    Eyes: Negative.  Negative for blurred vision and double vision.  Cardiovascular:  Negative for chest pain.  Gastrointestinal:  Negative for abdominal pain.  Genitourinary: Negative.   Musculoskeletal:  Negative for falls and myalgias.  Neurological:  Negative for speech change, loss of consciousness and weakness.  Psychiatric/Behavioral: Negative.       Current Outpatient Medications:    allopurinol  (ZYLOPRIM ) 100 MG tablet, TAKE ONE TABLET BY MOUTH ONE TIME DAILY, Disp: 30 tablet, Rfl: 3   colchicine  0.6 MG tablet, TAKE ONE TABLET BY MOUTH ONE TIME DAILY, Disp: 30 tablet, Rfl: 2   cyanocobalamin  (VITAMIN B12) 1000 MCG tablet, Take 1 tablet (1,000 mcg total) by mouth daily. TAKE 1 TABLET (1,000 MCG TOTAL) BY MOUTH DAILY., Disp: 90 tablet, Rfl: 1   donepezil  (ARICEPT ) 5 MG tablet, Take 1 tablet (5 mg total) by mouth at bedtime., Disp: 30 tablet, Rfl: 3   dorzolamide (TRUSOPT) 2 % ophthalmic solution, , Disp: , Rfl: 11   fluticasone  (FLONASE ) 50 MCG/ACT nasal  spray, PLACE TWO SPRAYS INTO BOTH NOSTRILS DAILY, Disp: 16 mL, Rfl: 4   hydrochlorothiazide  (HYDRODIURIL ) 25 MG tablet, TAKE 1 TABLET BY MOUTH DAILY, Disp: 100 tablet, Rfl: 2   latanoprost (XALATAN) 0.005 % ophthalmic solution, Place 1 drop into the left eye daily. , Disp: , Rfl:    levocetirizine (XYZAL ) 5 MG tablet, TAKE 1 TABLET BY MOUTH IN THE  EVENING, Disp: 90 tablet, Rfl: 0   levothyroxine  (SYNTHROID ) 88 MCG tablet, Take 1 tablet (88 mcg total) by mouth daily., Disp: 90 tablet, Rfl: 3   methylcellulose oral powder, Dissolve 1 to 2 tablespoons in the liquid beverage daily as needed., Disp: 454 g, Rfl: 1   omeprazole  (PRILOSEC) 20 MG capsule, TAKE 1 CAPSULE BY MOUTH DAILY, Disp: 100 capsule, Rfl: 2   PRED MILD 0.12 % ophthalmic suspension, Place 1 drop into both eyes. , Disp: , Rfl:    sertraline  (ZOLOFT ) 100 MG tablet, TAKE 1 TABLET BY MOUTH DAILY, Disp: 90 tablet, Rfl: 3   Vitamin D , Ergocalciferol , (DRISDOL ) 1.25 MG (50000 UNIT) CAPS capsule, TAKE ONE CAPSULE BY MOUTH EVERY 7 DAYS, Disp: 15 capsule, Rfl: 1   alendronate  (FOSAMAX ) 70 MG tablet, Take 1 tablet (70 mg total) by mouth every 7 (seven) days. Take with a full glass of water on an empty stomach., Disp: 12 tablet, Rfl: 4   fenofibrate  160 MG tablet, Take 1 tablet (160 mg total) by mouth daily., Disp: 60 tablet, Rfl: 0   ferrous sulfate  325 (65 FE) MG tablet,  Take 1 tablet (325 mg total) by mouth daily with breakfast., Disp: 30 tablet, Rfl: 5   olmesartan  (BENICAR ) 40 MG tablet, Take 1 tablet (40 mg total) by mouth daily., Disp: 60 tablet, Rfl: 0   Objective:     BP 110/70 (Cuff Size: Normal)   Pulse 85   Temp (!) 97.4 F (36.3 C) (Temporal)   Ht 5\' 6"  (1.676 m)   Wt 183 lb (83 kg)   SpO2 97%   BMI 29.54 kg/m    Physical Exam Constitutional:      General: She is not in acute distress.    Appearance: Normal appearance. She is not ill-appearing, toxic-appearing or diaphoretic.  HENT:     Head: Normocephalic and  atraumatic.     Right Ear: External ear normal.     Left Ear: External ear normal.  Eyes:     General: No scleral icterus.       Right eye: No discharge.        Left eye: No discharge.     Extraocular Movements: Extraocular movements intact.     Conjunctiva/sclera: Conjunctivae normal.  Pulmonary:     Effort: Pulmonary effort is normal. No respiratory distress.  Skin:    General: Skin is warm and dry.  Neurological:     Mental Status: She is alert and oriented to person, place, and time.  Psychiatric:        Mood and Affect: Mood normal.        Behavior: Behavior normal.      No results found for any visits on 05/09/23.    The ASCVD Risk score (Arnett DK, et al., 2019) failed to calculate for the following reasons:   The 2019 ASCVD risk score is only valid for ages 24 to 71    Assessment & Plan:   History of gout -     Uric acid -     Comprehensive metabolic panel with GFR  Iron deficiency anemia, unspecified iron deficiency anemia type -     Ferrous Sulfate ; Take 1 tablet (325 mg total) by mouth daily with breakfast.  Dispense: 30 tablet; Refill: 5  Age-related osteoporosis without current pathological fracture -     Alendronate  Sodium; Take 1 tablet (70 mg total) by mouth every 7 (seven) days. Take with a full glass of water on an empty stomach.  Dispense: 12 tablet; Refill: 4  Essential hypertension -     Olmesartan  Medoxomil; Take 1 tablet (40 mg total) by mouth daily.  Dispense: 60 tablet; Refill: 0 -     Comprehensive metabolic panel with GFR  Hypertriglyceridemia -     Fenofibrate ; Take 1 tablet (160 mg total) by mouth daily.  Dispense: 60 tablet; Refill: 0 -     Lipid panel -     Comprehensive metabolic panel with GFR    Return in about 3 months (around 08/09/2023).  May have handicap sticker.  Cannot walk 100 feet without stopping to rest.  Tonna Frederic, MD

## 2023-05-10 DIAGNOSIS — R2681 Unsteadiness on feet: Secondary | ICD-10-CM | POA: Diagnosis not present

## 2023-05-10 DIAGNOSIS — M62512 Muscle wasting and atrophy, not elsewhere classified, left shoulder: Secondary | ICD-10-CM | POA: Diagnosis not present

## 2023-05-10 DIAGNOSIS — R488 Other symbolic dysfunctions: Secondary | ICD-10-CM | POA: Diagnosis not present

## 2023-05-10 DIAGNOSIS — M62511 Muscle wasting and atrophy, not elsewhere classified, right shoulder: Secondary | ICD-10-CM | POA: Diagnosis not present

## 2023-05-10 LAB — COMPREHENSIVE METABOLIC PANEL WITH GFR
ALT: 11 U/L (ref 0–35)
AST: 16 U/L (ref 0–37)
Albumin: 4.4 g/dL (ref 3.5–5.2)
Alkaline Phosphatase: 29 U/L — ABNORMAL LOW (ref 39–117)
BUN: 28 mg/dL — ABNORMAL HIGH (ref 6–23)
CO2: 27 meq/L (ref 19–32)
Calcium: 9.7 mg/dL (ref 8.4–10.5)
Chloride: 103 meq/L (ref 96–112)
Creatinine, Ser: 1.34 mg/dL — ABNORMAL HIGH (ref 0.40–1.20)
GFR: 35.44 mL/min — ABNORMAL LOW (ref >60.00)
Glucose, Bld: 87 mg/dL (ref 70–99)
Potassium: 4.2 meq/L (ref 3.5–5.1)
Sodium: 140 meq/L (ref 135–145)
Total Bilirubin: 0.4 mg/dL (ref 0.2–1.2)
Total Protein: 6.7 g/dL (ref 6.0–8.3)

## 2023-05-10 LAB — LIPID PANEL
Cholesterol: 228 mg/dL — ABNORMAL HIGH (ref 0–200)
HDL: 32.6 mg/dL — ABNORMAL LOW (ref >39.00)
LDL Cholesterol: 142 mg/dL — ABNORMAL HIGH (ref 0–99)
NonHDL: 195.52
Total CHOL/HDL Ratio: 7
Triglycerides: 270 mg/dL — ABNORMAL HIGH (ref 0.0–149.0)
VLDL: 54 mg/dL — ABNORMAL HIGH (ref 0.0–40.0)

## 2023-05-10 LAB — URIC ACID: Uric Acid, Serum: 9.5 mg/dL — ABNORMAL HIGH (ref 2.4–7.0)

## 2023-05-11 DIAGNOSIS — R4789 Other speech disturbances: Secondary | ICD-10-CM | POA: Diagnosis not present

## 2023-05-11 DIAGNOSIS — M62511 Muscle wasting and atrophy, not elsewhere classified, right shoulder: Secondary | ICD-10-CM | POA: Diagnosis not present

## 2023-05-11 DIAGNOSIS — R2681 Unsteadiness on feet: Secondary | ICD-10-CM | POA: Diagnosis not present

## 2023-05-11 DIAGNOSIS — R488 Other symbolic dysfunctions: Secondary | ICD-10-CM | POA: Diagnosis not present

## 2023-05-11 DIAGNOSIS — R4185 Anosognosia: Secondary | ICD-10-CM | POA: Diagnosis not present

## 2023-05-11 DIAGNOSIS — M62512 Muscle wasting and atrophy, not elsewhere classified, left shoulder: Secondary | ICD-10-CM | POA: Diagnosis not present

## 2023-05-12 DIAGNOSIS — M62511 Muscle wasting and atrophy, not elsewhere classified, right shoulder: Secondary | ICD-10-CM | POA: Diagnosis not present

## 2023-05-12 DIAGNOSIS — M62512 Muscle wasting and atrophy, not elsewhere classified, left shoulder: Secondary | ICD-10-CM | POA: Diagnosis not present

## 2023-05-12 DIAGNOSIS — M6259 Muscle wasting and atrophy, not elsewhere classified, multiple sites: Secondary | ICD-10-CM | POA: Diagnosis not present

## 2023-05-12 DIAGNOSIS — R2681 Unsteadiness on feet: Secondary | ICD-10-CM | POA: Diagnosis not present

## 2023-05-12 DIAGNOSIS — R2689 Other abnormalities of gait and mobility: Secondary | ICD-10-CM | POA: Diagnosis not present

## 2023-05-12 DIAGNOSIS — R488 Other symbolic dysfunctions: Secondary | ICD-10-CM | POA: Diagnosis not present

## 2023-05-14 ENCOUNTER — Other Ambulatory Visit: Payer: Self-pay | Admitting: Family Medicine

## 2023-05-14 DIAGNOSIS — E038 Other specified hypothyroidism: Secondary | ICD-10-CM

## 2023-05-16 DIAGNOSIS — R4185 Anosognosia: Secondary | ICD-10-CM | POA: Diagnosis not present

## 2023-05-16 DIAGNOSIS — R488 Other symbolic dysfunctions: Secondary | ICD-10-CM | POA: Diagnosis not present

## 2023-05-16 DIAGNOSIS — R4789 Other speech disturbances: Secondary | ICD-10-CM | POA: Diagnosis not present

## 2023-05-16 DIAGNOSIS — M62511 Muscle wasting and atrophy, not elsewhere classified, right shoulder: Secondary | ICD-10-CM | POA: Diagnosis not present

## 2023-05-16 DIAGNOSIS — R2681 Unsteadiness on feet: Secondary | ICD-10-CM | POA: Diagnosis not present

## 2023-05-16 DIAGNOSIS — M62512 Muscle wasting and atrophy, not elsewhere classified, left shoulder: Secondary | ICD-10-CM | POA: Diagnosis not present

## 2023-05-17 DIAGNOSIS — M6259 Muscle wasting and atrophy, not elsewhere classified, multiple sites: Secondary | ICD-10-CM | POA: Diagnosis not present

## 2023-05-17 DIAGNOSIS — R488 Other symbolic dysfunctions: Secondary | ICD-10-CM | POA: Diagnosis not present

## 2023-05-17 DIAGNOSIS — R4789 Other speech disturbances: Secondary | ICD-10-CM | POA: Diagnosis not present

## 2023-05-17 DIAGNOSIS — R4185 Anosognosia: Secondary | ICD-10-CM | POA: Diagnosis not present

## 2023-05-17 DIAGNOSIS — R2689 Other abnormalities of gait and mobility: Secondary | ICD-10-CM | POA: Diagnosis not present

## 2023-05-18 DIAGNOSIS — M62512 Muscle wasting and atrophy, not elsewhere classified, left shoulder: Secondary | ICD-10-CM | POA: Diagnosis not present

## 2023-05-18 DIAGNOSIS — R2681 Unsteadiness on feet: Secondary | ICD-10-CM | POA: Diagnosis not present

## 2023-05-18 DIAGNOSIS — M62511 Muscle wasting and atrophy, not elsewhere classified, right shoulder: Secondary | ICD-10-CM | POA: Diagnosis not present

## 2023-05-18 DIAGNOSIS — R2689 Other abnormalities of gait and mobility: Secondary | ICD-10-CM | POA: Diagnosis not present

## 2023-05-18 DIAGNOSIS — M6259 Muscle wasting and atrophy, not elsewhere classified, multiple sites: Secondary | ICD-10-CM | POA: Diagnosis not present

## 2023-05-18 DIAGNOSIS — R488 Other symbolic dysfunctions: Secondary | ICD-10-CM | POA: Diagnosis not present

## 2023-05-19 DIAGNOSIS — R488 Other symbolic dysfunctions: Secondary | ICD-10-CM | POA: Diagnosis not present

## 2023-05-19 DIAGNOSIS — R4185 Anosognosia: Secondary | ICD-10-CM | POA: Diagnosis not present

## 2023-05-19 DIAGNOSIS — R4789 Other speech disturbances: Secondary | ICD-10-CM | POA: Diagnosis not present

## 2023-05-22 DIAGNOSIS — R488 Other symbolic dysfunctions: Secondary | ICD-10-CM | POA: Diagnosis not present

## 2023-05-22 DIAGNOSIS — R4185 Anosognosia: Secondary | ICD-10-CM | POA: Diagnosis not present

## 2023-05-22 DIAGNOSIS — R4789 Other speech disturbances: Secondary | ICD-10-CM | POA: Diagnosis not present

## 2023-05-23 DIAGNOSIS — R2681 Unsteadiness on feet: Secondary | ICD-10-CM | POA: Diagnosis not present

## 2023-05-23 DIAGNOSIS — R4185 Anosognosia: Secondary | ICD-10-CM | POA: Diagnosis not present

## 2023-05-23 DIAGNOSIS — M62511 Muscle wasting and atrophy, not elsewhere classified, right shoulder: Secondary | ICD-10-CM | POA: Diagnosis not present

## 2023-05-23 DIAGNOSIS — R488 Other symbolic dysfunctions: Secondary | ICD-10-CM | POA: Diagnosis not present

## 2023-05-23 DIAGNOSIS — M62512 Muscle wasting and atrophy, not elsewhere classified, left shoulder: Secondary | ICD-10-CM | POA: Diagnosis not present

## 2023-05-23 DIAGNOSIS — R4789 Other speech disturbances: Secondary | ICD-10-CM | POA: Diagnosis not present

## 2023-05-24 ENCOUNTER — Other Ambulatory Visit: Payer: Self-pay | Admitting: Neurology

## 2023-05-24 ENCOUNTER — Other Ambulatory Visit: Payer: Self-pay

## 2023-05-24 DIAGNOSIS — R2689 Other abnormalities of gait and mobility: Secondary | ICD-10-CM | POA: Diagnosis not present

## 2023-05-24 DIAGNOSIS — M6259 Muscle wasting and atrophy, not elsewhere classified, multiple sites: Secondary | ICD-10-CM | POA: Diagnosis not present

## 2023-05-25 DIAGNOSIS — R2689 Other abnormalities of gait and mobility: Secondary | ICD-10-CM | POA: Diagnosis not present

## 2023-05-25 DIAGNOSIS — R488 Other symbolic dysfunctions: Secondary | ICD-10-CM | POA: Diagnosis not present

## 2023-05-25 DIAGNOSIS — M62511 Muscle wasting and atrophy, not elsewhere classified, right shoulder: Secondary | ICD-10-CM | POA: Diagnosis not present

## 2023-05-25 DIAGNOSIS — M62512 Muscle wasting and atrophy, not elsewhere classified, left shoulder: Secondary | ICD-10-CM | POA: Diagnosis not present

## 2023-05-25 DIAGNOSIS — R2681 Unsteadiness on feet: Secondary | ICD-10-CM | POA: Diagnosis not present

## 2023-05-25 DIAGNOSIS — M6259 Muscle wasting and atrophy, not elsewhere classified, multiple sites: Secondary | ICD-10-CM | POA: Diagnosis not present

## 2023-05-30 DIAGNOSIS — R4789 Other speech disturbances: Secondary | ICD-10-CM | POA: Diagnosis not present

## 2023-05-30 DIAGNOSIS — R488 Other symbolic dysfunctions: Secondary | ICD-10-CM | POA: Diagnosis not present

## 2023-05-30 DIAGNOSIS — M62511 Muscle wasting and atrophy, not elsewhere classified, right shoulder: Secondary | ICD-10-CM | POA: Diagnosis not present

## 2023-05-30 DIAGNOSIS — R2689 Other abnormalities of gait and mobility: Secondary | ICD-10-CM | POA: Diagnosis not present

## 2023-05-30 DIAGNOSIS — M62512 Muscle wasting and atrophy, not elsewhere classified, left shoulder: Secondary | ICD-10-CM | POA: Diagnosis not present

## 2023-05-30 DIAGNOSIS — R4185 Anosognosia: Secondary | ICD-10-CM | POA: Diagnosis not present

## 2023-05-30 DIAGNOSIS — M6259 Muscle wasting and atrophy, not elsewhere classified, multiple sites: Secondary | ICD-10-CM | POA: Diagnosis not present

## 2023-05-30 DIAGNOSIS — R2681 Unsteadiness on feet: Secondary | ICD-10-CM | POA: Diagnosis not present

## 2023-05-31 DIAGNOSIS — R4185 Anosognosia: Secondary | ICD-10-CM | POA: Diagnosis not present

## 2023-05-31 DIAGNOSIS — R4789 Other speech disturbances: Secondary | ICD-10-CM | POA: Diagnosis not present

## 2023-05-31 DIAGNOSIS — R488 Other symbolic dysfunctions: Secondary | ICD-10-CM | POA: Diagnosis not present

## 2023-06-02 DIAGNOSIS — R4185 Anosognosia: Secondary | ICD-10-CM | POA: Diagnosis not present

## 2023-06-02 DIAGNOSIS — R4789 Other speech disturbances: Secondary | ICD-10-CM | POA: Diagnosis not present

## 2023-06-02 DIAGNOSIS — R488 Other symbolic dysfunctions: Secondary | ICD-10-CM | POA: Diagnosis not present

## 2023-06-05 ENCOUNTER — Other Ambulatory Visit: Payer: Self-pay | Admitting: Family Medicine

## 2023-06-05 DIAGNOSIS — M6259 Muscle wasting and atrophy, not elsewhere classified, multiple sites: Secondary | ICD-10-CM | POA: Diagnosis not present

## 2023-06-05 DIAGNOSIS — R2689 Other abnormalities of gait and mobility: Secondary | ICD-10-CM | POA: Diagnosis not present

## 2023-06-05 DIAGNOSIS — M810 Age-related osteoporosis without current pathological fracture: Secondary | ICD-10-CM

## 2023-06-05 MED ORDER — ALENDRONATE SODIUM 70 MG PO TABS: 70.0000 mg | ORAL_TABLET | ORAL | 4 refills | Status: AC

## 2023-06-05 NOTE — Telephone Encounter (Signed)
 Last Fill: 05/09/23 Pt would like medication sent to mail order pharmacy  Last OV: 05/09/23 Next OV: 08/10/23  Routing to provider for review/authorization.

## 2023-06-05 NOTE — Telephone Encounter (Signed)
 Copied from CRM [id number]. Topic: Clinical - Medication Refill >> Jun 05, 2023 10:57 AM Juleen Oakland F wrote: Medication: alendronate  (FOSAMAX ) 70 MG tablet  Has the patient contacted their pharmacy? Yes (Agent: If no, request that the patient contact the pharmacy for the refill. If patient does not wish to contact the pharmacy document the reason why and proceed with request.) (Agent: If yes, when and what did the pharmacy advise?)  This is the patient's preferred pharmacy:  [facility] - [place], [place] - [address] [place] [address] Phone: [phone] Fax: [phone]  Is this the correct pharmacy for this prescription? Yes If no, delete pharmacy and type the correct one.   Has the prescription been filled recently? Yes  Is the patient out of the medication? No  Has the patient been seen for an appointment in the last year OR does the patient have an upcoming appointment? Yes  Can we respond through MyChart? No  Agent: Please be advised that Rx refills may take up to 3 business days. We ask that you follow-up with your pharmacy.

## 2023-06-07 DIAGNOSIS — R488 Other symbolic dysfunctions: Secondary | ICD-10-CM | POA: Diagnosis not present

## 2023-06-07 DIAGNOSIS — R4789 Other speech disturbances: Secondary | ICD-10-CM | POA: Diagnosis not present

## 2023-06-07 DIAGNOSIS — R4185 Anosognosia: Secondary | ICD-10-CM | POA: Diagnosis not present

## 2023-06-08 DIAGNOSIS — M6259 Muscle wasting and atrophy, not elsewhere classified, multiple sites: Secondary | ICD-10-CM | POA: Diagnosis not present

## 2023-06-08 DIAGNOSIS — R2689 Other abnormalities of gait and mobility: Secondary | ICD-10-CM | POA: Diagnosis not present

## 2023-06-12 DIAGNOSIS — R488 Other symbolic dysfunctions: Secondary | ICD-10-CM | POA: Diagnosis not present

## 2023-06-12 DIAGNOSIS — R4789 Other speech disturbances: Secondary | ICD-10-CM | POA: Diagnosis not present

## 2023-06-12 DIAGNOSIS — R4185 Anosognosia: Secondary | ICD-10-CM | POA: Diagnosis not present

## 2023-06-16 DIAGNOSIS — R4185 Anosognosia: Secondary | ICD-10-CM | POA: Diagnosis not present

## 2023-06-16 DIAGNOSIS — R488 Other symbolic dysfunctions: Secondary | ICD-10-CM | POA: Diagnosis not present

## 2023-06-16 DIAGNOSIS — R4789 Other speech disturbances: Secondary | ICD-10-CM | POA: Diagnosis not present

## 2023-06-19 DIAGNOSIS — R4789 Other speech disturbances: Secondary | ICD-10-CM | POA: Diagnosis not present

## 2023-06-19 DIAGNOSIS — R488 Other symbolic dysfunctions: Secondary | ICD-10-CM | POA: Diagnosis not present

## 2023-06-19 DIAGNOSIS — R4185 Anosognosia: Secondary | ICD-10-CM | POA: Diagnosis not present

## 2023-06-21 DIAGNOSIS — R4185 Anosognosia: Secondary | ICD-10-CM | POA: Diagnosis not present

## 2023-06-21 DIAGNOSIS — R4789 Other speech disturbances: Secondary | ICD-10-CM | POA: Diagnosis not present

## 2023-06-21 DIAGNOSIS — R488 Other symbolic dysfunctions: Secondary | ICD-10-CM | POA: Diagnosis not present

## 2023-06-27 DIAGNOSIS — R4185 Anosognosia: Secondary | ICD-10-CM | POA: Diagnosis not present

## 2023-06-27 DIAGNOSIS — R488 Other symbolic dysfunctions: Secondary | ICD-10-CM | POA: Diagnosis not present

## 2023-06-27 DIAGNOSIS — R4789 Other speech disturbances: Secondary | ICD-10-CM | POA: Diagnosis not present

## 2023-07-03 DIAGNOSIS — H401123 Primary open-angle glaucoma, left eye, severe stage: Secondary | ICD-10-CM | POA: Diagnosis not present

## 2023-07-28 ENCOUNTER — Other Ambulatory Visit: Payer: Self-pay | Admitting: Family Medicine

## 2023-07-28 DIAGNOSIS — F418 Other specified anxiety disorders: Secondary | ICD-10-CM

## 2023-08-10 ENCOUNTER — Ambulatory Visit: Admitting: Family Medicine

## 2023-08-17 ENCOUNTER — Ambulatory Visit: Admitting: Family Medicine

## 2023-08-24 ENCOUNTER — Other Ambulatory Visit: Payer: Self-pay | Admitting: Family Medicine

## 2023-08-24 DIAGNOSIS — I10 Essential (primary) hypertension: Secondary | ICD-10-CM

## 2023-08-26 ENCOUNTER — Other Ambulatory Visit: Payer: Self-pay | Admitting: Neurology

## 2023-09-14 ENCOUNTER — Encounter: Payer: Self-pay | Admitting: Family Medicine

## 2023-09-14 ENCOUNTER — Ambulatory Visit (INDEPENDENT_AMBULATORY_CARE_PROVIDER_SITE_OTHER): Admitting: Family Medicine

## 2023-09-14 VITALS — BP 124/72 | HR 94 | Temp 97.1°F | Ht 66.0 in | Wt 191.2 lb

## 2023-09-14 DIAGNOSIS — E781 Pure hyperglyceridemia: Secondary | ICD-10-CM

## 2023-09-14 DIAGNOSIS — E538 Deficiency of other specified B group vitamins: Secondary | ICD-10-CM

## 2023-09-14 DIAGNOSIS — R0982 Postnasal drip: Secondary | ICD-10-CM | POA: Diagnosis not present

## 2023-09-14 DIAGNOSIS — I10 Essential (primary) hypertension: Secondary | ICD-10-CM | POA: Diagnosis not present

## 2023-09-14 DIAGNOSIS — N1832 Chronic kidney disease, stage 3b: Secondary | ICD-10-CM

## 2023-09-14 DIAGNOSIS — E559 Vitamin D deficiency, unspecified: Secondary | ICD-10-CM

## 2023-09-14 DIAGNOSIS — Z8739 Personal history of other diseases of the musculoskeletal system and connective tissue: Secondary | ICD-10-CM | POA: Diagnosis not present

## 2023-09-14 DIAGNOSIS — Z23 Encounter for immunization: Secondary | ICD-10-CM | POA: Diagnosis not present

## 2023-09-14 DIAGNOSIS — E038 Other specified hypothyroidism: Secondary | ICD-10-CM

## 2023-09-14 MED ORDER — IPRATROPIUM BROMIDE 0.06 % NA SOLN: 2.0000 | Freq: Four times a day (QID) | NASAL | 12 refills | Status: AC

## 2023-09-14 MED ORDER — ALLOPURINOL 100 MG PO TABS: 200.0000 mg | ORAL_TABLET | Freq: Every day | ORAL | 1 refills | Status: DC

## 2023-09-14 NOTE — Progress Notes (Signed)
 Established Patient Office Visit   Subjective:  Patient ID: Samantha Briggs, female    DOB: [birth year]  Age: 89 y.o. MRN: [id number]  Chief Complaint  Patient presents with   Medical Management of Chronic Issues    3 month follow up. Flu shot today    HPI Encounter Diagnoses  Name Primary?   Stage 3b chronic kidney disease (HCC) Yes   History of gout    Vitamin D  deficiency    B12 deficiency    Hypertriglyceridemia    Other specified hypothyroidism    Immunization due    Essential hypertension    Post-nasal drip    For follow-up of above accompanied by her daughter Samantha Briggs.  Feels great and has been sleeping well.  Has been doing her best at increased hydration.  Blood pressure is well-controlled with current therapy.  Continues high-dose vitamin D  therapy.  Continues levothyroxine  for hypothyroidism.  She has been taking her fenofibrate  daily.  [name] organizes her pillbox.  An assistant at the facility supervises medication adherence.  Complains of ongoing postnasal drip despite therapy with Flonase  and Xyzal .   Review of Systems  Constitutional: Negative.   HENT:  Positive for congestion.   Eyes:  Negative for blurred vision, discharge and redness.  Respiratory: Negative.    Cardiovascular: Negative.   Gastrointestinal:  Negative for abdominal pain.  Genitourinary: Negative.   Musculoskeletal: Negative.  Negative for myalgias.  Skin:  Negative for rash.  Neurological:  Negative for tingling, loss of consciousness and weakness.  Endo/Heme/Allergies:  Negative for polydipsia.     Current Outpatient Medications:    alendronate  (FOSAMAX ) 70 MG tablet, Take 1 tablet (70 mg total) by mouth every 7 (seven) days. Take with a full glass of water on an empty stomach., Disp: 12 tablet, Rfl: 4   allopurinol  (ZYLOPRIM ) 100 MG tablet, Take 2 tablets (200 mg total) by mouth daily., Disp: 180 tablet, Rfl: 1   colchicine  0.6 MG tablet, TAKE ONE TABLET BY MOUTH ONE TIME DAILY, Disp:  30 tablet, Rfl: 2   cyanocobalamin  (VITAMIN B12) 1000 MCG tablet, Take 1 tablet (1,000 mcg total) by mouth daily. TAKE 1 TABLET (1,000 MCG TOTAL) BY MOUTH DAILY., Disp: 90 tablet, Rfl: 1   donepezil  (ARICEPT ) 5 MG tablet, TAKE ONE TABLET BY MOUTH AT BEDTIME, Disp: 30 tablet, Rfl: 2   dorzolamide (TRUSOPT) 2 % ophthalmic solution, , Disp: , Rfl: 11   fenofibrate  160 MG tablet, Take 1 tablet (160 mg total) by mouth daily., Disp: 60 tablet, Rfl: 0   ferrous sulfate  325 (65 FE) MG tablet, Take 1 tablet (325 mg total) by mouth daily with breakfast., Disp: 30 tablet, Rfl: 5   fluticasone  (FLONASE ) 50 MCG/ACT nasal spray, PLACE TWO SPRAYS INTO BOTH NOSTRILS DAILY, Disp: 16 mL, Rfl: 4   hydrochlorothiazide  (HYDRODIURIL ) 25 MG tablet, TAKE 1 TABLET BY MOUTH DAILY, Disp: 100 tablet, Rfl: 2   ipratropium (ATROVENT ) 0.06 % nasal spray, Place 2 sprays into both nostrils 4 (four) times daily., Disp: 15 mL, Rfl: 12   latanoprost (XALATAN) 0.005 % ophthalmic solution, Place 1 drop into the left eye daily. , Disp: , Rfl:    levocetirizine (XYZAL ) 5 MG tablet, TAKE 1 TABLET BY MOUTH IN THE  EVENING, Disp: 90 tablet, Rfl: 0   levothyroxine  (SYNTHROID ) 88 MCG tablet, TAKE 1 TABLET BY MOUTH DAILY, Disp: 100 tablet, Rfl: 2   methylcellulose oral powder, Dissolve 1 to 2 tablespoons in the liquid beverage daily as needed., Disp: 454 g,  Rfl: 1   olmesartan  (BENICAR ) 40 MG tablet, TAKE 1 TABLET BY MOUTH DAILY, Disp: 60 tablet, Rfl: 5   omeprazole  (PRILOSEC) 20 MG capsule, TAKE 1 CAPSULE BY MOUTH DAILY, Disp: 100 capsule, Rfl: 2   PRED MILD 0.12 % ophthalmic suspension, Place 1 drop into both eyes. , Disp: , Rfl:    sertraline  (ZOLOFT ) 100 MG tablet, TAKE 1 TABLET BY MOUTH DAILY, Disp: 90 tablet, Rfl: 3   Vitamin D , Ergocalciferol , (DRISDOL ) 1.25 MG (50000 UNIT) CAPS capsule, TAKE ONE CAPSULE BY MOUTH EVERY 7 DAYS, Disp: 15 capsule, Rfl: 1   Objective:     BP 124/72 (BP Location: Right Arm, Patient Position: Sitting,  Cuff Size: Normal)   Pulse 94   Temp (!) 97.1 F (36.2 C) (Temporal)   Ht 5' 6 (1.676 m)   Wt 191 lb 3.2 oz (86.7 kg)   SpO2 97%   BMI 30.86 kg/m    Physical Exam Constitutional:      General: She is not in acute distress.    Appearance: Normal appearance. She is not ill-appearing, toxic-appearing or diaphoretic.  HENT:     Head: Normocephalic and atraumatic.     Right Ear: External ear normal.     Left Ear: External ear normal.  Eyes:     General: No scleral icterus.       Right eye: No discharge.        Left eye: No discharge.     Extraocular Movements: Extraocular movements intact.     Conjunctiva/sclera: Conjunctivae normal.  Pulmonary:     Effort: Pulmonary effort is normal. No respiratory distress.  Skin:    General: Skin is warm and dry.  Neurological:     Mental Status: She is alert and oriented to person, place, and time.  Psychiatric:        Mood and Affect: Mood normal.        Behavior: Behavior normal.      No results found for any visits on 09/14/23.    The ASCVD Risk score (Arnett DK, et al., 2019) failed to calculate for the following reasons:   The 2019 ASCVD risk score is only valid for ages 26 to 32    Assessment & Plan:   Stage 3b chronic kidney disease (HCC) -     Basic metabolic panel with GFR  History of gout -     Allopurinol ; Take 2 tablets (200 mg total) by mouth daily.  Dispense: 180 tablet; Refill: 1  Vitamin D  deficiency -     VITAMIN D  25 Hydroxy (Vit-D Deficiency, Fractures)  B12 deficiency -     Vitamin B12  Hypertriglyceridemia  Other specified hypothyroidism -     TSH  Immunization due -     Flu vaccine HIGH DOSE PF(Fluzone Trivalent)  Essential hypertension -     Basic metabolic panel with GFR -     CBC  Post-nasal drip -     Ipratropium Bromide ; Place 2 sprays into both nostrils 4 (four) times daily.  Dispense: 15 mL; Refill: 12    Return in about 3 months (around 12/14/2023).  Continue Flonase  and  Xyzal .  Have added ipratropium nasal spray.  Medication adjustments made pending results of labs.  Have increased allopurinol  to 200 mg daily for further uric acid reduction.  Again encouraged physical activity and hydration.  Samantha Sim Lent, MD

## 2023-09-15 ENCOUNTER — Ambulatory Visit: Payer: Self-pay | Admitting: Family Medicine

## 2023-09-15 LAB — BASIC METABOLIC PANEL WITH GFR
BUN: 40 mg/dL — ABNORMAL HIGH (ref 6–23)
CO2: 24 meq/L (ref 19–32)
Calcium: 9.5 mg/dL (ref 8.4–10.5)
Chloride: 106 meq/L (ref 96–112)
Creatinine, Ser: 1.43 mg/dL — ABNORMAL HIGH (ref 0.40–1.20)
GFR: 32.7 mL/min — ABNORMAL LOW (ref >60.00)
Glucose, Bld: 103 mg/dL — ABNORMAL HIGH (ref 70–99)
Potassium: 4.2 meq/L (ref 3.5–5.1)
Sodium: 141 meq/L (ref 135–145)

## 2023-09-15 LAB — CBC
HCT: 37.6 % (ref 36.0–46.0)
Hemoglobin: 12.2 g/dL (ref 12.0–15.0)
MCHC: 32.4 g/dL (ref 30.0–36.0)
MCV: 88.9 fl (ref 78.0–100.0)
Platelets: 194 K/uL (ref 150.0–400.0)
RBC: 4.23 Mil/uL (ref 3.87–5.11)
RDW: 15 % (ref 11.5–15.5)
WBC: 6.1 K/uL (ref 4.0–10.5)

## 2023-09-15 LAB — VITAMIN B12: Vitamin B-12: 467 pg/mL (ref 211–911)

## 2023-09-15 LAB — VITAMIN D 25 HYDROXY (VIT D DEFICIENCY, FRACTURES): VITD: 34.63 ng/mL (ref 30.00–100.00)

## 2023-09-15 LAB — TSH: TSH: 2.39 u[IU]/mL (ref 0.35–5.50)

## 2023-09-15 NOTE — Addendum Note (Signed)
 Addended by: BERNETA ELSIE LABOR on: 09/15/2023 11:55 AM   Modules accepted: Orders

## 2023-09-17 ENCOUNTER — Other Ambulatory Visit: Payer: Self-pay | Admitting: Family Medicine

## 2023-09-17 DIAGNOSIS — E559 Vitamin D deficiency, unspecified: Secondary | ICD-10-CM

## 2023-10-16 ENCOUNTER — Other Ambulatory Visit: Payer: Self-pay | Admitting: Family Medicine

## 2023-10-16 DIAGNOSIS — E781 Pure hyperglyceridemia: Secondary | ICD-10-CM

## 2023-10-24 DIAGNOSIS — N1832 Chronic kidney disease, stage 3b: Secondary | ICD-10-CM | POA: Diagnosis not present

## 2023-10-24 DIAGNOSIS — M109 Gout, unspecified: Secondary | ICD-10-CM | POA: Diagnosis not present

## 2023-10-24 DIAGNOSIS — N390 Urinary tract infection, site not specified: Secondary | ICD-10-CM | POA: Diagnosis not present

## 2023-10-24 DIAGNOSIS — I129 Hypertensive chronic kidney disease with stage 1 through stage 4 chronic kidney disease, or unspecified chronic kidney disease: Secondary | ICD-10-CM | POA: Diagnosis not present

## 2023-10-27 ENCOUNTER — Other Ambulatory Visit: Payer: Self-pay | Admitting: Nephrology

## 2023-10-27 DIAGNOSIS — N1832 Chronic kidney disease, stage 3b: Secondary | ICD-10-CM

## 2023-11-02 ENCOUNTER — Ambulatory Visit
Admission: RE | Admit: 2023-11-02 | Discharge: 2023-11-02 | Disposition: A | Source: Ambulatory Visit | Attending: Nephrology

## 2023-11-02 DIAGNOSIS — N1832 Chronic kidney disease, stage 3b: Secondary | ICD-10-CM

## 2023-11-22 ENCOUNTER — Other Ambulatory Visit: Payer: Self-pay | Admitting: Family Medicine

## 2023-11-22 DIAGNOSIS — Z8739 Personal history of other diseases of the musculoskeletal system and connective tissue: Secondary | ICD-10-CM

## 2023-11-27 ENCOUNTER — Other Ambulatory Visit: Payer: Self-pay | Admitting: Neurology

## 2023-11-27 ENCOUNTER — Other Ambulatory Visit: Payer: Self-pay

## 2023-11-27 DIAGNOSIS — Z8739 Personal history of other diseases of the musculoskeletal system and connective tissue: Secondary | ICD-10-CM

## 2023-11-27 MED ORDER — COLCHICINE 0.6 MG PO TABS: 0.6000 mg | ORAL_TABLET | Freq: Every day | ORAL | 2 refills | Status: AC

## 2023-11-27 NOTE — Telephone Encounter (Signed)
 Last seen on 01/19/23 No follow up scheduled

## 2023-12-14 ENCOUNTER — Other Ambulatory Visit: Payer: Self-pay | Admitting: Neurology

## 2023-12-14 NOTE — Telephone Encounter (Signed)
 Last seen on 01/19/23 No follow up scheduled

## 2023-12-19 NOTE — Telephone Encounter (Signed)
 Pt has been scheduled. Please send a refill request for her donepezil  (ARICEPT ) 5 MG tablet to the [facility] on [facility].

## 2023-12-20 MED ORDER — DONEPEZIL HCL 5 MG PO TABS: 5.0000 mg | ORAL_TABLET | Freq: Every day | ORAL | 5 refills | Status: AC

## 2023-12-20 NOTE — Telephone Encounter (Signed)
 Refill sent

## 2024-01-07 ENCOUNTER — Other Ambulatory Visit: Payer: Self-pay | Admitting: Family Medicine

## 2024-01-07 DIAGNOSIS — K2100 Gastro-esophageal reflux disease with esophagitis, without bleeding: Secondary | ICD-10-CM

## 2024-01-07 DIAGNOSIS — E038 Other specified hypothyroidism: Secondary | ICD-10-CM

## 2024-07-22 ENCOUNTER — Ambulatory Visit: Admitting: Neurology
